# Patient Record
Sex: Male | Born: 1960 | Race: Black or African American | Hispanic: No | Marital: Single | Smoking: Current every day smoker
Health system: Southern US, Community
[De-identification: ages and names within clinical notes are randomized; demographics above are authoritative.]

## PROBLEM LIST (undated history)

## (undated) DIAGNOSIS — I10 Essential (primary) hypertension: Secondary | ICD-10-CM

## (undated) DIAGNOSIS — G40909 Epilepsy, unspecified, not intractable, without status epilepticus: Secondary | ICD-10-CM

## (undated) DIAGNOSIS — F1910 Other psychoactive substance abuse, uncomplicated: Secondary | ICD-10-CM

## (undated) DIAGNOSIS — B20 Human immunodeficiency virus [HIV] disease: Secondary | ICD-10-CM

## (undated) DIAGNOSIS — B1920 Unspecified viral hepatitis C without hepatic coma: Secondary | ICD-10-CM

## (undated) DIAGNOSIS — Q282 Arteriovenous malformation of cerebral vessels: Secondary | ICD-10-CM

## (undated) DIAGNOSIS — R569 Unspecified convulsions: Secondary | ICD-10-CM

## (undated) DIAGNOSIS — S1191XA Laceration without foreign body of unspecified part of neck, initial encounter: Secondary | ICD-10-CM

## (undated) DIAGNOSIS — Z21 Asymptomatic human immunodeficiency virus [HIV] infection status: Secondary | ICD-10-CM

## (undated) DIAGNOSIS — D849 Immunodeficiency, unspecified: Secondary | ICD-10-CM

## (undated) DIAGNOSIS — F1020 Alcohol dependence, uncomplicated: Secondary | ICD-10-CM

## (undated) DIAGNOSIS — F1010 Alcohol abuse, uncomplicated: Secondary | ICD-10-CM

## (undated) DIAGNOSIS — F1920 Other psychoactive substance dependence, uncomplicated: Secondary | ICD-10-CM

## (undated) DIAGNOSIS — F329 Major depressive disorder, single episode, unspecified: Secondary | ICD-10-CM

## (undated) HISTORY — DX: Major depressive disorder, single episode, unspecified: F32.9

## (undated) HISTORY — DX: Other psychoactive substance abuse, uncomplicated: F19.10

## (undated) HISTORY — DX: Laceration without foreign body of unspecified part of neck, initial encounter: S11.91XA

---

## 2003-08-15 ENCOUNTER — Ambulatory Visit (HOSPITAL_COMMUNITY): Admission: RE | Admit: 2003-08-15 | Discharge: 2003-08-15 | Payer: Self-pay | Admitting: Infectious Diseases

## 2003-08-15 ENCOUNTER — Encounter: Payer: Self-pay | Admitting: Infectious Diseases

## 2003-08-15 ENCOUNTER — Encounter: Admission: RE | Admit: 2003-08-15 | Discharge: 2003-08-15 | Payer: Self-pay | Admitting: Infectious Diseases

## 2003-09-24 ENCOUNTER — Emergency Department (HOSPITAL_COMMUNITY): Admission: EM | Admit: 2003-09-24 | Discharge: 2003-09-24 | Payer: Self-pay

## 2003-09-26 ENCOUNTER — Encounter: Admission: RE | Admit: 2003-09-26 | Discharge: 2003-09-26 | Payer: Self-pay | Admitting: Infectious Diseases

## 2003-11-07 ENCOUNTER — Encounter: Admission: RE | Admit: 2003-11-07 | Discharge: 2003-11-07 | Payer: Self-pay | Admitting: Infectious Diseases

## 2003-11-07 ENCOUNTER — Ambulatory Visit (HOSPITAL_COMMUNITY): Admission: RE | Admit: 2003-11-07 | Discharge: 2003-11-07 | Payer: Self-pay | Admitting: Infectious Diseases

## 2003-11-28 ENCOUNTER — Encounter: Payer: Self-pay | Admitting: Infectious Diseases

## 2003-11-28 ENCOUNTER — Encounter: Admission: RE | Admit: 2003-11-28 | Discharge: 2003-11-28 | Payer: Self-pay | Admitting: Infectious Diseases

## 2003-12-25 ENCOUNTER — Encounter: Admission: RE | Admit: 2003-12-25 | Discharge: 2003-12-25 | Payer: Self-pay | Admitting: Infectious Diseases

## 2004-01-30 ENCOUNTER — Ambulatory Visit (HOSPITAL_COMMUNITY): Admission: RE | Admit: 2004-01-30 | Discharge: 2004-01-30 | Payer: Self-pay | Admitting: Infectious Diseases

## 2004-01-30 ENCOUNTER — Encounter: Admission: RE | Admit: 2004-01-30 | Discharge: 2004-01-30 | Payer: Self-pay | Admitting: Infectious Diseases

## 2004-02-28 ENCOUNTER — Encounter: Admission: RE | Admit: 2004-02-28 | Discharge: 2004-02-28 | Payer: Self-pay | Admitting: Infectious Diseases

## 2004-04-03 ENCOUNTER — Emergency Department (HOSPITAL_COMMUNITY): Admission: EM | Admit: 2004-04-03 | Discharge: 2004-04-03 | Payer: Self-pay | Admitting: Emergency Medicine

## 2004-05-01 ENCOUNTER — Ambulatory Visit (HOSPITAL_COMMUNITY): Admission: RE | Admit: 2004-05-01 | Discharge: 2004-05-01 | Payer: Self-pay | Admitting: Infectious Diseases

## 2004-05-01 ENCOUNTER — Encounter: Admission: RE | Admit: 2004-05-01 | Discharge: 2004-05-01 | Payer: Self-pay | Admitting: Infectious Diseases

## 2004-05-15 ENCOUNTER — Ambulatory Visit: Payer: Self-pay | Admitting: Infectious Diseases

## 2004-09-02 ENCOUNTER — Ambulatory Visit: Payer: Self-pay | Admitting: Infectious Diseases

## 2004-09-02 ENCOUNTER — Ambulatory Visit (HOSPITAL_COMMUNITY): Admission: RE | Admit: 2004-09-02 | Discharge: 2004-09-02 | Payer: Self-pay | Admitting: Infectious Diseases

## 2004-09-17 ENCOUNTER — Ambulatory Visit: Payer: Self-pay | Admitting: Infectious Diseases

## 2004-12-10 ENCOUNTER — Ambulatory Visit (HOSPITAL_COMMUNITY): Admission: RE | Admit: 2004-12-10 | Discharge: 2004-12-10 | Payer: Self-pay | Admitting: Infectious Diseases

## 2004-12-10 ENCOUNTER — Ambulatory Visit: Payer: Self-pay | Admitting: Infectious Diseases

## 2004-12-24 ENCOUNTER — Ambulatory Visit: Payer: Self-pay | Admitting: Infectious Diseases

## 2005-03-08 ENCOUNTER — Ambulatory Visit (HOSPITAL_COMMUNITY): Admission: RE | Admit: 2005-03-08 | Discharge: 2005-03-08 | Payer: Self-pay | Admitting: Infectious Diseases

## 2005-03-08 ENCOUNTER — Ambulatory Visit: Payer: Self-pay | Admitting: Infectious Diseases

## 2005-03-10 ENCOUNTER — Emergency Department (HOSPITAL_COMMUNITY): Admission: EM | Admit: 2005-03-10 | Discharge: 2005-03-10 | Payer: Self-pay | Admitting: Family Medicine

## 2005-03-15 ENCOUNTER — Emergency Department (HOSPITAL_COMMUNITY): Admission: EM | Admit: 2005-03-15 | Discharge: 2005-03-15 | Payer: Self-pay | Admitting: Emergency Medicine

## 2005-03-25 ENCOUNTER — Ambulatory Visit: Payer: Self-pay | Admitting: Infectious Diseases

## 2005-04-28 ENCOUNTER — Emergency Department (HOSPITAL_COMMUNITY): Admission: EM | Admit: 2005-04-28 | Discharge: 2005-04-28 | Payer: Self-pay | Admitting: Family Medicine

## 2005-06-10 ENCOUNTER — Ambulatory Visit (HOSPITAL_COMMUNITY): Admission: RE | Admit: 2005-06-10 | Discharge: 2005-06-10 | Payer: Self-pay | Admitting: Infectious Diseases

## 2005-06-10 ENCOUNTER — Ambulatory Visit: Payer: Self-pay | Admitting: Infectious Diseases

## 2005-07-22 ENCOUNTER — Ambulatory Visit: Payer: Self-pay | Admitting: Infectious Diseases

## 2005-10-08 ENCOUNTER — Ambulatory Visit: Payer: Self-pay | Admitting: Infectious Diseases

## 2005-10-21 ENCOUNTER — Ambulatory Visit: Payer: Self-pay | Admitting: Infectious Diseases

## 2005-10-26 ENCOUNTER — Ambulatory Visit: Payer: Self-pay | Admitting: Internal Medicine

## 2005-12-14 ENCOUNTER — Encounter (INDEPENDENT_AMBULATORY_CARE_PROVIDER_SITE_OTHER): Payer: Self-pay | Admitting: *Deleted

## 2005-12-14 ENCOUNTER — Encounter: Admission: RE | Admit: 2005-12-14 | Discharge: 2005-12-14 | Payer: Self-pay | Admitting: Infectious Diseases

## 2005-12-14 ENCOUNTER — Ambulatory Visit: Payer: Self-pay | Admitting: Infectious Diseases

## 2005-12-14 LAB — CONVERTED CEMR LAB: CD4 Count: 350 microliters

## 2006-01-03 ENCOUNTER — Ambulatory Visit: Payer: Self-pay | Admitting: Infectious Diseases

## 2006-03-18 ENCOUNTER — Ambulatory Visit: Payer: Self-pay | Admitting: Family Medicine

## 2006-03-24 ENCOUNTER — Ambulatory Visit: Payer: Self-pay | Admitting: Internal Medicine

## 2006-03-25 ENCOUNTER — Ambulatory Visit: Payer: Self-pay | Admitting: *Deleted

## 2006-04-04 ENCOUNTER — Encounter: Admission: RE | Admit: 2006-04-04 | Discharge: 2006-04-04 | Payer: Self-pay | Admitting: Infectious Diseases

## 2006-04-04 ENCOUNTER — Encounter (INDEPENDENT_AMBULATORY_CARE_PROVIDER_SITE_OTHER): Payer: Self-pay | Admitting: *Deleted

## 2006-04-04 ENCOUNTER — Ambulatory Visit: Payer: Self-pay | Admitting: Infectious Diseases

## 2006-04-21 ENCOUNTER — Ambulatory Visit: Payer: Self-pay | Admitting: Infectious Diseases

## 2006-08-08 ENCOUNTER — Encounter (INDEPENDENT_AMBULATORY_CARE_PROVIDER_SITE_OTHER): Payer: Self-pay | Admitting: *Deleted

## 2006-08-08 ENCOUNTER — Ambulatory Visit: Payer: Self-pay | Admitting: Infectious Diseases

## 2006-08-08 ENCOUNTER — Encounter: Admission: RE | Admit: 2006-08-08 | Discharge: 2006-08-08 | Payer: Self-pay | Admitting: Infectious Diseases

## 2006-08-08 LAB — CONVERTED CEMR LAB
CD4 Count: 340 microliters
HIV 1 RNA Quant: 253 copies/mL

## 2006-08-25 ENCOUNTER — Ambulatory Visit: Payer: Self-pay | Admitting: Infectious Diseases

## 2006-10-31 ENCOUNTER — Encounter (INDEPENDENT_AMBULATORY_CARE_PROVIDER_SITE_OTHER): Payer: Self-pay | Admitting: *Deleted

## 2006-10-31 LAB — CONVERTED CEMR LAB

## 2006-12-02 ENCOUNTER — Emergency Department (HOSPITAL_COMMUNITY): Admission: EM | Admit: 2006-12-02 | Discharge: 2006-12-02 | Payer: Self-pay | Admitting: Emergency Medicine

## 2006-12-19 ENCOUNTER — Telehealth: Payer: Self-pay | Admitting: Infectious Diseases

## 2007-01-13 ENCOUNTER — Telehealth: Payer: Self-pay | Admitting: Infectious Diseases

## 2007-02-14 ENCOUNTER — Telehealth: Payer: Self-pay | Admitting: Infectious Diseases

## 2007-03-03 ENCOUNTER — Encounter: Admission: RE | Admit: 2007-03-03 | Discharge: 2007-03-03 | Payer: Self-pay | Admitting: Infectious Diseases

## 2007-03-03 ENCOUNTER — Ambulatory Visit: Payer: Self-pay | Admitting: Infectious Diseases

## 2007-03-17 ENCOUNTER — Telehealth: Payer: Self-pay | Admitting: Infectious Diseases

## 2007-03-28 ENCOUNTER — Ambulatory Visit: Payer: Self-pay | Admitting: Infectious Diseases

## 2007-03-28 DIAGNOSIS — I10 Essential (primary) hypertension: Secondary | ICD-10-CM | POA: Insufficient documentation

## 2007-03-28 DIAGNOSIS — Z9189 Other specified personal risk factors, not elsewhere classified: Secondary | ICD-10-CM | POA: Insufficient documentation

## 2007-03-28 DIAGNOSIS — B20 Human immunodeficiency virus [HIV] disease: Secondary | ICD-10-CM | POA: Insufficient documentation

## 2007-03-28 DIAGNOSIS — B1920 Unspecified viral hepatitis C without hepatic coma: Secondary | ICD-10-CM | POA: Insufficient documentation

## 2007-03-30 ENCOUNTER — Encounter (INDEPENDENT_AMBULATORY_CARE_PROVIDER_SITE_OTHER): Payer: Self-pay | Admitting: *Deleted

## 2007-04-07 ENCOUNTER — Encounter: Payer: Self-pay | Admitting: Infectious Diseases

## 2007-04-18 ENCOUNTER — Telehealth: Payer: Self-pay | Admitting: Infectious Diseases

## 2007-05-10 ENCOUNTER — Telehealth: Payer: Self-pay | Admitting: Infectious Diseases

## 2007-05-31 ENCOUNTER — Telehealth: Payer: Self-pay | Admitting: Infectious Diseases

## 2007-06-07 ENCOUNTER — Ambulatory Visit: Payer: Self-pay | Admitting: Infectious Diseases

## 2007-06-07 ENCOUNTER — Encounter: Admission: RE | Admit: 2007-06-07 | Discharge: 2007-06-07 | Payer: Self-pay | Admitting: Infectious Diseases

## 2007-06-07 LAB — CONVERTED CEMR LAB
AST: 77 units/L — ABNORMAL HIGH (ref 0–37)
Albumin: 4.2 g/dL (ref 3.5–5.2)
Alkaline Phosphatase: 57 units/L (ref 39–117)
Basophils Relative: 1 % (ref 0–1)
Calcium: 8.9 mg/dL (ref 8.4–10.5)
Chloride: 103 meq/L (ref 96–112)
Eosinophils Absolute: 0.1 10*3/uL (ref 0.0–0.7)
Lymphs Abs: 1.9 10*3/uL (ref 0.7–3.3)
MCHC: 33.6 g/dL (ref 30.0–36.0)
MCV: 87 fL (ref 78.0–100.0)
Monocytes Relative: 12 % — ABNORMAL HIGH (ref 3–11)
Neutro Abs: 2.1 10*3/uL (ref 1.7–7.7)
Neutrophils Relative %: 45 % (ref 43–77)
Platelets: 316 10*3/uL (ref 150–400)
Potassium: 4.2 meq/L (ref 3.5–5.3)
RBC: 4.86 M/uL (ref 4.22–5.81)
Sodium: 137 meq/L (ref 135–145)
Total Protein: 7.9 g/dL (ref 6.0–8.3)
WBC: 4.7 10*3/uL (ref 4.0–10.5)

## 2007-06-14 ENCOUNTER — Telehealth: Payer: Self-pay | Admitting: Infectious Diseases

## 2007-07-07 ENCOUNTER — Ambulatory Visit: Payer: Self-pay | Admitting: Infectious Diseases

## 2007-07-07 LAB — CONVERTED CEMR LAB
LDL Cholesterol: 120 mg/dL — ABNORMAL HIGH (ref 0–99)
Total CHOL/HDL Ratio: 6
VLDL: 39 mg/dL (ref 0–40)

## 2007-07-17 ENCOUNTER — Telehealth: Payer: Self-pay | Admitting: Infectious Diseases

## 2007-08-15 ENCOUNTER — Telehealth: Payer: Self-pay | Admitting: Infectious Diseases

## 2007-09-05 ENCOUNTER — Encounter (INDEPENDENT_AMBULATORY_CARE_PROVIDER_SITE_OTHER): Payer: Self-pay | Admitting: *Deleted

## 2007-09-15 ENCOUNTER — Telehealth: Payer: Self-pay | Admitting: Infectious Diseases

## 2007-09-18 ENCOUNTER — Encounter: Payer: Self-pay | Admitting: Infectious Diseases

## 2007-09-26 ENCOUNTER — Emergency Department (HOSPITAL_COMMUNITY): Admission: EM | Admit: 2007-09-26 | Discharge: 2007-09-26 | Payer: Self-pay | Admitting: Family Medicine

## 2007-10-17 ENCOUNTER — Telehealth: Payer: Self-pay | Admitting: Infectious Diseases

## 2007-11-13 ENCOUNTER — Encounter (INDEPENDENT_AMBULATORY_CARE_PROVIDER_SITE_OTHER): Payer: Self-pay | Admitting: *Deleted

## 2007-11-16 ENCOUNTER — Telehealth: Payer: Self-pay | Admitting: Infectious Diseases

## 2007-12-13 ENCOUNTER — Telehealth (INDEPENDENT_AMBULATORY_CARE_PROVIDER_SITE_OTHER): Payer: Self-pay | Admitting: *Deleted

## 2008-01-12 ENCOUNTER — Telehealth (INDEPENDENT_AMBULATORY_CARE_PROVIDER_SITE_OTHER): Payer: Self-pay | Admitting: *Deleted

## 2008-01-19 ENCOUNTER — Ambulatory Visit: Payer: Self-pay | Admitting: Infectious Diseases

## 2008-01-19 ENCOUNTER — Encounter: Admission: RE | Admit: 2008-01-19 | Discharge: 2008-01-19 | Payer: Self-pay | Admitting: Infectious Diseases

## 2008-01-19 LAB — CONVERTED CEMR LAB
Albumin: 4.1 g/dL (ref 3.5–5.2)
Alkaline Phosphatase: 60 units/L (ref 39–117)
Calcium: 8.8 mg/dL (ref 8.4–10.5)
Chloride: 105 meq/L (ref 96–112)
Eosinophils Absolute: 0.1 10*3/uL (ref 0.0–0.7)
Glucose, Bld: 108 mg/dL — ABNORMAL HIGH (ref 70–99)
HIV-1 RNA Quant, Log: 2.57 — ABNORMAL HIGH (ref <1.70)
LDL Cholesterol: 114 mg/dL — ABNORMAL HIGH (ref 0–99)
Lymphocytes Relative: 40 % (ref 12–46)
Lymphs Abs: 1.8 10*3/uL (ref 0.7–4.0)
MCV: 87.2 fL (ref 78.0–100.0)
Neutro Abs: 2 10*3/uL (ref 1.7–7.7)
Neutrophils Relative %: 46 % (ref 43–77)
Platelets: 284 10*3/uL (ref 150–400)
Potassium: 4.1 meq/L (ref 3.5–5.3)
RBC: 4.93 M/uL (ref 4.22–5.81)
Sodium: 140 meq/L (ref 135–145)
Total Protein: 7.8 g/dL (ref 6.0–8.3)
Triglycerides: 199 mg/dL — ABNORMAL HIGH (ref <150)
WBC: 4.5 10*3/uL (ref 4.0–10.5)

## 2008-02-08 ENCOUNTER — Ambulatory Visit: Payer: Self-pay | Admitting: Infectious Diseases

## 2008-02-09 ENCOUNTER — Telehealth (INDEPENDENT_AMBULATORY_CARE_PROVIDER_SITE_OTHER): Payer: Self-pay | Admitting: *Deleted

## 2008-03-07 ENCOUNTER — Telehealth (INDEPENDENT_AMBULATORY_CARE_PROVIDER_SITE_OTHER): Payer: Self-pay | Admitting: *Deleted

## 2008-04-08 ENCOUNTER — Encounter (INDEPENDENT_AMBULATORY_CARE_PROVIDER_SITE_OTHER): Payer: Self-pay | Admitting: *Deleted

## 2008-04-10 ENCOUNTER — Telehealth (INDEPENDENT_AMBULATORY_CARE_PROVIDER_SITE_OTHER): Payer: Self-pay | Admitting: *Deleted

## 2008-05-07 ENCOUNTER — Telehealth (INDEPENDENT_AMBULATORY_CARE_PROVIDER_SITE_OTHER): Payer: Self-pay | Admitting: *Deleted

## 2008-05-15 ENCOUNTER — Ambulatory Visit: Payer: Self-pay | Admitting: Infectious Diseases

## 2008-05-15 LAB — CONVERTED CEMR LAB
Albumin: 4.3 g/dL (ref 3.5–5.2)
CO2: 24 meq/L (ref 19–32)
Calcium: 9.3 mg/dL (ref 8.4–10.5)
Glucose, Bld: 91 mg/dL (ref 70–99)
HCT: 44.2 % (ref 39.0–52.0)
HIV 1 RNA Quant: 200 copies/mL — ABNORMAL HIGH (ref <50)
HIV-1 RNA Quant, Log: 2.3 — ABNORMAL HIGH (ref <1.70)
Lymphocytes Relative: 41 % (ref 12–46)
Lymphs Abs: 1.9 10*3/uL (ref 0.7–4.0)
Neutrophils Relative %: 44 % (ref 43–77)
Platelets: 274 10*3/uL (ref 150–400)
Potassium: 4.4 meq/L (ref 3.5–5.3)
RBC: 5.1 M/uL (ref 4.22–5.81)
Sodium: 140 meq/L (ref 135–145)
Total Protein: 8.1 g/dL (ref 6.0–8.3)
WBC: 4.7 10*3/uL (ref 4.0–10.5)

## 2008-05-30 ENCOUNTER — Ambulatory Visit: Payer: Self-pay | Admitting: Infectious Diseases

## 2008-06-03 ENCOUNTER — Telehealth: Payer: Self-pay | Admitting: Infectious Diseases

## 2008-06-06 ENCOUNTER — Telehealth (INDEPENDENT_AMBULATORY_CARE_PROVIDER_SITE_OTHER): Payer: Self-pay | Admitting: *Deleted

## 2008-06-24 ENCOUNTER — Telehealth: Payer: Self-pay | Admitting: Infectious Diseases

## 2008-07-04 ENCOUNTER — Telehealth (INDEPENDENT_AMBULATORY_CARE_PROVIDER_SITE_OTHER): Payer: Self-pay | Admitting: *Deleted

## 2008-07-17 ENCOUNTER — Telehealth (INDEPENDENT_AMBULATORY_CARE_PROVIDER_SITE_OTHER): Payer: Self-pay | Admitting: *Deleted

## 2008-07-20 ENCOUNTER — Emergency Department (HOSPITAL_COMMUNITY): Admission: EM | Admit: 2008-07-20 | Discharge: 2008-07-20 | Payer: Self-pay | Admitting: Emergency Medicine

## 2008-08-07 ENCOUNTER — Telehealth (INDEPENDENT_AMBULATORY_CARE_PROVIDER_SITE_OTHER): Payer: Self-pay | Admitting: *Deleted

## 2008-09-03 ENCOUNTER — Telehealth (INDEPENDENT_AMBULATORY_CARE_PROVIDER_SITE_OTHER): Payer: Self-pay | Admitting: *Deleted

## 2008-09-17 ENCOUNTER — Telehealth (INDEPENDENT_AMBULATORY_CARE_PROVIDER_SITE_OTHER): Payer: Self-pay | Admitting: *Deleted

## 2008-10-04 ENCOUNTER — Telehealth (INDEPENDENT_AMBULATORY_CARE_PROVIDER_SITE_OTHER): Payer: Self-pay | Admitting: *Deleted

## 2008-10-07 ENCOUNTER — Emergency Department (HOSPITAL_COMMUNITY): Admission: EM | Admit: 2008-10-07 | Discharge: 2008-10-07 | Payer: Self-pay | Admitting: Emergency Medicine

## 2008-10-09 ENCOUNTER — Emergency Department (HOSPITAL_COMMUNITY): Admission: EM | Admit: 2008-10-09 | Discharge: 2008-10-09 | Payer: Self-pay | Admitting: Emergency Medicine

## 2008-10-09 ENCOUNTER — Encounter (INDEPENDENT_AMBULATORY_CARE_PROVIDER_SITE_OTHER): Payer: Self-pay | Admitting: *Deleted

## 2008-10-22 ENCOUNTER — Encounter (INDEPENDENT_AMBULATORY_CARE_PROVIDER_SITE_OTHER): Payer: Self-pay | Admitting: Licensed Clinical Social Worker

## 2008-10-22 ENCOUNTER — Ambulatory Visit: Payer: Self-pay | Admitting: Infectious Diseases

## 2008-10-22 LAB — CONVERTED CEMR LAB
ALT: 62 units/L — ABNORMAL HIGH (ref 0–53)
Basophils Absolute: 0 10*3/uL (ref 0.0–0.1)
CO2: 25 meq/L (ref 19–32)
Eosinophils Relative: 2 % (ref 0–5)
HIV 1 RNA Quant: 223 copies/mL — ABNORMAL HIGH (ref <48)
Lymphocytes Relative: 39 % (ref 12–46)
Neutro Abs: 2.2 10*3/uL (ref 1.7–7.7)
Platelets: 356 10*3/uL (ref 150–400)
RDW: 12.5 % (ref 11.5–15.5)
Sodium: 138 meq/L (ref 135–145)
Total Bilirubin: 0.4 mg/dL (ref 0.3–1.2)
Total Protein: 8.1 g/dL (ref 6.0–8.3)

## 2008-10-31 ENCOUNTER — Telehealth (INDEPENDENT_AMBULATORY_CARE_PROVIDER_SITE_OTHER): Payer: Self-pay | Admitting: *Deleted

## 2008-11-07 ENCOUNTER — Ambulatory Visit: Payer: Self-pay | Admitting: Infectious Diseases

## 2008-11-27 ENCOUNTER — Telehealth (INDEPENDENT_AMBULATORY_CARE_PROVIDER_SITE_OTHER): Payer: Self-pay | Admitting: *Deleted

## 2008-11-27 ENCOUNTER — Emergency Department (HOSPITAL_COMMUNITY): Admission: EM | Admit: 2008-11-27 | Discharge: 2008-11-27 | Payer: Self-pay | Admitting: Family Medicine

## 2008-12-30 ENCOUNTER — Telehealth (INDEPENDENT_AMBULATORY_CARE_PROVIDER_SITE_OTHER): Payer: Self-pay | Admitting: *Deleted

## 2009-01-03 ENCOUNTER — Telehealth (INDEPENDENT_AMBULATORY_CARE_PROVIDER_SITE_OTHER): Payer: Self-pay | Admitting: *Deleted

## 2009-01-09 ENCOUNTER — Telehealth (INDEPENDENT_AMBULATORY_CARE_PROVIDER_SITE_OTHER): Payer: Self-pay | Admitting: *Deleted

## 2009-01-30 ENCOUNTER — Telehealth (INDEPENDENT_AMBULATORY_CARE_PROVIDER_SITE_OTHER): Payer: Self-pay | Admitting: *Deleted

## 2009-03-04 ENCOUNTER — Telehealth (INDEPENDENT_AMBULATORY_CARE_PROVIDER_SITE_OTHER): Payer: Self-pay | Admitting: *Deleted

## 2009-03-18 ENCOUNTER — Telehealth (INDEPENDENT_AMBULATORY_CARE_PROVIDER_SITE_OTHER): Payer: Self-pay | Admitting: *Deleted

## 2009-03-24 ENCOUNTER — Telehealth (INDEPENDENT_AMBULATORY_CARE_PROVIDER_SITE_OTHER): Payer: Self-pay | Admitting: *Deleted

## 2009-03-25 ENCOUNTER — Telehealth (INDEPENDENT_AMBULATORY_CARE_PROVIDER_SITE_OTHER): Payer: Self-pay | Admitting: *Deleted

## 2009-04-24 ENCOUNTER — Telehealth (INDEPENDENT_AMBULATORY_CARE_PROVIDER_SITE_OTHER): Payer: Self-pay | Admitting: *Deleted

## 2009-04-25 ENCOUNTER — Telehealth: Payer: Self-pay | Admitting: Infectious Diseases

## 2009-04-28 ENCOUNTER — Telehealth (INDEPENDENT_AMBULATORY_CARE_PROVIDER_SITE_OTHER): Payer: Self-pay | Admitting: *Deleted

## 2009-05-14 ENCOUNTER — Encounter (INDEPENDENT_AMBULATORY_CARE_PROVIDER_SITE_OTHER): Payer: Self-pay | Admitting: *Deleted

## 2009-05-23 ENCOUNTER — Telehealth (INDEPENDENT_AMBULATORY_CARE_PROVIDER_SITE_OTHER): Payer: Self-pay | Admitting: *Deleted

## 2009-06-19 ENCOUNTER — Ambulatory Visit: Payer: Self-pay | Admitting: Infectious Diseases

## 2009-06-19 LAB — CONVERTED CEMR LAB
AST: 86 units/L — ABNORMAL HIGH (ref 0–37)
BUN: 16 mg/dL (ref 6–23)
Basophils Absolute: 0 10*3/uL (ref 0.0–0.1)
Basophils Relative: 1 % (ref 0–1)
CO2: 22 meq/L (ref 19–32)
Calcium: 8.7 mg/dL (ref 8.4–10.5)
Chloride: 105 meq/L (ref 96–112)
Creatinine, Ser: 1 mg/dL (ref 0.40–1.50)
Eosinophils Absolute: 0.1 10*3/uL (ref 0.0–0.7)
Glucose, Bld: 74 mg/dL (ref 70–99)
HIV 1 RNA Quant: 48 copies/mL — ABNORMAL HIGH (ref <48)
HIV-1 RNA Quant, Log: 1.68 — ABNORMAL HIGH (ref <1.68)
MCHC: 31.6 g/dL (ref 30.0–36.0)
MCV: 89.3 fL (ref >=78.0)
Neutro Abs: 2.2 10*3/uL (ref 1.7–7.7)
Neutrophils Relative %: 46 % (ref 43–77)
Platelets: 297 10*3/uL (ref 150–400)
RDW: 12.6 % (ref 11.5–15.5)
Total CHOL/HDL Ratio: 4.8

## 2009-06-20 ENCOUNTER — Telehealth (INDEPENDENT_AMBULATORY_CARE_PROVIDER_SITE_OTHER): Payer: Self-pay | Admitting: *Deleted

## 2009-06-25 ENCOUNTER — Telehealth (INDEPENDENT_AMBULATORY_CARE_PROVIDER_SITE_OTHER): Payer: Self-pay | Admitting: *Deleted

## 2009-07-03 ENCOUNTER — Ambulatory Visit: Payer: Self-pay | Admitting: Infectious Diseases

## 2009-07-23 ENCOUNTER — Telehealth (INDEPENDENT_AMBULATORY_CARE_PROVIDER_SITE_OTHER): Payer: Self-pay | Admitting: *Deleted

## 2009-08-22 ENCOUNTER — Telehealth: Payer: Self-pay | Admitting: Infectious Diseases

## 2009-08-22 ENCOUNTER — Telehealth (INDEPENDENT_AMBULATORY_CARE_PROVIDER_SITE_OTHER): Payer: Self-pay | Admitting: *Deleted

## 2009-09-15 ENCOUNTER — Telehealth (INDEPENDENT_AMBULATORY_CARE_PROVIDER_SITE_OTHER): Payer: Self-pay | Admitting: *Deleted

## 2009-09-23 ENCOUNTER — Encounter (INDEPENDENT_AMBULATORY_CARE_PROVIDER_SITE_OTHER): Payer: Self-pay | Admitting: *Deleted

## 2009-10-10 ENCOUNTER — Telehealth (INDEPENDENT_AMBULATORY_CARE_PROVIDER_SITE_OTHER): Payer: Self-pay | Admitting: *Deleted

## 2009-10-14 ENCOUNTER — Emergency Department (HOSPITAL_COMMUNITY): Admission: EM | Admit: 2009-10-14 | Discharge: 2009-10-14 | Payer: Self-pay | Admitting: Family Medicine

## 2009-10-24 ENCOUNTER — Telehealth (INDEPENDENT_AMBULATORY_CARE_PROVIDER_SITE_OTHER): Payer: Self-pay | Admitting: *Deleted

## 2009-11-06 ENCOUNTER — Ambulatory Visit: Payer: Self-pay | Admitting: Infectious Diseases

## 2009-11-06 ENCOUNTER — Telehealth (INDEPENDENT_AMBULATORY_CARE_PROVIDER_SITE_OTHER): Payer: Self-pay | Admitting: *Deleted

## 2009-11-06 LAB — CONVERTED CEMR LAB
ALT: 82 units/L — ABNORMAL HIGH (ref 0–53)
Alkaline Phosphatase: 61 units/L (ref 39–117)
Basophils Relative: 1 % (ref 0–1)
CO2: 25 meq/L (ref 19–32)
Lymphocytes Relative: 41 % (ref 12–46)
MCHC: 31.4 g/dL (ref 30.0–36.0)
Monocytes Relative: 10 % (ref 3–12)
Neutro Abs: 2 10*3/uL (ref 1.7–7.7)
Neutrophils Relative %: 46 % (ref 43–77)
RBC: 4.72 M/uL (ref 4.22–5.81)
Sodium: 137 meq/L (ref 135–145)
Total Bilirubin: 0.4 mg/dL (ref 0.3–1.2)
Total Protein: 7.8 g/dL (ref 6.0–8.3)
WBC: 4.4 10*3/uL (ref 4.0–10.5)

## 2009-11-20 ENCOUNTER — Ambulatory Visit: Payer: Self-pay | Admitting: Infectious Diseases

## 2009-11-25 ENCOUNTER — Emergency Department (HOSPITAL_COMMUNITY): Admission: EM | Admit: 2009-11-25 | Discharge: 2009-11-25 | Payer: Self-pay | Admitting: Family Medicine

## 2009-11-27 ENCOUNTER — Telehealth (INDEPENDENT_AMBULATORY_CARE_PROVIDER_SITE_OTHER): Payer: Self-pay | Admitting: *Deleted

## 2009-12-11 ENCOUNTER — Telehealth (INDEPENDENT_AMBULATORY_CARE_PROVIDER_SITE_OTHER): Payer: Self-pay | Admitting: *Deleted

## 2009-12-15 ENCOUNTER — Telehealth (INDEPENDENT_AMBULATORY_CARE_PROVIDER_SITE_OTHER): Payer: Self-pay | Admitting: *Deleted

## 2009-12-31 ENCOUNTER — Telehealth (INDEPENDENT_AMBULATORY_CARE_PROVIDER_SITE_OTHER): Payer: Self-pay | Admitting: *Deleted

## 2010-01-28 ENCOUNTER — Telehealth (INDEPENDENT_AMBULATORY_CARE_PROVIDER_SITE_OTHER): Payer: Self-pay | Admitting: *Deleted

## 2010-02-10 ENCOUNTER — Telehealth (INDEPENDENT_AMBULATORY_CARE_PROVIDER_SITE_OTHER): Payer: Self-pay | Admitting: *Deleted

## 2010-02-16 ENCOUNTER — Telehealth (INDEPENDENT_AMBULATORY_CARE_PROVIDER_SITE_OTHER): Payer: Self-pay | Admitting: *Deleted

## 2010-02-25 ENCOUNTER — Encounter (INDEPENDENT_AMBULATORY_CARE_PROVIDER_SITE_OTHER): Payer: Self-pay | Admitting: *Deleted

## 2010-02-25 ENCOUNTER — Ambulatory Visit: Payer: Self-pay | Admitting: Infectious Diseases

## 2010-02-25 LAB — CONVERTED CEMR LAB
Albumin: 4.1 g/dL (ref 3.5–5.2)
Alkaline Phosphatase: 53 units/L (ref 39–117)
BUN: 22 mg/dL (ref 6–23)
Basophils Absolute: 0 10*3/uL (ref 0.0–0.1)
Basophils Relative: 1 % (ref 0–1)
Eosinophils Relative: 2 % (ref 0–5)
Glucose, Bld: 86 mg/dL (ref 70–99)
HCT: 39.4 % (ref 39.0–52.0)
HDL: 35 mg/dL — ABNORMAL LOW (ref >39)
Hemoglobin: 13 g/dL (ref 13.0–17.0)
LDL Cholesterol: 117 mg/dL — ABNORMAL HIGH (ref 0–99)
MCHC: 33 g/dL (ref 30.0–36.0)
Monocytes Absolute: 0.5 10*3/uL (ref 0.1–1.0)
RDW: 13 % (ref 11.5–15.5)
Total Bilirubin: 0.5 mg/dL (ref 0.3–1.2)
Triglycerides: 152 mg/dL — ABNORMAL HIGH (ref <150)
VLDL: 30 mg/dL (ref 0–40)

## 2010-03-10 ENCOUNTER — Telehealth (INDEPENDENT_AMBULATORY_CARE_PROVIDER_SITE_OTHER): Payer: Self-pay | Admitting: *Deleted

## 2010-03-12 ENCOUNTER — Ambulatory Visit: Payer: Self-pay | Admitting: Infectious Diseases

## 2010-03-20 LAB — CONVERTED CEMR LAB: Testosterone: 505.37 ng/dL (ref 350–890)

## 2010-04-03 ENCOUNTER — Telehealth (INDEPENDENT_AMBULATORY_CARE_PROVIDER_SITE_OTHER): Payer: Self-pay | Admitting: *Deleted

## 2010-05-05 ENCOUNTER — Telehealth (INDEPENDENT_AMBULATORY_CARE_PROVIDER_SITE_OTHER): Payer: Self-pay | Admitting: *Deleted

## 2010-05-13 ENCOUNTER — Telehealth (INDEPENDENT_AMBULATORY_CARE_PROVIDER_SITE_OTHER): Payer: Self-pay | Admitting: *Deleted

## 2010-06-01 ENCOUNTER — Telehealth (INDEPENDENT_AMBULATORY_CARE_PROVIDER_SITE_OTHER): Payer: Self-pay | Admitting: *Deleted

## 2010-06-08 ENCOUNTER — Encounter: Payer: Self-pay | Admitting: Infectious Diseases

## 2010-06-08 ENCOUNTER — Encounter (INDEPENDENT_AMBULATORY_CARE_PROVIDER_SITE_OTHER): Payer: Self-pay | Admitting: *Deleted

## 2010-06-15 ENCOUNTER — Ambulatory Visit: Payer: Self-pay | Admitting: Infectious Diseases

## 2010-06-15 LAB — CONVERTED CEMR LAB
BUN: 22 mg/dL (ref 6–23)
Basophils Absolute: 0 10*3/uL (ref 0.0–0.1)
CO2: 27 meq/L (ref 19–32)
Creatinine, Ser: 1 mg/dL (ref 0.40–1.50)
Eosinophils Relative: 3 % (ref 0–5)
Glucose, Bld: 80 mg/dL (ref 70–99)
HCT: 39.6 % (ref 39.0–52.0)
HIV 1 RNA Quant: <20 copies/mL (ref <20)
HIV-1 RNA Quant, Log: <1.3 (ref <1.30)
Hemoglobin: 13 g/dL (ref 13.0–17.0)
Lymphocytes Relative: 54 % — ABNORMAL HIGH (ref 12–46)
Monocytes Absolute: 0.5 10*3/uL (ref 0.1–1.0)
Monocytes Relative: 11 % (ref 3–12)
RDW: 12.2 % (ref 11.5–15.5)
Total Bilirubin: 0.5 mg/dL (ref 0.3–1.2)

## 2010-06-23 ENCOUNTER — Encounter: Payer: Self-pay | Admitting: Infectious Diseases

## 2010-06-29 ENCOUNTER — Telehealth (INDEPENDENT_AMBULATORY_CARE_PROVIDER_SITE_OTHER): Payer: Self-pay | Admitting: *Deleted

## 2010-07-02 ENCOUNTER — Ambulatory Visit: Payer: Self-pay | Admitting: Infectious Diseases

## 2010-07-17 ENCOUNTER — Telehealth (INDEPENDENT_AMBULATORY_CARE_PROVIDER_SITE_OTHER): Payer: Self-pay | Admitting: *Deleted

## 2010-07-17 ENCOUNTER — Ambulatory Visit: Payer: Self-pay | Admitting: Infectious Diseases

## 2010-07-27 ENCOUNTER — Encounter (INDEPENDENT_AMBULATORY_CARE_PROVIDER_SITE_OTHER): Payer: Self-pay | Admitting: *Deleted

## 2010-08-24 ENCOUNTER — Telehealth: Payer: Self-pay | Admitting: *Deleted

## 2010-09-17 ENCOUNTER — Ambulatory Visit: Admit: 2010-09-17 | Payer: Self-pay | Admitting: Infectious Diseases

## 2010-09-28 ENCOUNTER — Encounter (INDEPENDENT_AMBULATORY_CARE_PROVIDER_SITE_OTHER): Payer: Self-pay | Admitting: *Deleted

## 2010-10-04 ENCOUNTER — Emergency Department (HOSPITAL_COMMUNITY)
Admission: EM | Admit: 2010-10-04 | Discharge: 2010-10-04 | Payer: Federal, State, Local not specified - Other | Source: Home / Self Care | Admitting: Emergency Medicine

## 2010-10-04 LAB — CONVERTED CEMR LAB
ALT: 153 units/L — ABNORMAL HIGH (ref 0–53)
ALT: 200 units/L — ABNORMAL HIGH (ref 0–53)
BUN: 15 mg/dL (ref 6–23)
Basophils Absolute: 0 10*3/uL (ref 0.0–0.1)
Basophils Relative: 1 % (ref 0–1)
CO2: 24 meq/L (ref 19–32)
CO2: 25 meq/L (ref 19–32)
Calcium: 9.2 mg/dL (ref 8.4–10.5)
Chloride: 105 meq/L (ref 96–112)
Creatinine, Ser: 1.07 mg/dL (ref 0.40–1.50)
Creatinine, Ser: 1.08 mg/dL (ref 0.40–1.50)
Eosinophils Relative: 2 % (ref 0–5)
Glucose, Bld: 106 mg/dL — ABNORMAL HIGH (ref 70–99)
HCT: 45.8 % (ref 39.0–52.0)
HIV 1 RNA Quant: 253 copies/mL — ABNORMAL HIGH (ref <50)
HIV 1 RNA Quant: <50 copies/mL (ref <50)
HIV-1 RNA Quant, Log: <1.7 (ref <1.70)
Hemoglobin: 14.5 g/dL (ref 13.9–16.8)
Hemoglobin: 15 g/dL (ref 13.0–17.0)
Lymphocytes Relative: 42 % (ref 12–46)
Lymphs Abs: 1.9 10*3/uL (ref 0.7–3.3)
Lymphs Abs: 2 10*3/uL (ref 0.8–3.1)
MCHC: 32.7 g/dL — ABNORMAL LOW (ref 33.1–35.4)
MCV: 87.9 fL (ref 78.8–100.0)
Monocytes Absolute: 0.4 10*3/uL (ref 0.2–0.7)
Monocytes Absolute: 0.4 10*3/uL (ref 0.2–0.7)
Monocytes Relative: 8 % (ref 3–11)
Monocytes Relative: 9 % (ref 3–11)
Neutro Abs: 1.6 10*3/uL — ABNORMAL LOW (ref 1.8–6.8)
Neutro Abs: 2.2 10*3/uL (ref 1.7–7.7)
RBC: 5.05 M/uL (ref 4.20–5.50)
RBC: 5.3 M/uL (ref 4.22–5.81)
Total Bilirubin: 0.5 mg/dL (ref 0.3–1.2)
Total Bilirubin: 0.5 mg/dL (ref 0.3–1.2)
WBC: 4.6 10*3/uL (ref 4.0–10.5)

## 2010-10-06 NOTE — Progress Notes (Signed)
Summary: Cough w/ phlegm, RN advised OTC Mucinex or Robitussin  Phone Note Call from Patient Call back at [facility] Phone [phone]   Caller: Patient Reason for Call: Acute Illness Summary of Call: Message left by pt.  Having "phlegm" following respiratory illness.  Wondering whether he should be taking septra or bactrim.  RN returned call.  Message left for pt. to obtain OTC Mucinex or Robitussin and take per the package instructions.  RN advised also advised increasing the amount of fluids he is drinking to keep secretions thin and easy to expectorate. Jennet Maduro RN  October 24, 2009 4:33 PM

## 2010-10-06 NOTE — Miscellaneous (Signed)
Summary: clinical update/ryan white [place]ADAP approved til 12/05/10  Clinical Lists Changes  Observations: Added new observation of AIDSDAP: Yes 2011 (09/23/2009 11:46)

## 2010-10-06 NOTE — Progress Notes (Signed)
Summary: [place]ADAP/pt assist med arrived for Jul  Phone Note Refill Request      Prescriptions: ATRIPLA 600-200-300 MG TABS (EFAVIRENZ-EMTRICITAB-TENOFOVIR) Take 1 tablet by mouth at bedtime  #30 x 0   Entered by:   Paulo Fruit  BS,CPht II,MPH   Authorized by:   Lina Sayre MD   Signed by:   Paulo Fruit  BS,CPht II,MPH on 03/10/2010   Method used:   Samples Given   RxID:   [id number]  Patient Assist Medication Verification: Medication name: Atripla RX # [id number] Tech approval:MLD Tried to contact patient.  Was unable to get through. Could not leave a message because no answering machine came on. Paulo Fruit  BS,CPht II,MPH  March 10, 2010 2:16 PM

## 2010-10-06 NOTE — Miscellaneous (Signed)
Summary: viagra thru [employer]  Clinical Lists Changes found app dated 08/28/09 for [employer] to get free Viagra. presumably he will have to apply again before 12/11 to continue getting the meds.Golden Circle RN  June 23, 2010 7:51 AM

## 2010-10-06 NOTE — Letter (Signed)
Summary: Juanell Fairly Application  Ryan White Application   Imported By: Florinda Marker 06/12/2010 16:18:57  _____________________________________________________________________  External Attachment:    Type:   Image     Comment:   External Document

## 2010-10-06 NOTE — Progress Notes (Signed)
Summary: NCADAP/pt assist med arrived for Apr/May  Phone Note Refill Request      Prescriptions: ATRIPLA 600-200-300 MG TABS (EFAVIRENZ-EMTRICITAB-TENOFOVIR) Take 1 tablet by mouth at bedtime  #30 x 0   Entered by:   Paulo Fruit  BS,CPht II,MPH   Authorized by:   Lina Sayre MD   Signed by:   Paulo Fruit  BS,CPht II,MPH on 12/31/2009   Method used:   Samples Given   RxID:   [id number]   Patient Assist Medication Verification: Medication: Atripla Lot# [id number] Exp Date:08 2013 Tech approval:MLD tried to contact patient at number on file. Unable to leave a message. No answering machine available at the time of the call. Paulo Fruit  BS,CPht II,MPH  December 31, 2009 3:13 PM

## 2010-10-06 NOTE — Progress Notes (Signed)
Summary: Patient assistance med arrived via PAP 3 months  Phone Note Refill Request      Prescriptions: VIAGRA 100 MG TABS (SILDENAFIL CITRATE) Take as directed  #30 x 0   Entered by:   Paulo Fruit  BS,CPht II,MPH   Authorized by:   Lina Sayre MD   Signed by:   Paulo Fruit  BS,CPht II,MPH on 09/15/2009   Method used:   Samples Given   RxID:   [id number]   Patient Assist Medication Verification: Medication: Viagra 100mg  FAO#[id number] Exp Date:01 Aug 15 Tech approval:MLD **Patient called right when I was about to call him**  He stated he will be here today to pickup at 3pm Paulo Fruit  BS,CPht II,MPH  September 15, 2009 2:15 PM

## 2010-10-06 NOTE — Miscellaneous (Signed)
Summary: refill - B/P rx  Clinical Lists Changes  Medications: Removed medication of BENAZEPRIL-HYDROCHLOROTHIAZIDE 10-12.5 MG TABS (BENAZEPRIL-HYDROCHLOROTHIAZIDE) Take 1 tablet by mouth once a day Added new medication of BENAZEPRIL HCL 10 MG TABS (BENAZEPRIL HCL) Take 1 tablet by mouth once a day - Signed Added new medication of HYDROCHLOROTHIAZIDE 12.5 MG CAPS (HYDROCHLOROTHIAZIDE) Take 1 capsule by mouth once a day - Signed Rx of HYDROCHLOROTHIAZIDE 12.5 MG CAPS (HYDROCHLOROTHIAZIDE) Take 1 capsule by mouth once a day;  #30 x 11;  Signed;  Entered by: Jennet Maduro RN;  Authorized by: Lina Sayre MD;  Method used: Telephoned to [facility], [address], [place], [place]  [address], Ph: [phone], Fax: [phone] Rx of BENAZEPRIL HCL 10 MG TABS (BENAZEPRIL HCL) Take 1 tablet by mouth once a day;  #30 x 11;  Signed;  Entered by: Jennet Maduro RN;  Authorized by: Lina Sayre MD;  Method used: Telephoned to [facility], [address], [place], [place]  [address], Ph: [phone], Fax: [phone]    Prescriptions: BENAZEPRIL HCL 10 MG TABS (BENAZEPRIL HCL) Take 1 tablet by mouth once a day  #30 x 11   Entered by:   Jennet Maduro RN   Authorized by:   Lina Sayre MD   Signed by:   Jennet Maduro RN on 02/25/2010   Method used:   Telephoned to ...       [place] [facility] (retail)       [address]       [place], [place]  [address]       Ph: [phone]       Fax: [phone]   RxID:   [id number] HYDROCHLOROTHIAZIDE 12.5 MG CAPS (HYDROCHLOROTHIAZIDE) Take 1 capsule by mouth once a day  #30 x 11   Entered by:   Jennet Maduro RN   Authorized by:   Lina Sayre MD   Signed by:   Jennet Maduro RN on 02/25/2010   Method used:   Telephoned to ...       [facility] [facility] (retail)       [address]       [place], [place]   [address]       Ph: [phone]       Fax: [phone]   RxID:   [id number]

## 2010-10-06 NOTE — Progress Notes (Signed)
Summary: Pt. notified ADAP med. arrived  Phone Note Outgoing Call   Call placed by: Annice Pih Summary of Call: Pt. notified that ADAP med. arrived for October, Atripla Initial call taken by: Wendall Mola CMA Duncan Dull),  June 29, 2010 11:26 AM     Appended Document: Pt. notified ADAP med. arrived pt. picked up ADAP meds

## 2010-10-06 NOTE — Progress Notes (Signed)
Summary: NCADAP/pt assist med arrived for May  Phone Note Refill Request      Prescriptions: ATRIPLA 600-200-300 MG TABS (EFAVIRENZ-EMTRICITAB-TENOFOVIR) Take 1 tablet by mouth at bedtime  #30 x 0   Entered by:   Paulo Fruit  BS,CPht II,MPH   Authorized by:   Lina Sayre MD   Signed by:   Paulo Fruit  BS,CPht II,MPH on 01/28/2010   Method used:   Samples Given   RxID:   [id number]   Patient Assist Medication Verification: Medication: Atripla Lot# [id number] Exp Date:10 2013 Tech approval:MLD Call placed to patient with message that assistance medications are ready for pick-up. Paulo Fruit  BS,CPht II,MPH  Jan 28, 2010 11:37 AM

## 2010-10-06 NOTE — Assessment & Plan Note (Signed)
Summary: F/U/VS   CC:  follow-up visit, right arm shoulder pain, wrist pain, shooting electrical pains, and difficulty holding  cups in his right hand at times.  History of Present Illness: Isaac [name] is doing well overall and adherent to his ARVs. He has been off his BPmeds for one week buthis BP is 140/90 today and will restart in few days. HIV 155 and CD4 400. Although not suppressed he is stable over time and possibly best that can be done. His LFTs are just slightly elevated and will await newer HCV drugs that are under study. Isaac [name] has had tingling in his forearms bilaterally over the past month with some discomfort but onlly has occurred few times. I amnot sure if this is neuropathy or not or muscular cramps. Will follow this complaint at next visit.   Preventive Screening-Counseling & Management  Alcohol-Tobacco     Alcohol drinks/day: 0     Smoking Status: quit     Year Quit: 2007     Pack years: 1 ppd     Passive Smoke Exposure: no  Caffeine-Diet-Exercise     Caffeine use/day: 4     Does Patient Exercise: no  Hep-HIV-STD-Contraception     HIV Risk: no     HIV Risk Counseling: not indicated-no HIV risk noted  Safety-Violence-Falls     Seat Belt Use: 90  Comments: condoms declined      Sexual History:  currently monogamous.        Drug Use:  former.     Current Allergies (reviewed today): No known allergies  Social History: Sexual History:  currently monogamous Drug Use:  former  Vital Signs:  Patient profile:   50 year old male Height:      69 inches (175.26 cm) Weight:      188.1 pounds (85.50 kg) BMI:     27.88 Temp:     97.5 degrees F (36.39 degrees C) oral Pulse rate:   81 / minute BP sitting:   146 / 90  (left arm) Cuff size:   regular  Vitals Entered By: Jennet Maduro RN (November 20, 2009 10:52 AM) CC: follow-up visit, right arm shoulder pain, wrist pain, shooting electrical pains, difficulty holding  cups in his right hand at times Is Patient  Diabetic? No Pain Assessment Patient in pain? yes     Location: right shoulder and wrist Intensity: 3 Type: shooting, electrical pain Onset of pain  Intermittent Nutritional Status BMI of 25 - 29 = overweight Nutritional Status Detail appetite "normal"  Have you ever been in a relationship where you felt threatened, hurt or afraid?No   Does patient need assistance? Functional Status Self care Ambulation Normal Comments rarely miss a dose of Atripla   Physical Exam  General:  alert, well-developed, and well-nourished.   Eyes:  No corneal or conjunctival inflammation noted. EOMI. Perrla. Funduscopic exam benign, without hemorrhages, exudates or papilledema. Vision grossly normal. Mouth:  Oral mucosa and oropharynx without lesions or exudates.  Teeth in good repair. Neck:  No deformities, masses, or tenderness noted. Lungs:  Normal respiratory effort, chest expands symmetrically. Lungs are clear to auscultation, no crackles or wheezes. Heart:  Normal rate and regular rhythm. S1 and S2 normal without gallop, murmur, click, rub or other extra sounds. Abdomen:  Bowel sounds positive,abdomen soft and non-tender without masses, organomegaly or hernias noted.   Impression & Recommendations:  Problem # 1:  HIV DISEASE (ICD-042)  Orders: Est. Patient Level IV ([address]) Est. Patient Level IV ([address])Future Orders:  T-CBC w/Diff [phone]) ... 03/23/2010 T-CD4SP ([facility]) (CD4SP) ... 03/23/2010 T-Comprehensive Metabolic Panel [id number]) ... 03/23/2010 T-HIV Viral Load [phone]) ... 03/23/2010 T-RPR (Syphilis) [phone]) ... 03/23/2010  Other Orders: Future Orders: T-Lipid Profile ([id number]) ... 03/23/2010  Patient Instructions: 1)  Please schedule a follow-up appointment in 4-5 months. 2)  Be sure to return for lab work one (2) week before your next appointment as scheduled. 3)  Dr. Lina Sayre Prescriptions: VIAGRA 100 MG TABS (SILDENAFIL CITRATE) Take as directed   #30 x 0   Entered and Authorized by:   Lina Sayre MD   Signed by:   Lina Sayre MD on 11/20/2009   Method used:   Print then Give to Patient   RxID:   [id number]  Process Orders Check Orders Results:     [facility]: ABN not required for this insurance Tests Sent for requisitioning (November 20, 2009 11:53 AM):     03/23/2010: [facility] -- T-CBC w/Diff [[id number]] (signed)     03/23/2010: [facility] -- T-Comprehensive Metabolic Panel [[id number]] (signed)     03/23/2010: [facility] -- T-HIV Viral Load [phone] (signed)     03/23/2010: [facility] -- T-RPR (Syphilis) [phone] (signed)     03/23/2010: [facility] -- T-Lipid Profile [id number] (signed)      Influenza Immunization History:    Influenza # 1:  Historical (07/03/2009)

## 2010-10-06 NOTE — Progress Notes (Signed)
Summary: NCADAP/pt assist med arrived for Apr  Phone Note Refill Request      Prescriptions: ATRIPLA 600-200-300 MG TABS (EFAVIRENZ-EMTRICITAB-TENOFOVIR) Take 1 tablet by mouth at bedtime  #30 x 0   Entered by:   Paulo Fruit  BS,CPht II,MPH   Authorized by:   Lina Sayre MD   Signed by:   Paulo Fruit  BS,CPht II,MPH on 12/11/2009   Method used:   Samples Given   RxID:   [id number]   Patient Assist Medication Verification: Medication: Atripla Lot# [id number] Exp Date:07 2013 Tech approval:MLD Prescription/Samples picked up by: patient Paulo Fruit  BS,CPht II,MPH  December 11, 2009 9:24 AM

## 2010-10-06 NOTE — Progress Notes (Signed)
Summary: NCADAP/pt assist med arrived for June via [facility] ADAP  Phone Note Refill Request      Prescriptions: ATRIPLA 600-200-300 MG TABS (EFAVIRENZ-EMTRICITAB-TENOFOVIR) Take 1 tablet by mouth at bedtime  #30 x 0   Entered by:   Paulo Fruit  BS,CPht II,MPH   Authorized by:   Lina Sayre MD   Signed by:   Paulo Fruit  BS,CPht II,MPH on 02/10/2010   Method used:   Samples Given   RxID:   [id number]  Patient Assist Medication Verification: Medication name: Atripla RX #  [id number] Tech approval:MLD Call placed to patient with message that assistance medications are ready for pick-up. Also spoke to patient , he wanted a refill on his Viagra via patient assistance program.  It was ordered and will arrive within 7-10 days from [employer] PAP. The confirmation number is [id number]. His ID number for future refills is [id number]. Paulo Fruit  BS,CPht II,MPH  February 10, 2010 3:35 PM

## 2010-10-06 NOTE — Progress Notes (Signed)
Summary: NCADAP/pt assist med arrived for Feb  Phone Note Refill Request      Prescriptions: ATRIPLA 600-200-300 MG TABS (EFAVIRENZ-EMTRICITAB-TENOFOVIR) Take 1 tablet by mouth at bedtime  #30 x 0   Entered by:   Paulo Fruit  BS,CPht II,MPH   Authorized by:   Lina Sayre MD   Signed by:   Paulo Fruit  BS,CPht II,MPH on 10/10/2009   Method used:   Samples Given   RxID:   [id number]   Patient Assist Medication Verification: Medication: Atripla JYN#[id number] Exp Date:07 2013 Tech approval:MLD Call placed to patient with message that assistance medications are ready for pick-up. Paulo Fruit  BS,CPht II,MPH  October 10, 2009 2:22 PM

## 2010-10-06 NOTE — Progress Notes (Signed)
Summary: pt. assistance for Viagra  Phone Note Call from Patient   Caller: Patient Summary of Call: Pt. picked up pt. assistance application for Viagra, will return it for Dr. Maurice March to sign and submit with RX Initial call taken by: Wendall Mola CMA Duncan Dull),  July 17, 2010 2:52 PM

## 2010-10-06 NOTE — Miscellaneous (Signed)
Summary: Financial Assessment  Clinical Lists Changes  Observations: Added new observation of PCTFPL: 93.48  (06/08/2010 8:51) Added new observation of HOUSEINCOME: 66440  (06/08/2010 8:51) Added new observation of HOUSING: Stable/permanent  (06/08/2010 8:51) Added new observation of FINASSESSDT: 06/08/2010  (06/08/2010 8:51) Added new observation of LATINO/HISP: No  (06/08/2010 8:51)

## 2010-10-06 NOTE — Progress Notes (Signed)
Summary: ncadap med arrived for Aug  Phone Note Refill Request      Prescriptions: ATRIPLA 600-200-300 MG TABS (EFAVIRENZ-EMTRICITAB-TENOFOVIR) Take 1 tablet by mouth at bedtime  #30 x 0   Entered by:   Paulo Fruit  BS,CPht II,MPH   Authorized by:   Lina Sayre MD   Signed by:   Paulo Fruit  BS,CPht II,MPH on 05/05/2010   Method used:   Samples Given   RxID:   [id number]  **Also the viagra was reorder today and will arrive to the office within 7-10 business day.  The confirmation number is [id number] for this order.**  Patient Assist Medication Verification: Medication name: Atripla RX # [id number] Tech approval:MLD Call placed to patient with message that assistance medications are ready for pick-up. Paulo Fruit  BS,CPht II,MPH  May 05, 2010 12:14 PM

## 2010-10-06 NOTE — Progress Notes (Signed)
Summary: ncadap med arrived for sept-Unable to reach pt-need correct #  Phone Note Refill Request      Prescriptions: ATRIPLA 600-200-300 MG TABS (EFAVIRENZ-EMTRICITAB-TENOFOVIR) Take 1 tablet by mouth at bedtime  #30 x 0   Entered by:   Paulo Fruit  BS,CPht II,MPH   Authorized by:   Lina Sayre MD   Signed by:   Paulo Fruit  BS,CPht II,MPH on 06/01/2010   Method used:   Samples Given   RxID:   [id number]  Patient Assist Medication Verification: Medication name: Neva Seat # [id number] Tech approval:mld tried to contact patient.  Unable to reach.  Please verify correct phone number for  future calls. at time call placed, msg stated incorrect phone number. Paulo Fruit  BS,CPht II,MPH  June 01, 2010 11:21 AM

## 2010-10-06 NOTE — Progress Notes (Signed)
Summary: NCADASP/pt assist med arrived for Jul  Phone Note Refill Request      Prescriptions: ATRIPLA 600-200-300 MG TABS (EFAVIRENZ-EMTRICITAB-TENOFOVIR) Take 1 tablet by mouth at bedtime  #30 x 0   Entered by:   Paulo Fruit  BS,CPht II,MPH   Authorized by:   Lina Sayre MD   Signed by:   Paulo Fruit  BS,CPht II,MPH on 04/03/2010   Method used:   Samples Given   RxID:   [id number]  Patient Assist Medication Verification: Medication name:Atripla RX #  [id number] Tech approval:MLD Call placed to patient with message that assistance medications are ready for pick-up. Paulo Fruit  BS,CPht II,MPH  April 03, 2010 3:00 PM

## 2010-10-06 NOTE — Progress Notes (Signed)
Summary: seen @ [facility] for swollen knee, tapped by MD, labs pending  Phone Note Call from Patient Call back at Home Phone [phone]   Caller: Patient Call For: Lina Sayre MD Reason for Call: Acute Illness Summary of Call: Message left by pt.  Seen @ [facility] for knee swelling.  UC MD drew off fluid from knee and sent for analysis.  Pt. wanting this office to call him re:  lab results.  RN called pt. back.  Message left for pt. to call the UC back to obtain information re: lab results.  Jennet Maduro RN  November 27, 2009 10:21 AM      Appended Document: seen @ [facility] for swollen knee, tapped by MD, labs pending Pt. called back wanting synovial fluid results.  RN called pt. back.  Fluid was negative for any growth of bacteria.  Pt. verbalized understanding.  He is feeling better.  Taking ibuprofen 800 mg for the discomfort.

## 2010-10-06 NOTE — Progress Notes (Signed)
Summary: Pt assist med arrived a 3 month supply  Phone Note Refill Request      Prescriptions: VIAGRA 100 MG TABS (SILDENAFIL CITRATE) Take as directed  #30 x 0   Entered by:   Paulo Fruit  BS,CPht II,MPH   Authorized by:   Lina Sayre MD   Signed by:   Paulo Fruit  BS,CPht II,MPH on 12/15/2009   Method used:   Samples Given   RxID:   [id number]   Patient Assist Medication Verification: Medication: Viagra 100mg  Lot# [id number] Exp Date:01 Jan 16 Tech approval:MLD Call placed to patient with message that assistance medications are ready for pick-up. Paulo Fruit  BS,CPht II,MPH  December 15, 2009 2:43 PM

## 2010-10-06 NOTE — Assessment & Plan Note (Signed)
Summary: f/u ov   Visit Type:  Follow-up  CC:  follow-up visit.  Preventive Screening-Counseling & Management  Alcohol-Tobacco     Alcohol drinks/day: 0     Smoking Status: quit     Year Quit: 2007     Pack years: 1 ppd     Passive Smoke Exposure: no  Caffeine-Diet-Exercise     Caffeine use/day: coffee,sodas     Does Patient Exercise: no  Safety-Violence-Falls     Seat Belt Use: yes   Current Allergies (reviewed today): No known allergies  Vital Signs:  Patient profile:   50 year old male Height:      69 inches (175.26 cm) Weight:      187.12 pounds (85.05 kg) BMI:     27.73 Temp:     98.3 degrees F (36.83 degrees C) oral Pulse rate:   75 / minute BP sitting:   134 / 78  (left arm)  Vitals Entered By: Baxter Hire) (March 12, 2010 10:22 AM) CC: follow-up visit Pain Assessment Patient in pain? no      Nutritional Status BMI of 25 - 29 = overweight Nutritional Status Detail appetite is good per patient  Have you ever been in a relationship where you felt threatened, hurt or afraid?No   Does patient need assistance? Functional Status Self care Ambulation Normal    Other Orders: T-Testosterone; Total [phone]) Est. Patient Level IV (46962) Future Orders: T-CBC w/Diff ([id number]) ... 07/13/2010 T-CD4SP ([facility]) (CD4SP) ... 07/13/2010 T-Comprehensive Metabolic Panel [id number]) ... 07/13/2010 T-HIV Viral Load [phone]) ... 07/13/2010  Process Orders Check Orders Results:     [facility]: ABN not required for this insurance Tests Sent for requisitioning (August 10, 2010 11:14 AM):     03/12/2010: [facility] -- T-Testosterone; Total [phone] (signed)     07/13/2010: [facility] -- T-CBC w/Diff [[id number]] (signed)     07/13/2010: [facility] -- T-Comprehensive Metabolic Panel [[id number]] (signed)     07/13/2010: [facility] -- T-HIV Viral Load  [phone] (signed)    Process Orders Check Orders Results:     [facility]: ABN not required for this insurance Tests Sent for requisitioning (August 10, 2010 11:14 AM):     03/12/2010: [facility] -- T-Testosterone; Total [phone] (signed)     07/13/2010: [facility] -- T-CBC w/Diff [[id number]] (signed)     07/13/2010: [facility] -- T-Comprehensive Metabolic Panel [[id number]] (signed)     07/13/2010: [facility] -- T-HIV Viral Load [phone] (signed)

## 2010-10-06 NOTE — Progress Notes (Signed)
Summary: PAP medication arrived 3 months supply nxt reoder 07/05/10  Phone Note Refill Request      Prescriptions: VIAGRA 100 MG TABS (SILDENAFIL CITRATE) Take as directed  #30 x 0   Entered by:   Paulo Fruit  BS,CPht II,MPH   Authorized by:   Lina Sayre MD   Signed by:   Paulo Fruit  BS,CPht II,MPH on 05/13/2010   Method used:   Samples Given   RxID:   [id number]   Patient Assist Medication Verification: Medication: viagra 100mg  Lot# [id number] Exp Date:29 Feb 16 Tech approval:MLD patient called to see if it was in.  He will be by to pick up today. Paulo Fruit  BS,CPht II,MPH  May 13, 2010 2:42 PM

## 2010-10-06 NOTE — Miscellaneous (Signed)
  Clinical Lists Changes  Observations: Added new observation of YEARAIDSPOS: 1989  (07/27/2010 11:34) Added new observation of HIV STATUS: CDC-defined AIDS  (07/27/2010 11:34)

## 2010-10-06 NOTE — Progress Notes (Signed)
Summary: NCADAP/pt assist med arrived for Mar  Phone Note Refill Request      Prescriptions: ATRIPLA 600-200-300 MG TABS (EFAVIRENZ-EMTRICITAB-TENOFOVIR) Take 1 tablet by mouth at bedtime  #30 x 0   Entered by:   Paulo Fruit  BS,CPht II,MPH   Authorized by:   Lina Sayre MD   Signed by:   Paulo Fruit  BS,CPht II,MPH on 11/06/2009   Method used:   Samples Given   RxID:   [id number]   Patient Assist Medication Verification: Medication: Atripla Lot# [id number] Exp Date:07 2013 Tech approval:MLD **patient came to pick up while at lab appt. Paulo Fruit  BS,CPht II,MPH  November 06, 2009 11:25 AM

## 2010-10-06 NOTE — Assessment & Plan Note (Signed)
Summary: flu shot[mkj]  Prior Medications: ATRIPLA 600-200-300 MG TABS (EFAVIRENZ-EMTRICITAB-TENOFOVIR) Take 1 tablet by mouth at bedtime VIAGRA 100 MG TABS (SILDENAFIL CITRATE) Take as directed BENAZEPRIL HCL 10 MG TABS (BENAZEPRIL HCL) Take 1 tablet by mouth once a day HYDROCHLOROTHIAZIDE 12.5 MG CAPS (HYDROCHLOROTHIAZIDE) Take 1 capsule by mouth once a day Current Allergies: No known allergies  Immunizations Administered:  Influenza Vaccine # 1:    Vaccine Type: Fluvax Non-MCR    Site: right deltoid    Mfr: Novartis    Dose: 0.5 ml    Route: IM    Given by: Wendall Mola CMA ( AAMA)    Exp. Date: 12/06/2010    Lot #: [id number]    VIS given: 03/31/10 version given July 17, 2010.  Flu Vaccine Consent Questions:    Do you have a history of severe allergic reactions to this vaccine? no    Any prior history of allergic reactions to egg and/or gelatin? no    Do you have a sensitivity to the preservative Thimersol? no    Do you have a past history of Guillan-Barre Syndrome? no    Do you currently have an acute febrile illness? no    Have you ever had a severe reaction to latex? no    Vaccine information given and explained to patient? yes  Orders Added: 1)  Influenza Vaccine NON MCR [00028]

## 2010-10-06 NOTE — Progress Notes (Signed)
Summary: Pt assist med arrived via [employer] [facility] 3 months supply  Phone Note Refill Request      Prescriptions: VIAGRA 100 MG TABS (SILDENAFIL CITRATE) Take as directed  #30 x 0   Entered by:   Paulo Fruit  BS,CPht II,MPH   Authorized by:   Lina Sayre MD   Signed by:   Paulo Fruit  BS,CPht II,MPH on 02/16/2010   Method used:   Samples Given   RxID:   [id number]   Patient Assist Medication Verification: Medication: Viagra 100mg  Lot# [id number] Exp Date:31 Jan 16 Tech approval:MLD Call placed to patient with message that assistance medications are ready for pick-up. Paulo Fruit  BS,CPht II,MPH  February 16, 2010 3:36 PM

## 2010-10-06 NOTE — Assessment & Plan Note (Signed)
Summary: F/U/OV/VS   CC:  follow-up visit and Back Pain.  History of Present Illness: Overall Isaac [name] is doing quite well with CD4 550 and HIV<20 copies/ml. His BP is in better range with 134/86 x2. and LFTs are just slightly elevated in 70"s with normal other LFTs. He has had about a 10 lb unintentional weight loss over the past year and he and his friends have nted loss of facial fullness and increasing muscle definiton. He has not noted any other body changes such as thinning ofhis extremities. Otherwise he has no complaints.  Preventive Screening-Counseling & Management  Alcohol-Tobacco     Alcohol drinks/day: 0     Smoking Status: quit     Year Quit: 2007     Pack years: 1 ppd     Passive Smoke Exposure: no  Caffeine-Diet-Exercise     Caffeine use/day: coffee and sodas     Does Patient Exercise: no  Safety-Violence-Falls     Seat Belt Use: yes   Current Allergies (reviewed today): No known allergies  Vital Signs:  Patient profile:   50 year old male Height:      69 inches (175.26 cm) Weight:      185.8 pounds (84.45 kg) BMI:     27.54 Temp:     98.0 degrees F (36.67 degrees C) oral Pulse rate:   85 / minute BP sitting:   134 / 86  (left arm)  Vitals Entered By: Baxter Hire) (July 02, 2010 9:52 AM) CC: follow-up visit, Back Pain Pain Assessment Patient in pain? no      Nutritional Status BMI of 25 - 29 = overweight Nutritional Status Detail appetite is good per patient  Have you ever been in a relationship where you felt threatened, hurt or afraid?No   Does patient need assistance? Functional Status Self care Ambulation Normal   Physical Exam  General:  alert and well-hydrated.   Eyes:  vision grossly intact.   Mouth:  good dentition and no gingival abnormalities.   Neck:  supple and full ROM. He has increasedd definition of his facial muscles bilaterally and more evident than I had noted before. Lungs:  normal respiratory effort and no  intercostal retractions.   Heart:  normal rate, regular rhythm, and no murmur.   Skin:  turgor normal and color normal.   Cervical Nodes:  No lymphadenopathy noted Axillary Nodes:  No palpable lymphadenopathy   Impression & Recommendations:  Problem # 1:  HIV DISEASE (ICD-042)  Orders: Est. Patient Level IV (99214)Future Orders: T-CD4SP ([facility]) (CD4SP) ... 10/01/2010 T-CBC w/Diff ([id number]) ... 10/01/2010 T-HIV Viral Load [id number]) ... 10/01/2010 T-Comprehensive Metabolic Panel [phone]) ... 10/01/2010 Hiv controlled wtih excellent immunereconstitution. 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 We will explore his elegibility with [employer]  manufacturer and seen if we can treat wtih HGH.  Problem # 2:  HYPERTENSION (ICD-401.9)  His updated medication list for this problem includes:    Benazepril Hcl 10 Mg Tabs (Benazepril hcl) .Marland Kitchen... Take 1 tablet by mouth once a day    Hydrochlorothiazide 12.5 Mg Caps (Hydrochlorothiazide) .Marland Kitchen... Take 1 capsule by mouth once a day BP better today with 134/86 and will continue towatchand if not better at goal of 120/80 will increase to benazapril 20mg    Patient Instructions: 1)  Please schedule a follow-up appointment in 3 months. 2)  Be sure to return for lab work one (1) week  before your next appointment as scheduled.

## 2010-10-08 NOTE — Progress Notes (Signed)
Summary: ADAP meds arrived, pt notified  Prescriptions: ATRIPLA 600-200-300 MG TABS (EFAVIRENZ-EMTRICITAB-TENOFOVIR) Take 1 tablet by mouth at bedtime  #30 x 0   Entered by:   Jimmy Footman, CMA   Authorized by:   Lina Sayre MD   Signed by:   Jimmy Footman, CMA on 08/24/2010   Method used:   Samples Given   RxID:   [id number]   Appended Document: ADAP meds arrived, pt notified pt. picked up ADAP meds

## 2010-10-08 NOTE — Letter (Signed)
Summary: [facility]  [address]   [place], [place] [address]   Phone: [phone]  Fax: [phone]        September 28, 2010  [facility] [address] [place], [place]  [address]  Dear Mr. Severino,  The Center has tried to contact you several times by phone regarding your medications that are ready to pick up.  The telephone number that we have in our system has a disconnected message.  Please call the Center to provide a current telephone number.  Also, please come to the Center to pick up your medication.  [facility] [employer] will be your place to order your refills and pick up your medication in the future.  Please call [phone] and follow the prompts to transfer your prescription.  The Center will no longer be accepting refills of ADAP medications.  Thank you for your follow-up of these requests.   Sincerely,    Jennet Maduro RN

## 2010-10-19 ENCOUNTER — Telehealth (INDEPENDENT_AMBULATORY_CARE_PROVIDER_SITE_OTHER): Payer: Self-pay | Admitting: Licensed Clinical Social Worker

## 2010-10-22 ENCOUNTER — Other Ambulatory Visit: Payer: Self-pay | Admitting: Infectious Diseases

## 2010-10-22 ENCOUNTER — Other Ambulatory Visit (INDEPENDENT_AMBULATORY_CARE_PROVIDER_SITE_OTHER): Payer: Self-pay

## 2010-10-22 ENCOUNTER — Encounter: Payer: Self-pay | Admitting: Infectious Diseases

## 2010-10-22 DIAGNOSIS — B20 Human immunodeficiency virus [HIV] disease: Secondary | ICD-10-CM

## 2010-10-22 LAB — CONVERTED CEMR LAB
ALT: 75 units/L — ABNORMAL HIGH (ref 0–53)
Alkaline Phosphatase: 55 units/L (ref 39–117)
Basophils Relative: 1 % (ref 0–1)
Creatinine, Ser: 1.06 mg/dL (ref 0.40–1.50)
Eosinophils Absolute: 0.1 10*3/uL (ref 0.0–0.7)
Eosinophils Relative: 2 % (ref 0–5)
HCT: 40.5 % (ref 39.0–52.0)
HIV 1 RNA Quant: <20 copies/mL (ref <20)
HIV-1 RNA Quant, Log: <1.3 (ref <1.30)
MCHC: 33.3 g/dL (ref 30.0–36.0)
MCV: 84.4 fL (ref 78.0–100.0)
Monocytes Relative: 8 % (ref 3–12)
Neutrophils Relative %: 41 % — ABNORMAL LOW (ref 43–77)
Platelets: 296 10*3/uL (ref 150–400)
Sodium: 139 meq/L (ref 135–145)
Total Bilirubin: 0.3 mg/dL (ref 0.3–1.2)
Total Protein: 7.5 g/dL (ref 6.0–8.3)

## 2010-10-23 LAB — T-HELPER CELL (CD4) - (RCID CLINIC ONLY)
CD4 % Helper T Cell: 24 % — ABNORMAL LOW (ref 33–55)
CD4 T Cell Abs: 550 uL (ref 400–2700)

## 2010-10-28 NOTE — Progress Notes (Signed)
  Phone Note Outgoing Call   Call placed by: Starleen Arms CMA,  October 19, 2010 11:19 AM Call placed to: Patient Summary of Call: Patient assistance medication arrived today, patient is aware. Viagra 100mg  Initial call taken by: Starleen Arms CMA,  October 19, 2010 11:20 AM

## 2010-11-05 ENCOUNTER — Ambulatory Visit (INDEPENDENT_AMBULATORY_CARE_PROVIDER_SITE_OTHER): Payer: Self-pay | Admitting: Infectious Diseases

## 2010-11-05 ENCOUNTER — Encounter: Payer: Self-pay | Admitting: Infectious Diseases

## 2010-11-05 DIAGNOSIS — B1710 Acute hepatitis C without hepatic coma: Secondary | ICD-10-CM

## 2010-11-05 DIAGNOSIS — I10 Essential (primary) hypertension: Secondary | ICD-10-CM

## 2010-11-05 DIAGNOSIS — B20 Human immunodeficiency virus [HIV] disease: Secondary | ICD-10-CM

## 2010-11-12 NOTE — Assessment & Plan Note (Signed)
Summary: F/U   Vital Signs:  Patient profile:   50 year old male Height:      69 inches (175.26 cm) Weight:      185.75 pounds (84.43 kg) BMI:     27.53 Temp:     98.4 degrees F (36.89 degrees C) oral Pulse rate:   99 / minute BP sitting:   125 / 80  (left arm)  Vitals Entered By: Starleen Arms CMA (November 05, 2010 10:16 AM) CC: f/u Is Patient Diabetic? No Pain Assessment Patient in pain? no      Nutritional Status BMI of 25 - 29 = overweight  Does patient need assistance? Functional Status Self care Ambulation Normal   Primary Provider:  Lina Sayre MD  CC:  f/u.  History of Present Illness: Isaac [name] is here for routine f/u of his HIV, HCV and hypertension. He is adherent to all his meds and it shows with HIV<20 copies/ml and CD4 550. BP  is under good control. Discussed eventual treatment of his HCV that will require trial validation of the new oral antiHCV agents and some form of national health ininsurance. Isaac [name] is knowledgeable about these coming and necessary changes. He regularly uses condoms.      He is in school and studying.  Allergies (verified): No Known Drug Allergies  Physical Exam  General:  Well-developed,well-nourished,in no acute distress; alert,appropriate and cooperative throughout examination Mouth:  Oral mucosa and oropharynx without lesions or exudates.  Teeth in good repair. Neck:  supple, full ROM, and no masses.   Lungs:  normal respiratory effort.   Heart:  normal rate, no murmur, and no gallop.     Impression & Recommendations:  Problem # 1:  HIV DISEASE (ICD-042)  Orders: Est. Patient Level IV (99214)Future Orders: T-CBC w/Diff ([id number]) ... 04/07/2011 T-CD4SP ([facility]) (CD4SP) ... 04/07/2011 T-Comprehensive Metabolic Panel [id number]) ... 04/07/2011 T-HIV Viral Load [id number]) ... 04/07/2011 Continue HIV and other regimens.  Patient Instructions: 1)  Please schedule a follow-up appointment in 5 months. 2)  Be sure  to return for lab work one (1) week before your next appointment as scheduled.   Orders Added: 1)  T-CBC w/Diff [[id number]] 2)  T-CD4SP [facility]) [CD4SP] 3)  T-Comprehensive Metabolic Panel [[id number]] 4)  T-HIV Viral Load [phone] 5)  Est. Patient Level IV [62694]

## 2010-11-22 LAB — T-HELPER CELL (CD4) - (RCID CLINIC ONLY)
CD4 % Helper T Cell: 23 % — ABNORMAL LOW (ref 33–55)
CD4 T Cell Abs: 390 uL — ABNORMAL LOW (ref 400–2700)

## 2010-11-25 LAB — CBC
Hemoglobin: 14.4 g/dL (ref 13.0–17.0)
MCHC: 33.8 g/dL (ref 30.0–36.0)
MCV: 86.7 fL (ref 78.0–100.0)
RBC: 4.91 MIL/uL (ref 4.22–5.81)

## 2010-11-25 LAB — POCT I-STAT, CHEM 8
Calcium, Ion: 1.07 mmol/L — ABNORMAL LOW (ref 1.12–1.32)
Chloride: 101 mEq/L (ref 96–112)
Creatinine, Ser: 1.2 mg/dL (ref 0.4–1.5)
Glucose, Bld: 106 mg/dL — ABNORMAL HIGH (ref 70–99)
HCT: 47 % (ref 39.0–52.0)

## 2010-11-25 LAB — DIFFERENTIAL
Basophils Relative: 0 % (ref 0–1)
Eosinophils Absolute: 0 10*3/uL (ref 0.0–0.7)
Monocytes Absolute: 0.7 10*3/uL (ref 0.1–1.0)
Monocytes Relative: 13 % — ABNORMAL HIGH (ref 3–12)
Neutro Abs: 3.5 10*3/uL (ref 1.7–7.7)

## 2010-11-29 LAB — SYNOVIAL CELL COUNT + DIFF, W/ CRYSTALS
Lymphocytes-Synovial Fld: 2 % (ref 0–20)
Neutrophil, Synovial: 73 % — ABNORMAL HIGH (ref 0–25)
WBC, Synovial: UNDETERMINED /mm3 (ref 0–200)

## 2010-11-29 LAB — BODY FLUID CULTURE

## 2010-12-10 LAB — T-HELPER CELL (CD4) - (RCID CLINIC ONLY)
CD4 % Helper T Cell: 23 % — ABNORMAL LOW (ref 33–55)
CD4 T Cell Abs: 440 uL (ref 400–2700)

## 2010-12-17 LAB — POCT RAPID STREP A (OFFICE): Streptococcus, Group A Screen (Direct): POSITIVE — AB

## 2010-12-22 LAB — T-HELPER CELL (CD4) - (RCID CLINIC ONLY)
CD4 % Helper T Cell: 22 % — ABNORMAL LOW (ref 33–55)
CD4 T Cell Abs: 360 uL — ABNORMAL LOW (ref 400–2700)

## 2011-01-05 ENCOUNTER — Other Ambulatory Visit: Payer: Self-pay | Admitting: *Deleted

## 2011-01-05 ENCOUNTER — Telehealth: Payer: Self-pay | Admitting: *Deleted

## 2011-01-05 DIAGNOSIS — N529 Male erectile dysfunction, unspecified: Secondary | ICD-10-CM

## 2011-01-05 MED ORDER — SILDENAFIL CITRATE 100 MG PO TABS: 100.0000 mg | ORAL_TABLET | Freq: Every day | ORAL | Status: DC | PRN

## 2011-01-05 NOTE — Telephone Encounter (Signed)
Patient notified that Pt. Assistance medication Viagra has arrived and is ready for pick up. Isaac [name] CMA

## 2011-01-07 ENCOUNTER — Ambulatory Visit: Payer: Self-pay

## 2011-03-15 ENCOUNTER — Telehealth: Payer: Self-pay | Admitting: Infectious Diseases

## 2011-03-15 NOTE — Telephone Encounter (Signed)
Ordered Viagra from [employer] [facility].  Order # is [id number] and should arrive within 7 - 10 days...ke

## 2011-03-23 ENCOUNTER — Other Ambulatory Visit: Payer: Self-pay | Admitting: *Deleted

## 2011-03-23 DIAGNOSIS — N529 Male erectile dysfunction, unspecified: Secondary | ICD-10-CM

## 2011-03-23 MED ORDER — SILDENAFIL CITRATE 100 MG PO TABS: 100.0000 mg | ORAL_TABLET | Freq: Every day | ORAL | Status: DC | PRN

## 2011-03-25 ENCOUNTER — Other Ambulatory Visit (INDEPENDENT_AMBULATORY_CARE_PROVIDER_SITE_OTHER): Payer: Self-pay

## 2011-03-25 DIAGNOSIS — B20 Human immunodeficiency virus [HIV] disease: Secondary | ICD-10-CM

## 2011-03-25 LAB — CBC WITH DIFFERENTIAL/PLATELET
Basophils Absolute: 0 10*3/uL (ref 0.0–0.1)
Basophils Relative: 0 % (ref 0–1)
Eosinophils Absolute: 0.1 10*3/uL (ref 0.0–0.7)
MCHC: 33.1 g/dL (ref 30.0–36.0)
Neutro Abs: 2.6 10*3/uL (ref 1.7–7.7)
Neutrophils Relative %: 42 % — ABNORMAL LOW (ref 43–77)
Platelets: 322 10*3/uL (ref 150–400)
RDW: 13 % (ref 11.5–15.5)

## 2011-03-26 LAB — T-HELPER CELL (CD4) - (RCID CLINIC ONLY)
CD4 % Helper T Cell: 25 % — ABNORMAL LOW (ref 33–55)
CD4 T Cell Abs: 720 uL (ref 400–2700)

## 2011-03-26 LAB — COMPLETE METABOLIC PANEL WITH GFR
AST: 80 U/L — ABNORMAL HIGH (ref 0–37)
Albumin: 3.9 g/dL (ref 3.5–5.2)
Alkaline Phosphatase: 66 U/L (ref 39–117)
Potassium: 4 mEq/L (ref 3.5–5.3)
Sodium: 141 mEq/L (ref 135–145)
Total Bilirubin: 0.3 mg/dL (ref 0.3–1.2)
Total Protein: 7.2 g/dL (ref 6.0–8.3)

## 2011-03-26 LAB — HIV-1 RNA QUANT-NO REFLEX-BLD: HIV 1 RNA Quant: <20 copies/mL (ref <20)

## 2011-04-08 ENCOUNTER — Encounter: Payer: Self-pay | Admitting: Infectious Diseases

## 2011-04-08 ENCOUNTER — Ambulatory Visit (INDEPENDENT_AMBULATORY_CARE_PROVIDER_SITE_OTHER): Payer: Self-pay | Admitting: Infectious Diseases

## 2011-04-08 DIAGNOSIS — Z113 Encounter for screening for infections with a predominantly sexual mode of transmission: Secondary | ICD-10-CM

## 2011-04-08 DIAGNOSIS — I10 Essential (primary) hypertension: Secondary | ICD-10-CM

## 2011-04-08 DIAGNOSIS — Z79899 Other long term (current) drug therapy: Secondary | ICD-10-CM

## 2011-04-08 DIAGNOSIS — B20 Human immunodeficiency virus [HIV] disease: Secondary | ICD-10-CM

## 2011-04-08 MED ORDER — BENAZEPRIL HCL 10 MG PO TABS: 10.0000 mg | ORAL_TABLET | Freq: Every day | ORAL | Status: DC

## 2011-04-08 MED ORDER — HYDROCHLOROTHIAZIDE 12.5 MG PO CAPS: 12.5000 mg | ORAL_CAPSULE | Freq: Every day | ORAL | Status: DC

## 2011-04-08 NOTE — Progress Notes (Signed)
Isaac [name] broke up with his girlfriend of several years and has been drinking daily and depressed. He wants referral for supportive therapy and referred [facility] with Isaac [name]. [name] has been adherent enough to his ARVs to be Hiv suppressed with CD4 count of 700. He and I aregrateful for that. He has had no fevers or GI" symptoms. He believes that this is cause. Exam shows not adenopathy and abd is nontenter. Will see in 4 months

## 2011-04-12 ENCOUNTER — Other Ambulatory Visit: Payer: Self-pay | Admitting: *Deleted

## 2011-04-12 DIAGNOSIS — B20 Human immunodeficiency virus [HIV] disease: Secondary | ICD-10-CM

## 2011-04-12 MED ORDER — EFAVIRENZ-EMTRICITAB-TENOFOVIR 600-200-300 MG PO TABS: 1.0000 | ORAL_TABLET | Freq: Every day | ORAL | Status: DC

## 2011-04-30 ENCOUNTER — Encounter: Payer: Self-pay | Admitting: Adult Health

## 2011-04-30 ENCOUNTER — Ambulatory Visit (INDEPENDENT_AMBULATORY_CARE_PROVIDER_SITE_OTHER): Payer: Self-pay | Admitting: Adult Health

## 2011-04-30 ENCOUNTER — Ambulatory Visit: Payer: Self-pay | Admitting: Adult Health

## 2011-04-30 ENCOUNTER — Telehealth: Payer: Self-pay | Admitting: *Deleted

## 2011-04-30 DIAGNOSIS — R300 Dysuria: Secondary | ICD-10-CM

## 2011-04-30 DIAGNOSIS — J329 Chronic sinusitis, unspecified: Secondary | ICD-10-CM

## 2011-04-30 DIAGNOSIS — Z113 Encounter for screening for infections with a predominantly sexual mode of transmission: Secondary | ICD-10-CM

## 2011-04-30 DIAGNOSIS — R634 Abnormal weight loss: Secondary | ICD-10-CM

## 2011-04-30 DIAGNOSIS — B20 Human immunodeficiency virus [HIV] disease: Secondary | ICD-10-CM

## 2011-04-30 LAB — POCT URINALYSIS DIPSTICK
Bilirubin, UA: NEGATIVE
Blood, UA: NEGATIVE
Glucose, UA: NEGATIVE
Ketones, UA: NEGATIVE
Nitrite, UA: NEGATIVE
pH, UA: 6

## 2011-04-30 LAB — URINALYSIS, ROUTINE W REFLEX MICROSCOPIC
Bilirubin Urine: NEGATIVE
Glucose, UA: NEGATIVE mg/dL
Hgb urine dipstick: NEGATIVE
Ketones, ur: NEGATIVE mg/dL
Leukocytes, UA: NEGATIVE
Protein, ur: NEGATIVE mg/dL
pH: 6 (ref 5.0–8.0)

## 2011-04-30 MED ORDER — CIPROFLOXACIN HCL 500 MG PO TABS: 500.0000 mg | ORAL_TABLET | Freq: Two times a day (BID) | ORAL | Status: AC

## 2011-04-30 MED ORDER — CETIRIZINE HCL 10 MG PO TABS: 10.0000 mg | ORAL_TABLET | Freq: Every day | ORAL | Status: DC

## 2011-04-30 MED ORDER — DIPHENHYDRAMINE HCL 25 MG PO TABS: ORAL_TABLET | ORAL | Status: DC

## 2011-04-30 NOTE — Telephone Encounter (Signed)
Persistent headaches for 2 weeks.  Night sweats.  Requesting appt.  Jennet Maduro, RN

## 2011-04-30 NOTE — Progress Notes (Signed)
Subjective:    Patient ID: Isaac [name], male    DOB: 24-Oct-1960, 50 y.o.   MRN: [id number]  HPI Presents to clinic with the following complaints: Frontal sinus headache for the past 2 weeks with intermittent night sweats as well. Weight loss of > 20 pounds for the past 60 days. States his normal weight is 185 pounds and he currently weighs 160. His weight loss as per report is unintentional and has had no change in diet or appetite. He is also complaining of dysuria, the past 2 weeks without, penile discharge. Denies any change in urine character, color, or odor. Denies increased urinary frequency, decreased urinary output .or Flank pain. Denies any known fevers. States he's had episodes where he has been feeling very hot at night time and has had a fan blowing him as well. Denies tinnitus vestibular problems, weakness, neck stiffness, or visual disturbances.   Review of Systems  Constitutional: Positive for diaphoresis, fatigue and unexpected weight change. Negative for fever, chills, activity change and appetite change.  HENT: Positive for congestion and sinus pressure. Negative for hearing loss, ear pain, nosebleeds, sore throat, facial swelling, rhinorrhea, sneezing, drooling, mouth sores, trouble swallowing, neck pain, neck stiffness, dental problem, voice change, postnasal drip, tinnitus and ear discharge.   Eyes: Negative for photophobia, pain, discharge, redness, itching and visual disturbance.  Respiratory: Negative for cough and shortness of breath.   Cardiovascular: Negative.   Gastrointestinal: Negative.   Genitourinary: Positive for dysuria. Negative for urgency, frequency, hematuria, flank pain, decreased urine volume, discharge, penile swelling, scrotal swelling, enuresis, difficulty urinating, genital sores, penile pain and testicular pain.  Musculoskeletal: Negative for myalgias, back pain, joint swelling, arthralgias and gait problem.  Skin: Negative for color change, pallor,  rash and wound.  Neurological: Negative for dizziness, tremors, seizures, syncope, facial asymmetry, speech difficulty, weakness, light-headedness, numbness and headaches.  Hematological: Negative for adenopathy. Does not bruise/bleed easily.  Psychiatric/Behavioral: Negative for suicidal ideas, hallucinations, behavioral problems, confusion, sleep disturbance, self-injury, dysphoric mood, decreased concentration and agitation. The patient is not nervous/anxious and is not hyperactive.        Objective:   Physical Exam  Constitutional: He is oriented to person, place, and time. He appears well-developed and well-nourished. No distress.  HENT:  Head: Normocephalic and atraumatic.  Nose: Nose normal.  Mouth/Throat: Oropharynx is clear and moist. No oropharyngeal exudate.       TM structures are obscured bilaterally with bulging noted in both TMs. No exudates observed, but mobility is limited. Dullness was percussed over frontal sinus area and there were was noted significant tenderness upon pressure to the maxillary sinus region bilaterally. No ophthalmoplegia noted.  Eyes: Conjunctivae and EOM are normal. Pupils are equal, round, and reactive to light. Right eye exhibits no discharge. Left eye exhibits no discharge.  Neck: Normal range of motion. Neck supple.  Cardiovascular: Normal rate and regular rhythm.   Pulmonary/Chest: Effort normal and breath sounds normal.  Abdominal: Soft. Bowel sounds are normal.  Musculoskeletal: Normal range of motion.  Lymphadenopathy:    He has no cervical adenopathy.  Neurological: He is alert and oriented to person, place, and time. No cranial nerve deficit. He exhibits normal muscle tone. Coordination normal.  Skin: Skin is warm and dry. No rash noted. No erythema. No pallor.  Psychiatric: His behavior is normal. Judgment and thought content normal.       Affect is blunted. Mood is appropriate          Assessment & Plan:  1. Sinusitis.  Symptomatically and objectively, findings are consistent with maxillary and frontal sinusitis. Given his subjective history of night sweats,, we will treat with Cipro 500 mg by mouth twice a day x10 days. Recommend cetirizine 10 mg by mouth daily, and diphenhydramine 25-50 mg by mouth each bedtime. Symptoms do not improve within the next 7-10 days. He should contact the clinic for reevaluation.  2. Dysuria. Urine dipstick was completely normal, but a normal urinalysis was obtained, as well as a urine for GC and Chlamydia, and we also obtain an RPR. Those results will be pending. At present, we recommend increasing his fluid intake, drinking cranberry juice, and awaiting results of the remaining labs.  3. Weight Loss. The review of his records shows weight loss, actually has been over a longer period of time than 60 days, and according to notes, he has a reported history of depression, as well as heavy alcohol use in the past. Although he admits to no alcohol use presently, depression, still may be a contributing factor in his weight loss. Would recommend at this time that we try and Ensure one can 3 times a day between meals and see if this helps. Nevertheless, we will also obtain a CBC, and a CMP to review for any abnormalities.  Would recommend followup as originally planned with Dr. Maurice March and/or when necessary regarding any of the above complaints. He verbally acknowledged all information was provided to him and agreed with plan of care.  **Patient states he will call back on Monday to review the remainder of his tests obtained today. He may review his results with the nurse with whom he is speaking when he calls**

## 2011-04-30 NOTE — Patient Instructions (Signed)
Sinusitis Sinuses are air pockets within the bones of your face. The growth of bacteria within a sinus leads to infection. Infection keeps the sinuses from draining. This infection is called sinusitis. SYMPTOMS There will be different areas of pain depending on which sinuses have become infected.  The maxillary sinuses often produce pain beneath the eyes.   Frontal sinusitis may cause pain in the middle of the forehead and above the eyes.  Other problems (symptoms) include:  Toothaches.   Colored, pus-like (purulent) drainage from the nose.   Any swelling, warmth, or tenderness over the sinus areas may be signs of infection.  TREATMENT Sinusitis is most often determined by an exam and you may have x-rays taken. If x-rays have been taken, make sure you obtain your results. Or find out how you are to obtain them. Your caregiver may give you medications (antibiotics). These are medications that will help kill the infection. You may also be given a medication (decongestant) that helps to reduce sinus swelling.   HOME CARE INSTRUCTIONS  Only take over-the-counter or prescription medicines for pain, discomfort, or fever as directed by your caregiver.   Drink extra fluids. Fluids help thin the mucus so your sinuses can drain more easily.   Applying either moist heat or ice packs to the sinus areas may help relieve discomfort.   Use saline nasal sprays to help moisten your sinuses. The sprays can be found at your local drugstore.  SEEK IMMEDIATE MEDICAL CARE IF YOU DEVELOP:  High fever that is still present after two days of antibiotic treatment.   Increasing pain, severe headaches, or toothache.   Nausea, vomiting, or drowsiness.   Unusual swelling around the face or trouble seeing.  MAKE SURE YOU:    Understand these instructions.   Will watch your condition.   Will get help right away if you are not doing well or get worse.  Document Released: 08/23/2005 Document Re-Released:  08/05/2008 [facility] Patient Information 2011 [place], [place]. Dysuria (Pain with Urination) Dysuria is the medical term for pain with urination. There are many causes for dysuria, but urinary tract infection is the most common. If a urinalysis was performed it can show that there is a urinary tract infection. A urine culture confirms that you or your child are sick. You will need to follow-up with a healthcare provider because:  If a urine culture was done you will need to know the culture results and treatment recommendations.   If the urine culture was positive, you or your child will need to be put on antibiotics or know if the antibiotics prescribed are the right antibiotics for your urinary tract infection.   If the urine culture is negative (no urinary tract infection), then other causes may need to be explored or antibiotics need to be stopped.   Today laboratory work may have been done and there does not seem to be a an infection. If cultures were done they will take at least 24 to 48 hours to be completed.   Today x-rays may have been taken and they read as normal. No cause can be found for the problems. The x-rays may be re-read by a radiologist and you will be contacted if additional findings are made.   You or your child may have been put on medications to help with this problem until you can see your primary caregiver. If the problems get better, see your primary caregiver if the problems return. If you were given antibiotics (medications which kill germs),  take all of the mediations as directed for the full course of treatment.  If laboratory work was done, you need to find the results. Leave a telephone number where you can be reached. If this is not possible, make sure you find out how you are to get test results. HOME CARE INSTRUCTIONS  Drink lots of fluids. For adults, drink eight, 8 ounce glasses of clear juice or water a day. For children, replace fluids as suggested by your  caregiver.   Empty the bladder often. Avoid holding urine for long periods of time.   After a bowel movement, women should cleanse front to back, using each tissue only once.   Empty your bladder before and after sexual intercourse.   Take all the medicine given to you until it is gone. You may feel better in a few days, but TAKE ALL MEDICINE.   Avoid caffeine, tea, alcohol and carbonated beverages, because they tend to irritate the bladder.   In men, alcohol may irritate the prostate.   Only take over-the-counter or prescription medicines for pain, discomfort, or fever as directed by your caregiver.   If your caregiver has given you a follow-up appointment, it is very important to keep that appointment. Not keeping the appointment could result in a chronic or permanent injury, pain, and disability. If there is any problem keeping the appointment, you must call back to this facility for assistance.  SEEK IMMEDIATE MEDICAL CARE IF:  Back pain develops.   A fever develops.   There is nausea (feeling sick to your stomach) or vomiting (throwing up).   Problems are no better with medications or are getting worse.  MAKE SURE YOU:    Understand these instructions.   Will watch your condition.   Will get help right away if you are not doing well or get worse.  Document Released: 05/21/2004 Document Re-Released: 02/10/2010 [facility] Patient Information 2011 [place], [place].

## 2011-05-01 LAB — COMPLETE METABOLIC PANEL WITH GFR
ALT: 75 U/L — ABNORMAL HIGH (ref 0–53)
AST: 82 U/L — ABNORMAL HIGH (ref 0–37)
Albumin: 4.1 g/dL (ref 3.5–5.2)
Alkaline Phosphatase: 71 U/L (ref 39–117)
BUN: 12 mg/dL (ref 6–23)
Calcium: 9.3 mg/dL (ref 8.4–10.5)
Chloride: 101 mEq/L (ref 96–112)
Potassium: 4.3 mEq/L (ref 3.5–5.3)
Sodium: 136 mEq/L (ref 135–145)
Total Protein: 7.7 g/dL (ref 6.0–8.3)

## 2011-05-01 LAB — CBC WITH DIFFERENTIAL/PLATELET
Basophils Absolute: 0.2 10*3/uL — ABNORMAL HIGH (ref 0.0–0.1)
Basophils Relative: 3 % — ABNORMAL HIGH (ref 0–1)
HCT: 42.1 % (ref 39.0–52.0)
Hemoglobin: 13.4 g/dL (ref 13.0–17.0)
Lymphocytes Relative: 66 % — ABNORMAL HIGH (ref 12–46)
MCHC: 31.8 g/dL (ref 30.0–36.0)
Monocytes Absolute: 0.9 10*3/uL (ref 0.1–1.0)
Neutro Abs: 1.2 10*3/uL — ABNORMAL LOW (ref 1.7–7.7)
Neutrophils Relative %: 17 % — ABNORMAL LOW (ref 43–77)
RDW: 12.6 % (ref 11.5–15.5)
WBC: 7 10*3/uL (ref 4.0–10.5)

## 2011-05-01 LAB — GC/CHLAMYDIA PROBE AMP, URINE
Chlamydia, Swab/Urine, PCR: NEGATIVE
GC Probe Amp, Urine: NEGATIVE

## 2011-05-03 ENCOUNTER — Telehealth: Payer: Self-pay

## 2011-05-03 NOTE — Telephone Encounter (Signed)
Calling for lab results.   STD Screening results given.  Please advise regarding blood test.  Tomasita Morrow

## 2011-05-27 LAB — POCT RAPID STREP A: Streptococcus, Group A Screen (Direct): NEGATIVE

## 2011-06-02 LAB — T-HELPER CELL (CD4) - (RCID CLINIC ONLY): CD4 % Helper T Cell: 18 — ABNORMAL LOW

## 2011-06-10 ENCOUNTER — Other Ambulatory Visit: Payer: Self-pay | Admitting: *Deleted

## 2011-06-10 NOTE — Telephone Encounter (Signed)
P[t walked in asking for a bottle of viagra. I called [facility] & they will ship it today . his  Application expires 08/11/11. His ID is [id number].  He will call us in 7-10 days to see if it has arrived. He does not have a phone at this time. He refused a flu shot today citing a head cold

## 2011-06-15 ENCOUNTER — Telehealth: Payer: Self-pay | Admitting: *Deleted

## 2011-06-15 NOTE — Telephone Encounter (Signed)
Checked on viagra refill.  It is in process

## 2011-06-17 LAB — T-HELPER CELL (CD4) - (RCID CLINIC ONLY)
CD4 % Helper T Cell: 17 — ABNORMAL LOW
CD4 T Cell Abs: 320 — ABNORMAL LOW

## 2011-06-18 ENCOUNTER — Telehealth: Payer: Self-pay | Admitting: *Deleted

## 2011-06-18 NOTE — Telephone Encounter (Signed)
The order for viagra from [employer] is still "in process"  When it comes in, ask pt to come in 3rd week of November to re-apply for next year. It expires 08/11/11

## 2011-06-22 ENCOUNTER — Other Ambulatory Visit: Payer: Self-pay | Admitting: *Deleted

## 2011-06-22 NOTE — Telephone Encounter (Signed)
States he got a letter in the mail today stating he has been approved for his viagra. It has not arrived here yet. Told him I will call him when it does

## 2011-06-23 ENCOUNTER — Other Ambulatory Visit: Payer: Self-pay | Admitting: *Deleted

## 2011-06-23 DIAGNOSIS — N529 Male erectile dysfunction, unspecified: Secondary | ICD-10-CM

## 2011-06-23 LAB — T-HELPER CELL (CD4) - (RCID CLINIC ONLY)
CD4 % Helper T Cell: 16 — ABNORMAL LOW
CD4 T Cell Abs: 320 — ABNORMAL LOW

## 2011-06-23 MED ORDER — SILDENAFIL CITRATE 100 MG PO TABS: 100.0000 mg | ORAL_TABLET | Freq: Every day | ORAL | Status: DC | PRN

## 2011-06-23 NOTE — Telephone Encounter (Signed)
Unable to reach pt by phone. Number is incorrect. Will give this refill to him when he calls or comes in. Will need to make an appt to do the yearly app for this med in mid to late november

## 2011-07-07 IMAGING — CR DG CHEST 2V
2 series · 2 of 2 positions shown · non-contrast
Comparison: 10/09/2008

CLINICAL DATA: Fever.

CHEST - 2 VIEW

[view not recorded (1 of 2)]
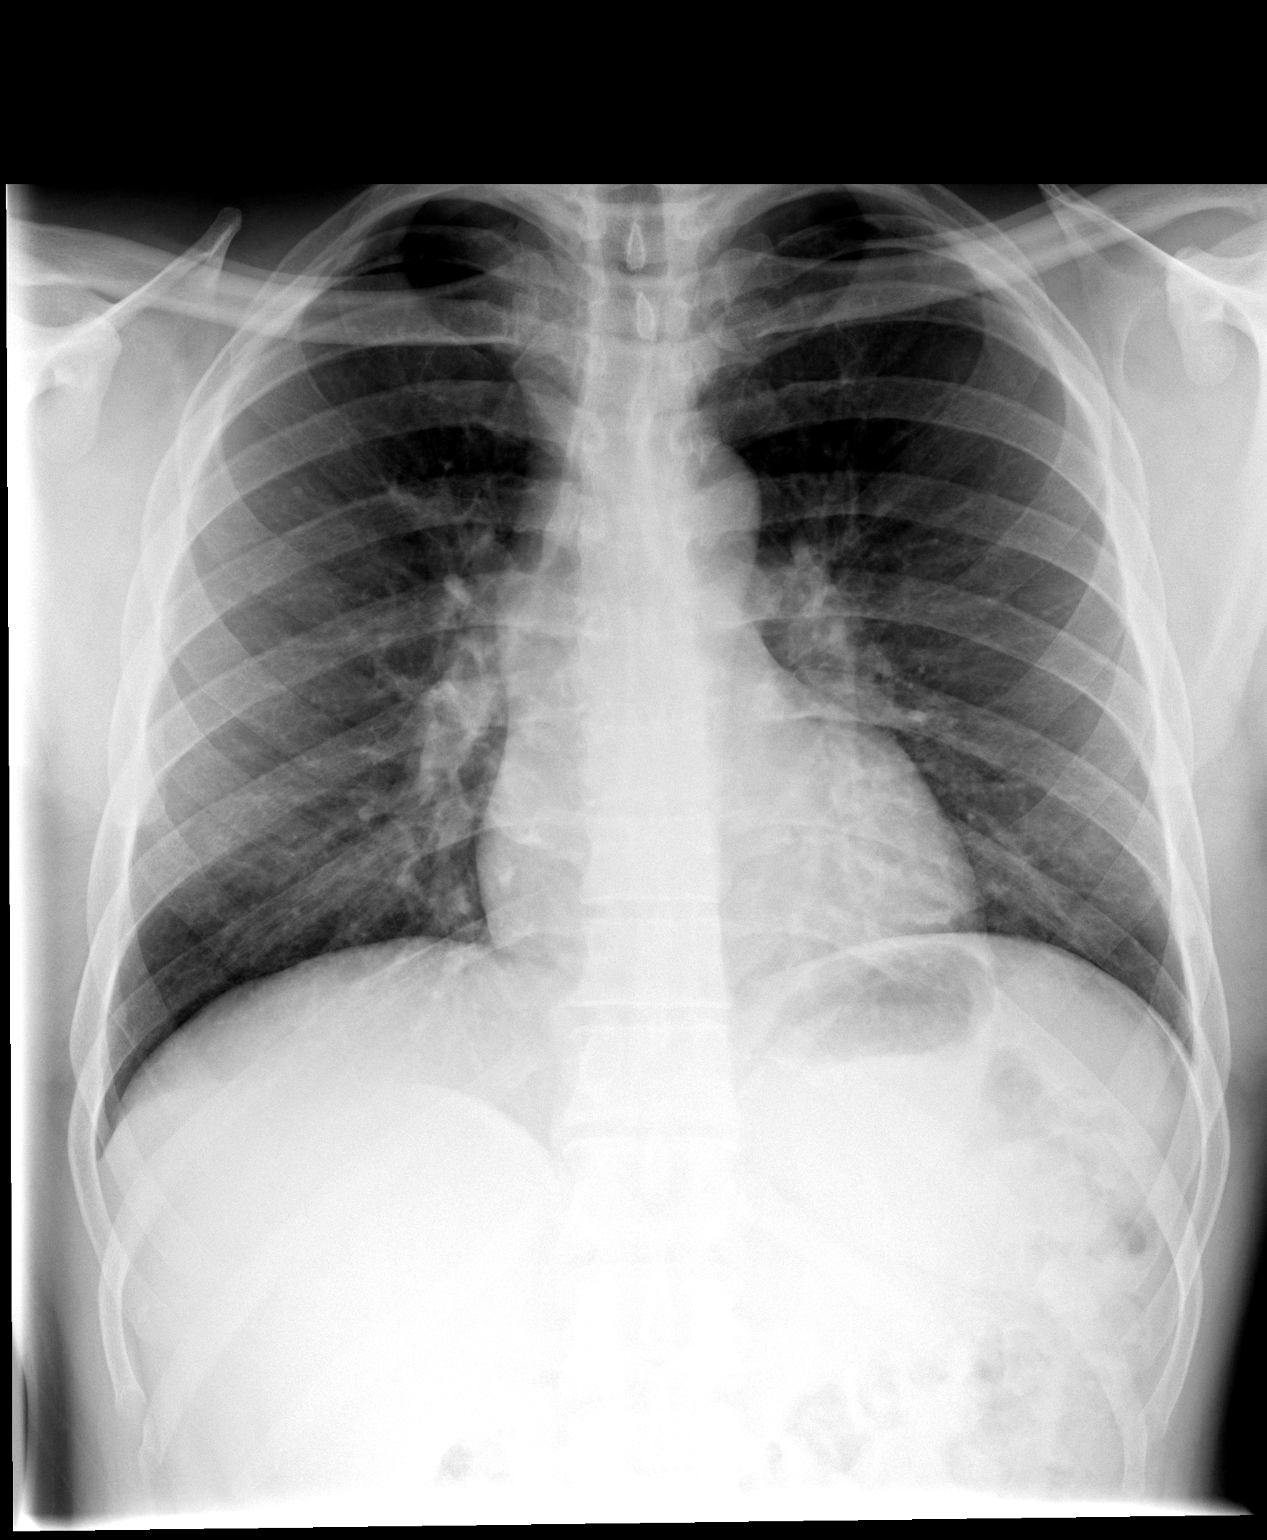

[view not recorded (2 of 2)]
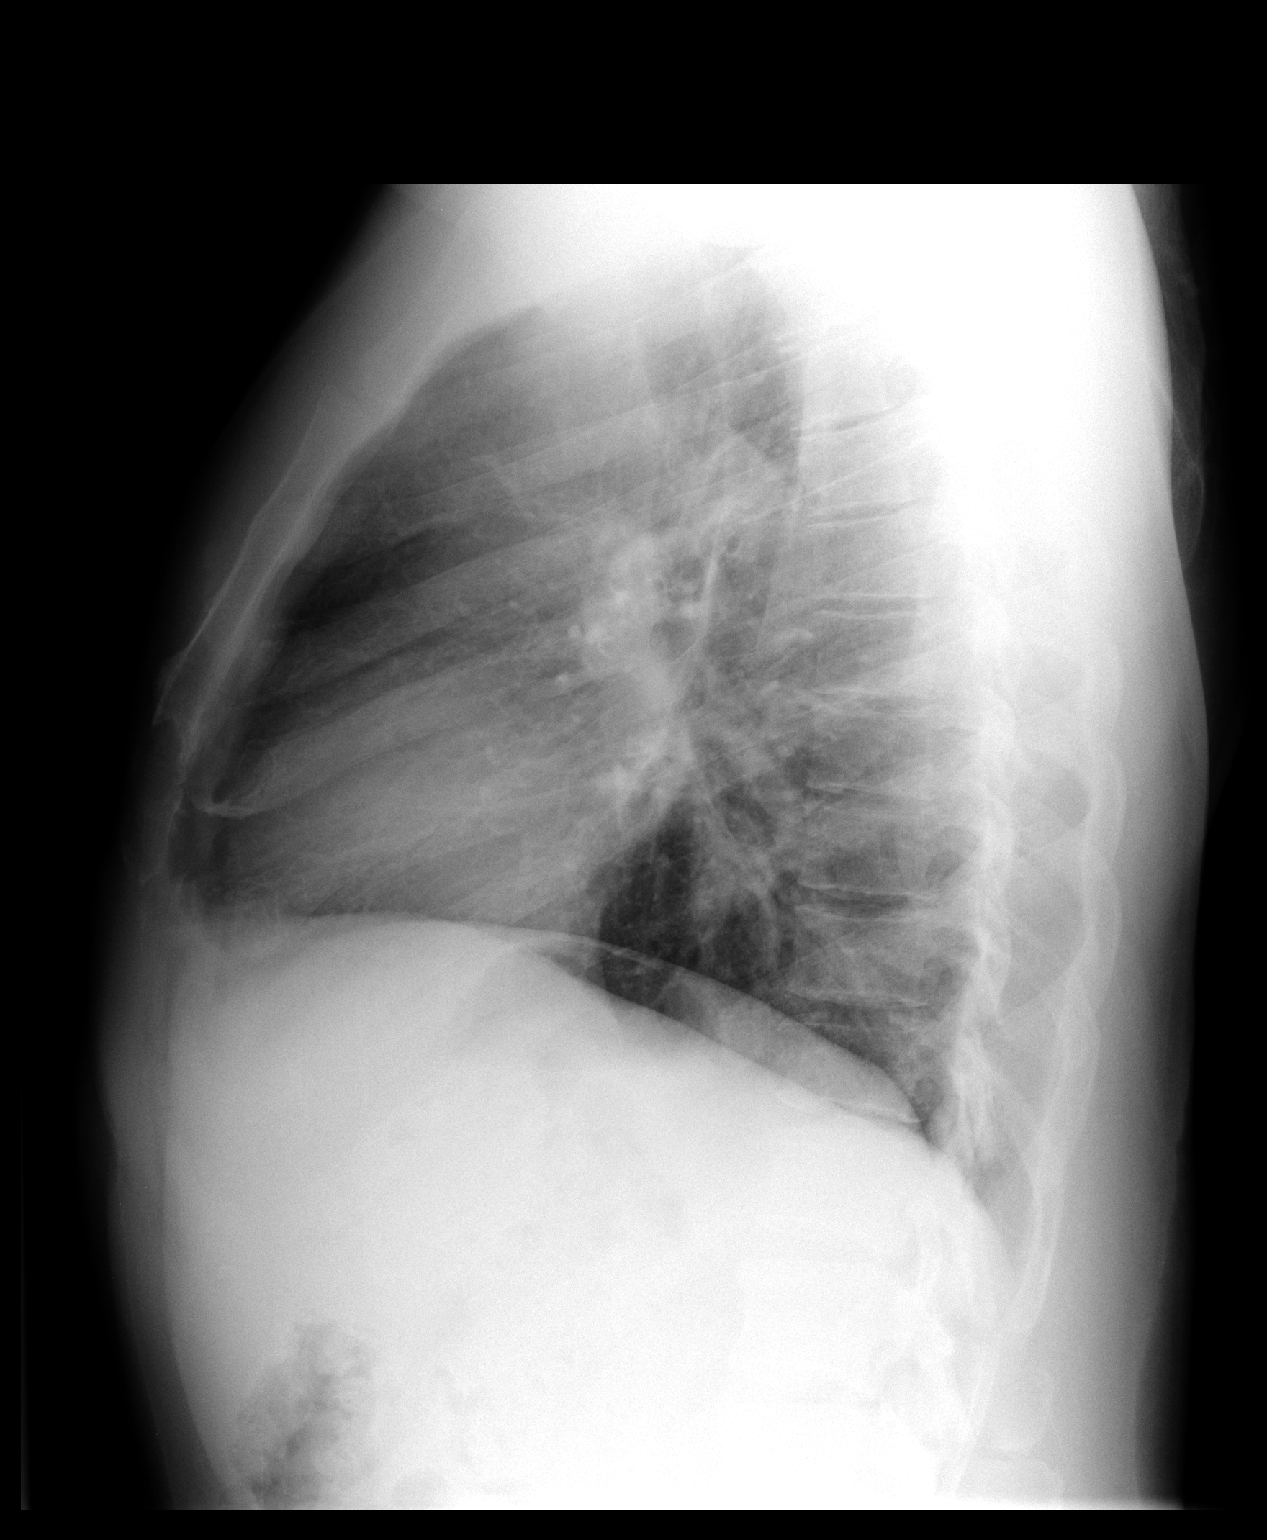

[2 of 2 positions shown; findings below may reference images not displayed]

FINDINGS: Mild peribronchial thickening. Heart and mediastinal
contours are within normal limits.  No focal opacities or
effusions.  No acute bony abnormality.
IMPRESSION: Mild bronchitic changes.  No focal opacities.

## 2011-07-26 ENCOUNTER — Telehealth: Payer: Self-pay | Admitting: Infectious Diseases

## 2011-07-26 NOTE — Telephone Encounter (Signed)
Called [facility] to check status on Viagra.  It has been mailed.  They are sending him an application to re-enroll.

## 2011-08-02 ENCOUNTER — Other Ambulatory Visit: Payer: Self-pay | Admitting: *Deleted

## 2011-08-02 ENCOUNTER — Telehealth: Payer: Self-pay | Admitting: Infectious Diseases

## 2011-08-02 DIAGNOSIS — N529 Male erectile dysfunction, unspecified: Secondary | ICD-10-CM

## 2011-08-02 MED ORDER — SILDENAFIL CITRATE 100 MG PO TABS: 100.0000 mg | ORAL_TABLET | Freq: Every day | ORAL | Status: DC | PRN

## 2011-08-02 NOTE — Telephone Encounter (Signed)
Called patient. Told him he needs to come in and complete a new application for [employer] [facility].  He said he is working a swing shift and will come in as soon as he can.

## 2011-08-03 ENCOUNTER — Telehealth: Payer: Self-pay | Admitting: Infectious Diseases

## 2011-08-03 NOTE — Telephone Encounter (Signed)
Mailed application to [employer] [facility] today

## 2011-08-10 ENCOUNTER — Telehealth: Payer: Self-pay | Admitting: Internal Medicine

## 2011-08-10 NOTE — Telephone Encounter (Signed)
Called [facility].  The application has been approved until 08-08-12.  Medication should ship out 08-20-11.  It will take 7 to 10 days to receive.  ID number is [id number]

## 2011-08-11 ENCOUNTER — Ambulatory Visit (INDEPENDENT_AMBULATORY_CARE_PROVIDER_SITE_OTHER): Payer: Self-pay | Admitting: *Deleted

## 2011-08-11 DIAGNOSIS — Z23 Encounter for immunization: Secondary | ICD-10-CM

## 2011-08-26 ENCOUNTER — Other Ambulatory Visit (INDEPENDENT_AMBULATORY_CARE_PROVIDER_SITE_OTHER): Payer: Self-pay

## 2011-08-26 ENCOUNTER — Other Ambulatory Visit: Payer: Self-pay | Admitting: Internal Medicine

## 2011-08-26 DIAGNOSIS — Z79899 Other long term (current) drug therapy: Secondary | ICD-10-CM

## 2011-08-26 DIAGNOSIS — Z113 Encounter for screening for infections with a predominantly sexual mode of transmission: Secondary | ICD-10-CM

## 2011-08-26 DIAGNOSIS — Z21 Asymptomatic human immunodeficiency virus [HIV] infection status: Secondary | ICD-10-CM

## 2011-08-26 DIAGNOSIS — B20 Human immunodeficiency virus [HIV] disease: Secondary | ICD-10-CM

## 2011-08-26 LAB — COMPREHENSIVE METABOLIC PANEL
ALT: 48 U/L (ref 0–53)
AST: 46 U/L — ABNORMAL HIGH (ref 0–37)
CO2: 28 mEq/L (ref 19–32)
Chloride: 104 mEq/L (ref 96–112)
Sodium: 141 mEq/L (ref 135–145)
Total Bilirubin: 0.4 mg/dL (ref 0.3–1.2)
Total Protein: 7.6 g/dL (ref 6.0–8.3)

## 2011-08-26 LAB — CBC WITH DIFFERENTIAL/PLATELET
Eosinophils Absolute: 0.1 10*3/uL (ref 0.0–0.7)
Lymphocytes Relative: 49 % — ABNORMAL HIGH (ref 12–46)
Lymphs Abs: 2.1 10*3/uL (ref 0.7–4.0)
Neutrophils Relative %: 39 % — ABNORMAL LOW (ref 43–77)
Platelets: 278 10*3/uL (ref 150–400)
RBC: 4.73 MIL/uL (ref 4.22–5.81)
WBC: 4.2 10*3/uL (ref 4.0–10.5)

## 2011-08-26 LAB — LIPID PANEL
LDL Cholesterol: 117 mg/dL — ABNORMAL HIGH (ref 0–99)
VLDL: 36 mg/dL (ref 0–40)

## 2011-08-30 LAB — HIV-1 RNA QUANT-NO REFLEX-BLD: HIV 1 RNA Quant: <20 copies/mL (ref <20)

## 2011-09-02 ENCOUNTER — Other Ambulatory Visit: Payer: Self-pay

## 2011-09-16 ENCOUNTER — Ambulatory Visit (INDEPENDENT_AMBULATORY_CARE_PROVIDER_SITE_OTHER): Payer: Self-pay | Admitting: Infectious Diseases

## 2011-09-16 ENCOUNTER — Ambulatory Visit: Payer: Self-pay

## 2011-09-16 ENCOUNTER — Encounter: Payer: Self-pay | Admitting: Infectious Diseases

## 2011-09-16 VITALS — BP 162/90 | HR 87 | Temp 98.3°F | Ht 69.0 in | Wt 165.0 lb

## 2011-09-16 DIAGNOSIS — I10 Essential (primary) hypertension: Secondary | ICD-10-CM

## 2011-09-16 DIAGNOSIS — B20 Human immunodeficiency virus [HIV] disease: Secondary | ICD-10-CM

## 2011-09-16 DIAGNOSIS — Z21 Asymptomatic human immunodeficiency virus [HIV] infection status: Secondary | ICD-10-CM

## 2011-09-16 MED ORDER — BENAZEPRIL HCL 10 MG PO TABS: 10.0000 mg | ORAL_TABLET | Freq: Every day | ORAL | Status: DC

## 2011-09-16 MED ORDER — HYDROCHLOROTHIAZIDE 12.5 MG PO CAPS: 12.5000 mg | ORAL_CAPSULE | Freq: Every day | ORAL | Status: DC

## 2011-09-16 NOTE — Progress Notes (Signed)
Patient ID: Isaac [name], male   DOB: 1960/12/03, 51 y.o.   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 He is HIV suppressed but CD4 is 380/28% and will repeat in three months. He has returned to rehab counseling and is at [employer] studying counseling. I hope that he can overcome this and told him that I would offer same through the [facility].   BP 162/90  Pulse 87  Temp(Src) 98.3 F (36.8 C) (Oral)  Ht 5\' 9"  (1.753 m)  Wt 165 lb (74.844 kg)  BMI 24.37 kg/m2 Some facial lipodystrophy since wt loss.BP repeated and same. No oral mucosal lesions and no adenopathy. Chest clear No murmurs or rubs  Assessment HIV suppressed but worry about cocaine use and intermittent ARV use HTN moderate elevation and adherence main issue.  Plan Encourage reconnecting with his rehab and adherence to medicatiions.          F/u 3 months Lina Sayre

## 2011-09-29 ENCOUNTER — Telehealth: Payer: Self-pay | Admitting: *Deleted

## 2011-09-29 NOTE — Telephone Encounter (Signed)
Pt is going to call [facility] back to see if they got the Tier 2 medications straigthened out.  His B/P is covered under Tier 2.

## 2011-10-01 ENCOUNTER — Ambulatory Visit: Payer: Self-pay

## 2011-11-08 ENCOUNTER — Telehealth: Payer: Self-pay | Admitting: Internal Medicine

## 2011-11-08 NOTE — Telephone Encounter (Signed)
Called [facility] for refill of Viagra 100 mg.  Will ship within the next 7 business days.

## 2011-11-18 ENCOUNTER — Telehealth: Payer: Self-pay | Admitting: Licensed Clinical Social Worker

## 2011-11-18 ENCOUNTER — Other Ambulatory Visit: Payer: Self-pay | Admitting: Licensed Clinical Social Worker

## 2011-11-18 NOTE — Telephone Encounter (Signed)
Patient's medication arrived from [facility], I called and left a message.

## 2011-11-26 ENCOUNTER — Other Ambulatory Visit: Payer: Self-pay | Admitting: *Deleted

## 2011-11-26 DIAGNOSIS — B20 Human immunodeficiency virus [HIV] disease: Secondary | ICD-10-CM

## 2011-11-26 MED ORDER — EFAVIRENZ-EMTRICITAB-TENOFOVIR 600-200-300 MG PO TABS: 1.0000 | ORAL_TABLET | Freq: Every day | ORAL | Status: DC

## 2012-01-06 ENCOUNTER — Other Ambulatory Visit: Payer: Self-pay

## 2012-01-13 ENCOUNTER — Other Ambulatory Visit: Payer: Self-pay

## 2012-01-14 ENCOUNTER — Emergency Department (HOSPITAL_COMMUNITY)
Admission: EM | Admit: 2012-01-14 | Discharge: 2012-01-14 | Disposition: A | Discharge status: Home / Self Care | Payer: Self-pay | Attending: Emergency Medicine | Admitting: Emergency Medicine

## 2012-01-14 ENCOUNTER — Encounter (HOSPITAL_COMMUNITY): Payer: Self-pay | Admitting: Surgery

## 2012-01-14 ENCOUNTER — Emergency Department (HOSPITAL_COMMUNITY): Admission: EM | Admit: 2012-01-14 | Discharge: 2012-01-14 | Discharge status: Against Medical Advice | Payer: Self-pay

## 2012-01-14 ENCOUNTER — Emergency Department (HOSPITAL_COMMUNITY): Payer: Self-pay

## 2012-01-14 DIAGNOSIS — I10 Essential (primary) hypertension: Secondary | ICD-10-CM | POA: Insufficient documentation

## 2012-01-14 DIAGNOSIS — S1190XA Unspecified open wound of unspecified part of neck, initial encounter: Secondary | ICD-10-CM | POA: Insufficient documentation

## 2012-01-14 DIAGNOSIS — S1191XA Laceration without foreign body of unspecified part of neck, initial encounter: Secondary | ICD-10-CM

## 2012-01-14 HISTORY — DX: Essential (primary) hypertension: I10

## 2012-01-14 MED ORDER — TETANUS-DIPHTH-ACELL PERTUSSIS 5-2.5-18.5 LF-MCG/0.5 IM SUSP
0.5000 mL | Freq: Once | INTRAMUSCULAR | Status: AC
  Administered 2012-01-14: 0.5 mL via INTRAMUSCULAR
  Filled 2012-01-14: qty 0.5

## 2012-01-14 MED ORDER — CEPHALEXIN 500 MG PO CAPS: 500.0000 mg | ORAL_CAPSULE | Freq: Four times a day (QID) | ORAL | Status: AC

## 2012-01-14 MED ORDER — IOHEXOL 350 MG/ML SOLN
80.0000 mL | Freq: Once | INTRAVENOUS | Status: AC | PRN
  Administered 2012-01-14: 80 mL via INTRAVENOUS

## 2012-01-14 NOTE — ED Notes (Signed)
Patient transported to CT 
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
## 2012-01-14 NOTE — ED Notes (Signed)
PT. SLEEPING WITH NO PAIN OR DISCOMFORT , RESPIRATIONS UNLABORED , IV SITE UNREMARKABLE.
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
## 2012-01-14 NOTE — Discharge Instructions (Signed)
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
## 2012-01-14 NOTE — ED Notes (Signed)
[employer] at bedside 
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
## 2012-01-16 NOTE — ED Provider Notes (Signed)
I saw and evaluated the patient, reviewed the resident's note and I agree with the findings and plan. CRITICAL CARE Performed by: Dione Booze   Total critical care time: 40 minutes  Critical care time was exclusive of separately billable procedures and treating other patients.  Critical care was necessary to treat or prevent imminent or life-threatening deterioration.  Critical care was time spent personally by me on the following activities: development of treatment plan with patient and/or surrogate as well as nursing, discussions with consultants, evaluation of patient's response to treatment, examination of patient, obtaining history from patient or surrogate, ordering and performing treatments and interventions, ordering and review of laboratory studies, ordering and review of radiographic studies, pulse oximetry and re-evaluation of patient's condition.   Dione Booze, MD 01/16/12 [phone]

## 2012-01-17 ENCOUNTER — Encounter: Payer: Self-pay | Admitting: Infectious Diseases

## 2012-01-17 ENCOUNTER — Other Ambulatory Visit (INDEPENDENT_AMBULATORY_CARE_PROVIDER_SITE_OTHER): Payer: Self-pay

## 2012-01-17 DIAGNOSIS — I10 Essential (primary) hypertension: Secondary | ICD-10-CM

## 2012-01-17 DIAGNOSIS — B20 Human immunodeficiency virus [HIV] disease: Secondary | ICD-10-CM

## 2012-01-17 LAB — CBC WITH DIFFERENTIAL/PLATELET
Basophils Absolute: 0 10*3/uL (ref 0.0–0.1)
Eosinophils Relative: 1 % (ref 0–5)
HCT: 40.7 % (ref 39.0–52.0)
Hemoglobin: 13.2 g/dL (ref 13.0–17.0)
Lymphocytes Relative: 47 % — ABNORMAL HIGH (ref 12–46)
Lymphs Abs: 2 10*3/uL (ref 0.7–4.0)
MCV: 86.6 fL (ref 78.0–100.0)
Monocytes Absolute: 0.4 10*3/uL (ref 0.1–1.0)
Monocytes Relative: 8 % (ref 3–12)
RDW: 12 % (ref 11.5–15.5)
WBC: 4.2 10*3/uL (ref 4.0–10.5)

## 2012-01-17 LAB — COMPREHENSIVE METABOLIC PANEL
Albumin: 4.1 g/dL (ref 3.5–5.2)
BUN: 20 mg/dL (ref 6–23)
CO2: 25 mEq/L (ref 19–32)
Calcium: 9 mg/dL (ref 8.4–10.5)
Chloride: 104 mEq/L (ref 96–112)
Creat: 1.08 mg/dL (ref 0.50–1.35)
Glucose, Bld: 117 mg/dL — ABNORMAL HIGH (ref 70–99)
Potassium: 4.9 mEq/L (ref 3.5–5.3)

## 2012-01-18 LAB — T-HELPER CELL (CD4) - (RCID CLINIC ONLY)
CD4 % Helper T Cell: 18 % — ABNORMAL LOW (ref 33–55)
CD4 T Cell Abs: 320 uL — ABNORMAL LOW (ref 400–2700)

## 2012-01-18 LAB — HIV-1 RNA QUANT-NO REFLEX-BLD: HIV 1 RNA Quant: <20 copies/mL (ref <20)

## 2012-01-20 ENCOUNTER — Ambulatory Visit: Payer: Self-pay | Admitting: Infectious Diseases

## 2012-01-21 ENCOUNTER — Encounter: Payer: Self-pay | Admitting: Infectious Diseases

## 2012-01-21 ENCOUNTER — Ambulatory Visit (INDEPENDENT_AMBULATORY_CARE_PROVIDER_SITE_OTHER): Payer: Self-pay | Admitting: Infectious Diseases

## 2012-01-21 VITALS — BP 132/87 | HR 80 | Temp 98.0°F | Wt 159.0 lb

## 2012-01-21 DIAGNOSIS — B20 Human immunodeficiency virus [HIV] disease: Secondary | ICD-10-CM

## 2012-01-21 DIAGNOSIS — S1191XA Laceration without foreign body of unspecified part of neck, initial encounter: Secondary | ICD-10-CM

## 2012-01-21 DIAGNOSIS — S1190XA Unspecified open wound of unspecified part of neck, initial encounter: Secondary | ICD-10-CM

## 2012-01-21 DIAGNOSIS — B1710 Acute hepatitis C without hepatic coma: Secondary | ICD-10-CM

## 2012-01-21 DIAGNOSIS — I10 Essential (primary) hypertension: Secondary | ICD-10-CM
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
## 2012-02-02 ENCOUNTER — Telehealth: Payer: Self-pay | Admitting: Internal Medicine

## 2012-02-02 ENCOUNTER — Telehealth: Payer: Self-pay | Admitting: *Deleted

## 2012-02-02 NOTE — Telephone Encounter (Signed)
Called [facility] and re-ordered Mr. Cardell Peach Viagra.  Should be here within the next 7 to 10 business days.

## 2012-02-02 NOTE — Telephone Encounter (Signed)
Patient came in to see if his PAP medication has come in advised him it has not. He then advised he needs to let Pam know so she can reorder it from patient assistance.

## 2012-02-09 ENCOUNTER — Other Ambulatory Visit: Payer: Self-pay | Admitting: *Deleted

## 2012-02-09 DIAGNOSIS — N529 Male erectile dysfunction, unspecified: Secondary | ICD-10-CM

## 2012-02-09 MED ORDER — SILDENAFIL CITRATE 100 MG PO TABS: 100.0000 mg | ORAL_TABLET | Freq: Every day | ORAL | Status: DC | PRN

## 2012-02-09 NOTE — Telephone Encounter (Signed)
I tried to reach him at home #. It is not working. I called the work # & got [employer]. He no longer resides there. Will wait for him to come in or call us.

## 2012-03-08 ENCOUNTER — Emergency Department (HOSPITAL_COMMUNITY): Payer: Self-pay

## 2012-03-08 ENCOUNTER — Encounter (HOSPITAL_COMMUNITY): Payer: Self-pay | Admitting: *Deleted

## 2012-03-08 ENCOUNTER — Other Ambulatory Visit: Payer: Self-pay

## 2012-03-08 ENCOUNTER — Inpatient Hospital Stay (HOSPITAL_COMMUNITY): Payer: Self-pay

## 2012-03-08 ENCOUNTER — Inpatient Hospital Stay (HOSPITAL_COMMUNITY)
Admission: EM | Admit: 2012-03-08 | Discharge: 2012-03-10 | DRG: 100 | Disposition: A | Discharge status: Home / Self Care | Payer: Self-pay | Attending: Internal Medicine | Admitting: Internal Medicine

## 2012-03-08 ENCOUNTER — Inpatient Hospital Stay (HOSPITAL_COMMUNITY)
Admit: 2012-03-08 | Discharge: 2012-03-08 | Disposition: A | Discharge status: Home / Self Care | Payer: Self-pay | Attending: Internal Medicine | Admitting: Internal Medicine

## 2012-03-08 DIAGNOSIS — F17200 Nicotine dependence, unspecified, uncomplicated: Secondary | ICD-10-CM | POA: Diagnosis present

## 2012-03-08 DIAGNOSIS — Z21 Asymptomatic human immunodeficiency virus [HIV] infection status: Secondary | ICD-10-CM | POA: Diagnosis present

## 2012-03-08 DIAGNOSIS — Q283 Other malformations of cerebral vessels: Secondary | ICD-10-CM

## 2012-03-08 DIAGNOSIS — R569 Unspecified convulsions: Principal | ICD-10-CM | POA: Diagnosis present

## 2012-03-08 DIAGNOSIS — R5081 Fever presenting with conditions classified elsewhere: Secondary | ICD-10-CM | POA: Diagnosis present

## 2012-03-08 DIAGNOSIS — F1010 Alcohol abuse, uncomplicated: Secondary | ICD-10-CM

## 2012-03-08 DIAGNOSIS — Q282 Arteriovenous malformation of cerebral vessels: Secondary | ICD-10-CM

## 2012-03-08 DIAGNOSIS — B20 Human immunodeficiency virus [HIV] disease: Secondary | ICD-10-CM

## 2012-03-08 DIAGNOSIS — Z79899 Other long term (current) drug therapy: Secondary | ICD-10-CM

## 2012-03-08 DIAGNOSIS — F1410 Cocaine abuse, uncomplicated: Secondary | ICD-10-CM | POA: Diagnosis present

## 2012-03-08 DIAGNOSIS — I10 Essential (primary) hypertension: Secondary | ICD-10-CM | POA: Diagnosis present

## 2012-03-08 DIAGNOSIS — B1920 Unspecified viral hepatitis C without hepatic coma: Secondary | ICD-10-CM | POA: Diagnosis present

## 2012-03-08 HISTORY — DX: Human immunodeficiency virus (HIV) disease: B20

## 2012-03-08 HISTORY — DX: Asymptomatic human immunodeficiency virus (hiv) infection status: Z21

## 2012-03-08 LAB — MAGNESIUM: Magnesium: 2 mg/dL (ref 1.5–2.5)

## 2012-03-08 LAB — COMPREHENSIVE METABOLIC PANEL
AST: 132 U/L — ABNORMAL HIGH (ref 0–37)
Albumin: 3.4 g/dL — ABNORMAL LOW (ref 3.5–5.2)
Alkaline Phosphatase: 75 U/L (ref 39–117)
CO2: 26 mEq/L (ref 19–32)
Chloride: 100 mEq/L (ref 96–112)
Creatinine, Ser: 0.84 mg/dL (ref 0.50–1.35)
GFR calc non Af Amer: >90 mL/min (ref >90)
Potassium: 3.8 mEq/L (ref 3.5–5.1)
Total Bilirubin: 0.3 mg/dL (ref 0.3–1.2)

## 2012-03-08 LAB — RAPID URINE DRUG SCREEN, HOSP PERFORMED
Barbiturates: NOT DETECTED
Cocaine: NOT DETECTED
Opiates: NOT DETECTED
Tetrahydrocannabinol: POSITIVE — AB

## 2012-03-08 LAB — LIPID PANEL
HDL: 47 mg/dL (ref >39)
Total CHOL/HDL Ratio: 3.1 RATIO
Triglycerides: 208 mg/dL — ABNORMAL HIGH (ref <150)

## 2012-03-08 LAB — CBC WITH DIFFERENTIAL/PLATELET
Basophils Absolute: 0 10*3/uL (ref 0.0–0.1)
Basophils Relative: 0 % (ref 0–1)
Eosinophils Absolute: 0 10*3/uL (ref 0.0–0.7)
MCH: 28.7 pg (ref 26.0–34.0)
MCHC: 33.2 g/dL (ref 30.0–36.0)
Monocytes Absolute: 0.3 10*3/uL (ref 0.1–1.0)
Monocytes Relative: 9 % (ref 3–12)
Neutro Abs: 1.8 10*3/uL (ref 1.7–7.7)
Neutrophils Relative %: 52 % (ref 43–77)
RDW: 12.4 % (ref 11.5–15.5)

## 2012-03-08 LAB — URINALYSIS, ROUTINE W REFLEX MICROSCOPIC
Glucose, UA: NEGATIVE mg/dL
Ketones, ur: NEGATIVE mg/dL
Leukocytes, UA: NEGATIVE
pH: 6 (ref 5.0–8.0)

## 2012-03-08 LAB — CK: Total CK: 633 U/L — ABNORMAL HIGH (ref 7–232)

## 2012-03-08 MED ORDER — SODIUM CHLORIDE 0.9 % IV SOLN
INTRAVENOUS | Status: AC
  Administered 2012-03-08: 09:00:00 via INTRAVENOUS

## 2012-03-08 MED ORDER — ADULT MULTIVITAMIN W/MINERALS CH
1.0000 | ORAL_TABLET | Freq: Every day | ORAL | Status: DC
  Administered 2012-03-08 – 2012-03-10 (×3): 1 via ORAL
  Filled 2012-03-08 (×3): qty 1

## 2012-03-08 MED ORDER — THIAMINE HCL 100 MG/ML IJ SOLN
Freq: Once | INTRAVENOUS | Status: AC
  Administered 2012-03-08: 13:00:00 via INTRAVENOUS
  Filled 2012-03-08: qty 1000

## 2012-03-08 MED ORDER — HYDROCHLOROTHIAZIDE 12.5 MG PO CAPS
12.5000 mg | ORAL_CAPSULE | Freq: Every day | ORAL | Status: DC
  Administered 2012-03-08 – 2012-03-10 (×3): 12.5 mg via ORAL
  Filled 2012-03-08 (×3): qty 1

## 2012-03-08 MED ORDER — SODIUM CHLORIDE 0.9 % IV SOLN
Freq: Once | INTRAVENOUS | Status: AC
  Administered 2012-03-08: 1000 mL via INTRAVENOUS

## 2012-03-08 MED ORDER — LORAZEPAM 2 MG/ML IJ SOLN
0.0000 mg | Freq: Two times a day (BID) | INTRAMUSCULAR | Status: DC
  Administered 2012-03-10: 2 mg via INTRAVENOUS
  Filled 2012-03-08: qty 1

## 2012-03-08 MED ORDER — ONDANSETRON HCL 4 MG/2ML IJ SOLN
4.0000 mg | Freq: Once | INTRAMUSCULAR | Status: AC
  Administered 2012-03-08: 4 mg via INTRAVENOUS
  Filled 2012-03-08: qty 2

## 2012-03-08 MED ORDER — LORAZEPAM 2 MG/ML IJ SOLN: 1.0000 mg | Freq: Four times a day (QID) | INTRAMUSCULAR | Status: DC | PRN

## 2012-03-08 MED ORDER — LORAZEPAM 2 MG/ML IJ SOLN: 0.0000 mg | Freq: Four times a day (QID) | INTRAMUSCULAR | Status: AC

## 2012-03-08 MED ORDER — ACETAMINOPHEN 650 MG RE SUPP: 650.0000 mg | Freq: Four times a day (QID) | RECTAL | Status: DC | PRN

## 2012-03-08 MED ORDER — ACETAMINOPHEN 325 MG PO TABS
650.0000 mg | ORAL_TABLET | Freq: Four times a day (QID) | ORAL | Status: DC | PRN
  Administered 2012-03-08 – 2012-03-09 (×2): 650 mg via ORAL
  Filled 2012-03-08 (×2): qty 2

## 2012-03-08 MED ORDER — LORAZEPAM 1 MG PO TABS: 1.0000 mg | ORAL_TABLET | Freq: Four times a day (QID) | ORAL | Status: DC | PRN

## 2012-03-08 MED ORDER — EFAVIRENZ-EMTRICITAB-TENOFOVIR 600-200-300 MG PO TABS
1.0000 | ORAL_TABLET | Freq: Every day | ORAL | Status: DC
  Administered 2012-03-08 – 2012-03-09 (×2): 1 via ORAL
  Filled 2012-03-08 (×3): qty 1

## 2012-03-08 MED ORDER — GADOBENATE DIMEGLUMINE 529 MG/ML IV SOLN
14.0000 mL | Freq: Once | INTRAVENOUS | Status: AC | PRN
  Administered 2012-03-08: 14 mL via INTRAVENOUS

## 2012-03-08 MED ORDER — NICOTINE 21 MG/24HR TD PT24
21.0000 mg | MEDICATED_PATCH | Freq: Every day | TRANSDERMAL | Status: DC
  Administered 2012-03-08 – 2012-03-10 (×3): 21 mg via TRANSDERMAL
  Filled 2012-03-08 (×3): qty 1

## 2012-03-08 MED ORDER — BENAZEPRIL HCL 10 MG PO TABS
10.0000 mg | ORAL_TABLET | Freq: Every day | ORAL | Status: DC
  Administered 2012-03-08 – 2012-03-10 (×3): 10 mg via ORAL
  Filled 2012-03-08 (×3): qty 1

## 2012-03-08 MED ORDER — THIAMINE HCL 100 MG/ML IJ SOLN
100.0000 mg | Freq: Every day | INTRAMUSCULAR | Status: DC
  Filled 2012-03-08 (×3): qty 1

## 2012-03-08 MED ORDER — VITAMIN B-1 100 MG PO TABS
100.0000 mg | ORAL_TABLET | Freq: Every day | ORAL | Status: DC
  Administered 2012-03-08 – 2012-03-10 (×3): 100 mg via ORAL
  Filled 2012-03-08 (×3): qty 1

## 2012-03-08 MED ORDER — FOLIC ACID 1 MG PO TABS
1.0000 mg | ORAL_TABLET | Freq: Every day | ORAL | Status: DC
  Administered 2012-03-08 – 2012-03-10 (×3): 1 mg via ORAL
  Filled 2012-03-08 (×3): qty 1

## 2012-03-08 MED ORDER — ACETAMINOPHEN 325 MG PO TABS
650.0000 mg | ORAL_TABLET | Freq: Once | ORAL | Status: AC
  Administered 2012-03-08: 650 mg via ORAL
  Filled 2012-03-08: qty 2

## 2012-03-08 MED ORDER — ENOXAPARIN SODIUM 40 MG/0.4ML ~~LOC~~ SOLN
40.0000 mg | SUBCUTANEOUS | Status: DC
  Administered 2012-03-08 – 2012-03-10 (×3): 40 mg via SUBCUTANEOUS
  Filled 2012-03-08 (×3): qty 0.4

## 2012-03-08 MED ORDER — LORAZEPAM 2 MG/ML IJ SOLN: 2.0000 mg | INTRAMUSCULAR | Status: DC | PRN

## 2012-03-08 MED ORDER — LORAZEPAM 2 MG/ML IJ SOLN
2.0000 mg | Freq: Once | INTRAMUSCULAR | Status: AC
  Administered 2012-03-08: 2 mg via INTRAVENOUS
  Filled 2012-03-08: qty 1

## 2012-03-08 MED ORDER — SODIUM CHLORIDE 0.9 % IJ SOLN
3.0000 mL | Freq: Two times a day (BID) | INTRAMUSCULAR | Status: DC
  Administered 2012-03-08 – 2012-03-10 (×3): 3 mL via INTRAVENOUS

## 2012-03-08 MED ORDER — LEVETIRACETAM 500 MG PO TABS
500.0000 mg | ORAL_TABLET | Freq: Two times a day (BID) | ORAL | Status: DC
  Administered 2012-03-08 – 2012-03-10 (×5): 500 mg via ORAL
  Filled 2012-03-08 (×6): qty 1

## 2012-03-08 NOTE — ED Notes (Signed)
ZOX:[id number]<AV> Expected date:03/08/12<BR> Expected time: 3:14 AM<BR> Means of arrival:Ambulance<BR> Comments:<BR> Seizure

## 2012-03-08 NOTE — Progress Notes (Signed)
Portable EEG completed at [facility]. 

## 2012-03-08 NOTE — Consult Note (Signed)
Date of Admission:  03/08/2012  Date of Consult:  03/08/2012  Reason for Consult: HIV+, Seizure Referring Physician: Dhungel  Impression/Recommendation AVM HIV+ Seizure Drug abuse ETOH abuse Temp 100.5  Would- recheck CD4 and VL Check BCx Neurosurgical eval (vs IR eval?) No change in ART for now. Hold anbx for now Comment- suspect his low grade temp is due to his seizure which are in turn due to his AVM. His seizure also could be due to drug/etoh withdrawal.  He also has elevated CPK (probably also due to seizure and muscle injury). Would watch off anbx. His previous CD4 argues against a OI. The treatment of his AVm is not clear to me- would be helpful to get neurosurgical input (as to whether this needs resection, coil or watchful waiting).  Dr Orvan Falconer is available if questions, Thank you so much for this interesting consult,   Isaac [name] [phone]  Isaac [name] is an 51 y.o. male.  HPI: 51 yo M with hx of HIV + dx 1989, last CD4 320 and VL <20, maintained on atripla for last 3 years. Was brought to ED today after having 2 wtinesed seizure episodes at home. He states he has had headaches, fever and chills for the last 3-4 months. No photophobia, no neck stiffness. Does complain of should pain. CT head showed: Serpentine vessels in communication with a 2.9 cm hyperdense left frontal lobe mass, favored to represent an arteriovenous malformation. This was confirmed on MRI.  UDS showed THC but otherwise (-).     HIV 1 RNA Quant (copies/mL)  Date Value  01/17/2012 <20   08/26/2011 <20   03/25/2011 <20      CD4 T Cell Abs (cmm)  Date Value  01/17/2012 320*  08/26/2011 380*  03/25/2011 720       Past Medical History  Diagnosis Date  . Hypertension   . HIV (human immunodeficiency virus infection)     History reviewed. No pertinent past surgical history. Allergies:  No Known Allergies  Medications:  Scheduled:   . sodium chloride   Intravenous Once  . sodium  chloride   Intravenous STAT  . acetaminophen  650 mg Oral Once  . benazepril  10 mg Oral Daily  . efavirenz-emtricitabine-tenofovir  1 tablet Oral QHS  . enoxaparin  40 mg Subcutaneous Q24H  . folic acid  1 mg Oral Daily  . hydrochlorothiazide  12.5 mg Oral Daily  . levETIRAcetam  500 mg Oral BID  . LORazepam  0-4 mg Intravenous Q6H   Followed by  . LORazepam  0-4 mg Intravenous Q12H  . LORazepam  2 mg Intravenous Once  . multivitamin with minerals  1 tablet Oral Daily  . nicotine  21 mg Transdermal Daily  . ondansetron  4 mg Intravenous Once  . general admission iv infusion   Intravenous Once  . sodium chloride  3 mL Intravenous Q12H  . thiamine  100 mg Oral Daily   Or  . thiamine  100 mg Intravenous Daily    Social History:  reports that he has been smoking Cigarettes.  He has a 35 pack-year smoking history. He has never used smokeless tobacco. He reports that he drinks about 5 ounces of alcohol per week. He reports that he uses illicit drugs (Marijuana) about once per week.  History reviewed. No pertinent family history.  General ROS: no photophobia, no neck stiffness, no cough, normal BM, normal urination, no rashes, no paresthesias, no weakness.   Blood pressure 151/88, pulse 74, temperature  98.1 F (36.7 C), temperature source Oral, resp. rate 20, height 5\' 9"  (1.753 m), weight 69.083 kg (152 lb 4.8 oz), SpO2 98.00%. General appearance: alert, cooperative and no distress Eyes: negative findings: pupils equal, round, reactive to light and accomodation and no photophobia Throat: normal findings: soft palate, uvula, and tonsils normal and oropharynx pink & moist without lesions or evidence of thrush Neck: no adenopathy and non-tender, FROM Lungs: clear to auscultation bilaterally Heart: regular rate and rhythm Abdomen: normal findings: bowel sounds normal and soft, non-tender Extremities: edema none Neurologic: Mental status: Alert, oriented, thought content  appropriate Cranial nerves: normal Sensory: normal light touch LE Motor: normal plantar flexion, dorsi-flexion  Coordination: grossly normal   Results for orders placed during the hospital encounter of 03/08/12 (from the past 48 hour(s))  CBC WITH DIFFERENTIAL     Status: Abnormal   Collection Time   03/08/12  4:30 AM      Component Value Range Comment   WBC 3.6 (*) 4.0 - 10.5 K/uL    RBC 4.57  4.22 - 5.81 MIL/uL    Hemoglobin 13.1  13.0 - 17.0 g/dL    HCT 16.1  09.6 - 04.5 %    MCV 86.4  78.0 - 100.0 fL    MCH 28.7  26.0 - 34.0 pg    MCHC 33.2  30.0 - 36.0 g/dL    RDW 40.9  81.1 - 91.4 %    Platelets 289  150 - 400 K/uL    Neutrophils Relative 52  43 - 77 %    Neutro Abs 1.8  1.7 - 7.7 K/uL    Lymphocytes Relative 38  12 - 46 %    Lymphs Abs 1.4  0.7 - 4.0 K/uL    Monocytes Relative 9  3 - 12 %    Monocytes Absolute 0.3  0.1 - 1.0 K/uL    Eosinophils Relative 1  0 - 5 %    Eosinophils Absolute 0.0  0.0 - 0.7 K/uL    Basophils Relative 0  0 - 1 %    Basophils Absolute 0.0  0.0 - 0.1 K/uL   ETHANOL     Status: Normal   Collection Time   03/08/12  4:30 AM      Component Value Range Comment   Alcohol, Ethyl (B) <11  0 - 11 mg/dL   COMPREHENSIVE METABOLIC PANEL     Status: Abnormal   Collection Time   03/08/12  4:30 AM      Component Value Range Comment   Sodium 135  135 - 145 mEq/L    Potassium 3.8  3.5 - 5.1 mEq/L    Chloride 100  96 - 112 mEq/L    CO2 26  19 - 32 mEq/L    Glucose, Bld 97  70 - 99 mg/dL    BUN 18  6 - 23 mg/dL    Creatinine, Ser 7.82  0.50 - 1.35 mg/dL    Calcium 8.9  8.4 - 95.6 mg/dL    Total Protein 7.7  6.0 - 8.3 g/dL    Albumin 3.4 (*) 3.5 - 5.2 g/dL    AST 213 (*) 0 - 37 U/L    ALT 117 (*) 0 - 53 U/L    Alkaline Phosphatase 75  39 - 117 U/L    Total Bilirubin 0.3  0.3 - 1.2 mg/dL    GFR calc non Af Amer >90  >90 mL/min    GFR calc Af Amer >90  >90 mL/min  URINE RAPID DRUG SCREEN (HOSP PERFORMED)     Status: Abnormal   Collection Time   03/08/12   6:32 AM      Component Value Range Comment   Opiates NONE DETECTED  NONE DETECTED    Cocaine NONE DETECTED  NONE DETECTED    Benzodiazepines NONE DETECTED  NONE DETECTED    Amphetamines NONE DETECTED  NONE DETECTED    Tetrahydrocannabinol POSITIVE (*) NONE DETECTED    Barbiturates NONE DETECTED  NONE DETECTED   MAGNESIUM     Status: Normal   Collection Time   03/08/12  9:15 AM      Component Value Range Comment   Magnesium 2.0  1.5 - 2.5 mg/dL   CK     Status: Abnormal   Collection Time   03/08/12  9:15 AM      Component Value Range Comment   Total CK 633 (*) 7 - 232 U/L   URINALYSIS, ROUTINE W REFLEX MICROSCOPIC     Status: Abnormal   Collection Time   03/08/12  2:50 PM      Component Value Range Comment   Color, Urine YELLOW  YELLOW    APPearance CLOUDY (*) CLEAR    Specific Gravity, Urine 1.019  1.005 - 1.030    pH 6.0  5.0 - 8.0    Glucose, UA NEGATIVE  NEGATIVE mg/dL    Hgb urine dipstick NEGATIVE  NEGATIVE    Bilirubin Urine NEGATIVE  NEGATIVE    Ketones, ur NEGATIVE  NEGATIVE mg/dL    Protein, ur NEGATIVE  NEGATIVE mg/dL    Urobilinogen, UA 0.2  0.0 - 1.0 mg/dL    Nitrite NEGATIVE  NEGATIVE    Leukocytes, UA NEGATIVE  NEGATIVE MICROSCOPIC NOT DONE ON URINES WITH NEGATIVE PROTEIN, BLOOD, LEUKOCYTES, NITRITE, OR GLUCOSE <1000 mg/dL.      Component Value Date/Time   SDES FLUID SYNOVIAL KNEE RIGHT 11/25/2009 1905   SPECREQUEST FLUID CAME IN SYR @ [facility] 11/25/2009 1905   CULT NO GROWTH 3 DAYS 11/25/2009 1905   REPTSTATUS 11/29/2009 FINAL 11/25/2009 1905   Dg Chest 2 View  03/08/2012  *RADIOLOGY REPORT*  Clinical Data: Fever.  Rule out pneumonia.  Aspiration.  CHEST - 2 VIEW  Comparison: 10/14/2009  Findings: The heart size and mediastinal contours are within normal limits.  Both lungs are clear.  The visualized skeletal structures are unremarkable.  IMPRESSION: Negative exam.  Original Report Authenticated By: Rosealee Albee, M.D.   Ct Head Wo Contrast  03/08/2012  *RADIOLOGY  REPORT*  Clinical Data: Hypertension, seizure.  CT HEAD WITHOUT CONTRAST  Technique:  Contiguous axial images were obtained from the base of the skull through the vertex without contrast.  Comparison: None.  Findings: Serpentine vessels in communication with a 2.9 cm hyperdense mass centered in the inferior anterior left frontal lobe.  The attenuation of the mass is isointense to the feeding/draining vessels. No internal calcifications. There may be minimal surrounding hypoattenuation however no mass effect, midline shift, or definite hemorrhage.  No hydrocephalous or intraventricular hemorrhage.  No abnormal extra-axial fluid collection. The visualized paranasal sinuses and mastoid air cells are predominately clear.  IMPRESSION: Serpentine vessels in communication with a 2.9 cm hyperdense left frontal lobe mass, favored to represent an arteriovenous malformation. Recommend contrast enhanced MRI.  Discussed via telephone with Dr. Freida Busman at 04:58 on 03/08/2012.  Original Report Authenticated By: Waneta Martins, M.D.   [name] ZO Contrast  03/08/2012  *RADIOLOGY REPORT*  Clinical Data: Witnessed seizures.  Possible avium over the left lobe.  Abnormal CT scan.  MRI HEAD WITHOUT AND WITH CONTRAST  Technique:  Multiplanar, multiecho pulse sequences of the brain and surrounding structures were obtained according to standard protocol without and with intravenous contrast  Contrast: 14mL MULTIHANCE GADOBENATE DIMEGLUMINE 529 MG/ML IV SOLN  Comparison: CT head without contrast 03/08/01/2013.  Findings: An arterial venous malformation is centered in the medial anterior left frontal lobe.  There are some vessels which cross the falx.  The lesion is presumedly fed from the anterior cerebral artery on the left.  The dominant draining vein is a large vein which extends posteriorly within the subarachnoid space and drains to the vein of Galen.  The draining vein sits adjacent to the hippocampus.  There is no definite  abnormal signal within the left hippocampus or medial temporal lobe.  There is an additional draining vein anteriorly which extends to the anterior aspect of the superior sagittal sinus.  A prominent central nidus is evident at the origin of the large posterior draining vein.  The primary component of the AVM measures 3.7 x 2.6 x 3.1 cm.  There is minimal FLAIR hyperintensity surrounding the AVM.  No acute infarct is present.  There is no evidence for hemorrhage.  The ventricles are of normal size.  No significant extra-axial fluid collection is present.  There is no other significant white matter disease for age.  Flow is present in the major intracranial arteries.  The globes orbits are intact.  Mild mucosal thickening is present in the left maxillary sinus.  The mastoid air cells are clear bilaterally.  IMPRESSION:  1.  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  This is a Spetler-Martin grade 3 AVM, receiving 2 points for size and 1 point for deep drainage. 2.  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 3.  The dominant posterior draining vein courses just above the left hippocampus.  Although there is no abnormal signal in the left hippocampus, this could be related to the patient's seizures.  These results were called by telephone on 03/08/2012  at  12:20 p.m. to  Dr. Gonzella Lex, who verbally acknowledged these results.  Original Report Authenticated By: Jamesetta Orleans. MATTERN, M.D.   No results found for this or any previous visit (from the past 240 hour(s)).    03/08/2012, 3:59 PM     LOS: 0 days

## 2012-03-08 NOTE — ED Notes (Signed)
Patient transported to CT 
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
## 2012-03-08 NOTE — Progress Notes (Signed)
   CARE MANAGEMENT NOTE 03/08/2012  Patient:  Isaac [name], Isaac [name]   Account Number:  [id number]  Date Initiated:  03/08/2012  Documentation initiated by:  Jiles Crocker  Subjective/Objective Assessment:   ADMITTED WITH SEIZURES     Action/Plan:   PATIENT RECEIVES REHAB AT [facility]; SOCIAL WORKER REFERRAL PLACED   Anticipated DC Date:  03/11/2012   Anticipated DC Plan:  IP REHAB FACILITY  In-house referral  Clinical Social Worker      DC Associate Professor  CM consult            Status of service:  In process, will continue to follow Medicare Important Message given?  NA - LOS <3 / Initial given by admissions (If response is "NO", the following Medicare IM given date fields will be blank)  Per UR Regulation:  Reviewed for med. necessity/level of care/duration of stay  Comments:  03/08/2012- B Sagar Tengan RN, BSN, MHA

## 2012-03-08 NOTE — ED Provider Notes (Signed)
History     CSN: [id number]  Arrival date & time 03/08/12  4098   First MD Initiated Contact with Patient 03/08/12 0406      Chief Complaint  Patient presents with  . Seizures    (Consider location/radiation/quality/duration/timing/severity/associated sxs/prior treatment) Patient is a 51 y.o. male presenting with seizures. The history is provided by the patient.  Seizures    patient here after having witnessed seizures x2. Patient is currently in alcohol and cocaine rehabilitation. Last drink of alcohol was 2 days ago. He is not receiving any detox prophylaxis. Denies any fever or neck pain. He did like the right side of his tongue, no urinary incontinence noted. No prior history of seizure disorder. Denies any concurrent alcohol or drug use.  Past Medical History  Diagnosis Date  . Hypertension   . HIV (human immunodeficiency virus infection)     History reviewed. No pertinent past surgical history.  History reviewed. No pertinent family history.  History  Substance Use Topics  . Smoking status: Current Everyday Smoker -- 1.0 packs/day for 35 years    Types: Cigarettes  . Smokeless tobacco: Not on file  . Alcohol Use: 5.0 oz/week    40 drink(s) per week      Review of Systems  Neurological: Positive for seizures.  All other systems reviewed and are negative.    Allergies  Review of patient's allergies indicates no known allergies.  Home Medications   Current Outpatient Rx  Name Route Sig Dispense Refill  . BENAZEPRIL HCL 10 MG PO TABS Oral Take 1 tablet (10 mg total) by mouth daily. 30 tablet 11  . EFAVIRENZ-EMTRICITAB-TENOFOVIR 600-200-300 MG PO TABS Oral Take 1 tablet by mouth at bedtime. 30 tablet 6  . HYDROCHLOROTHIAZIDE 12.5 MG PO CAPS Oral Take 1 capsule (12.5 mg total) by mouth daily. 30 capsule 11  . ADULT MULTIVITAMIN W/MINERALS CH Oral Take 1 tablet by mouth daily.    Marland Kitchen SILDENAFIL CITRATE 100 MG PO TABS Oral Take 1 tablet (100 mg total) by mouth  daily as needed. 30 tablet 3    Expiration date 10/06/16  Lot # [id number]    BP 147/99  Temp 99.2 F (37.3 C) (Oral)  Resp 26  SpO2 99%  Physical Exam  Nursing note and vitals reviewed. Constitutional: He is oriented to person, place, and time. He appears well-developed and well-nourished.  Non-toxic appearance. No distress.  HENT:  Head: Normocephalic and atraumatic.  Eyes: Conjunctivae, EOM and lids are normal. Pupils are equal, round, and reactive to light.  Neck: Normal range of motion. Neck supple. No tracheal deviation present. No mass present.  Cardiovascular: Normal rate, regular rhythm and normal heart sounds.  Exam reveals no gallop.   No murmur heard. Pulmonary/Chest: Effort normal and breath sounds normal. No stridor. No respiratory distress. He has no decreased breath sounds. He has no wheezes. He has no rhonchi. He has no rales.  Abdominal: Soft. Normal appearance and bowel sounds are normal. He exhibits no distension. There is no tenderness. There is no rebound and no CVA tenderness.  Musculoskeletal: Normal range of motion. He exhibits no edema and no tenderness.  Neurological: He is alert and oriented to person, place, and time. He has normal strength. No cranial nerve deficit or sensory deficit. GCS eye subscore is 4. GCS verbal subscore is 5. GCS motor subscore is 6.  Skin: Skin is warm and dry. No abrasion and no rash noted.  Psychiatric: He has a normal mood and affect. His  speech is normal and behavior is normal.    ED Course  Procedures (including critical care time)   Labs Reviewed  CBC WITH DIFFERENTIAL  ETHANOL  URINE RAPID DRUG SCREEN (HOSP PERFORMED)  COMPREHENSIVE METABOLIC PANEL   No results found.   No diagnosis found.    MDM  Patient given Ativan 2 mg IV push ear. CT results noted. Will give patient IV dose of Keppra. Spoke with triad hospitalist, Dr. Lovell Sheehan, patient to be admitted        Toy Baker, MD 03/08/12 [id number]

## 2012-03-08 NOTE — ED Notes (Signed)
Per EMS report, pt from rehab center [facility]): Roommates were woken up shaking in bed. Witness 2 seizures with a brief break in between.  Found post-ictal, only responding to his name.  Denies hx of seizure. Roommates claims pt is detoxing after week long crack binge but pt denies claiming ETOH instead.  Blood noted around mouth. CBG 138, 18g L AC, HR 80. Now a/o x 4.

## 2012-03-08 NOTE — Progress Notes (Signed)
  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  Discussed findings  With neurology ( Dr Roseanne Reno) who recommends this is likely congenital and since patient has not had any seizure activity all his life this is more of an incidental findings and recommend to continue on keppra and follow EEG. . If symptoms of seizures continue to be an issues , then discussion with neurosurgery or IR may be an option in future.

## 2012-03-09 DIAGNOSIS — B20 Human immunodeficiency virus [HIV] disease: Secondary | ICD-10-CM

## 2012-03-09 DIAGNOSIS — R5081 Fever presenting with conditions classified elsewhere: Secondary | ICD-10-CM

## 2012-03-09 LAB — RPR: RPR Ser Ql: NONREACTIVE

## 2012-03-09 MED ORDER — SODIUM CHLORIDE 0.9 % IV SOLN
INTRAVENOUS | Status: DC
  Administered 2012-03-09 – 2012-03-10 (×3): via INTRAVENOUS

## 2012-03-09 NOTE — Progress Notes (Addendum)
Subjective: Patient seen and examine this morning. complains of some frontal  headache  Objective:  Vital signs in last 24 hours:  Filed Vitals:   03/08/12 1354 03/09/12 0001 03/09/12 0300 03/09/12 0700  BP: 151/88 141/84 138/89 130/85  Pulse: 74 69 71 70  Temp: 98.1 F (36.7 C) 98.2 F (36.8 C)  98.5 F (36.9 C)  TempSrc: Oral Oral  Oral  Resp: 20 20  16   Height:      Weight:      SpO2: 98% 98%  99%    Intake/Output from previous day:   Intake/Output Summary (Last 24 hours) at 03/09/12 0900 Last data filed at 03/09/12 0238  Gross per 24 hour  Intake    480 ml  Output   2175 ml  Net  -1695 ml    Physical Exam: General : middle aged male in NAD Head: Normocephalic and atraumatic. Temporal wasting.  Ear: TM normal bilaterally  Mouth: no erythema or exudates, MMM, laceration over right side of tongue.  Eyes: PERRL, EOMI, conjunctivae normal, No scleral icterus.  Neck: Supple, Trachea midline normal ROM, No JVD, mass, thyromegaly, or carotid bruit present. scar over left neck laceration.  Cardiovascular: RRR, S1 normal, S2 normal, no MRG, pulses symmetric and intact bilaterally  Pulmonary/Chest: CTAB, no wheezes, rales, or rhonchi  Abdominal: Soft. Non-tender, non-distended, bowel sounds are normal, no masses, organomegaly, or guarding present.  GU: no CVA tenderness Musculoskeletal: No joint deformities, erythema, or stiffness, ROM full and no nontender Ext: no edema and no cyanosis, pulses palpable bilaterally (DP and PT)  Hematology: no cervical, inginal, or axillary adenopathy.  Neurological: A&O x3, Strenght is normal and symmetric bilaterally, cranial nerve II-XII are grossly intact, no focal motor deficit, sensory intact to light touch bilaterally. no neck rigidity.  Skin: Warm, dry and intact. No rash, cyanosis, or clubbing.    Lab Results:  Basic Metabolic Panel:    Component Value Date/Time   NA 135 03/08/2012 0430   K 3.8 03/08/2012 0430   CL 100 03/08/2012  0430   CO2 26 03/08/2012 0430   BUN 18 03/08/2012 0430   CREATININE 0.84 03/08/2012 0430   CREATININE 1.08 01/17/2012 1147   GLUCOSE 97 03/08/2012 0430   CALCIUM 8.9 03/08/2012 0430   CBC:    Component Value Date/Time   WBC 3.6* 03/08/2012 0430   HGB 13.1 03/08/2012 0430   HCT 39.5 03/08/2012 0430   PLT 289 03/08/2012 0430   MCV 86.4 03/08/2012 0430   NEUTROABS 1.8 03/08/2012 0430   LYMPHSABS 1.4 03/08/2012 0430   MONOABS 0.3 03/08/2012 0430   EOSABS 0.0 03/08/2012 0430   BASOSABS 0.0 03/08/2012 0430    No results found for this or any previous visit (from the past 240 hour(s)).  Studies/Results: Dg Chest 2 View  03/08/2012  *RADIOLOGY REPORT*  Clinical Data: Fever.  Rule out pneumonia.  Aspiration.  CHEST - 2 VIEW  Comparison: 10/14/2009  Findings: The heart size and mediastinal contours are within normal limits.  Both lungs are clear.  The visualized skeletal structures are unremarkable.  IMPRESSION: Negative exam.  Original Report Authenticated By: Rosealee Albee, M.D.   Ct Head Wo Contrast  03/08/2012  *RADIOLOGY REPORT*  Clinical Data: Hypertension, seizure.  CT HEAD WITHOUT CONTRAST  Technique:  Contiguous axial images were obtained from the base of the skull through the vertex without contrast.  Comparison: None.  Findings: Serpentine vessels in communication with a 2.9 cm hyperdense mass centered in the inferior anterior left  frontal lobe.  The attenuation of the mass is isointense to the feeding/draining vessels. No internal calcifications. There may be minimal surrounding hypoattenuation however no mass effect, midline shift, or definite hemorrhage.  No hydrocephalous or intraventricular hemorrhage.  No abnormal extra-axial fluid collection. The visualized paranasal sinuses and mastoid air cells are predominately clear.  IMPRESSION: Serpentine vessels in communication with a 2.9 cm hyperdense left frontal lobe mass, favored to represent an arteriovenous malformation. Recommend contrast enhanced MRI.   Discussed via telephone with Dr. Freida Busman at 04:58 on 03/08/2012.  Original Report Authenticated By: Waneta Martins, M.D.   [name] JW Contrast  03/08/2012  *RADIOLOGY REPORT*  Clinical Data: Witnessed seizures.  Possible avium over the left lobe.  Abnormal CT scan.  MRI HEAD WITHOUT AND WITH CONTRAST  Technique:  Multiplanar, multiecho pulse sequences of the brain and surrounding structures were obtained according to standard protocol without and with intravenous contrast  Contrast: 14mL MULTIHANCE GADOBENATE DIMEGLUMINE 529 MG/ML IV SOLN  Comparison: CT head without contrast 03/08/01/2013.  Findings: An arterial venous malformation is centered in the medial anterior left frontal lobe.  There are some vessels which cross the falx.  The lesion is presumedly fed from the anterior cerebral artery on the left.  The dominant draining vein is a large vein which extends posteriorly within the subarachnoid space and drains to the vein of Galen.  The draining vein sits adjacent to the hippocampus.  There is no definite abnormal signal within the left hippocampus or medial temporal lobe.  There is an additional draining vein anteriorly which extends to the anterior aspect of the superior sagittal sinus.  A prominent central nidus is evident at the origin of the large posterior draining vein.  The primary component of the AVM measures 3.7 x 2.6 x 3.1 cm.  There is minimal FLAIR hyperintensity surrounding the AVM.  No acute infarct is present.  There is no evidence for hemorrhage.  The ventricles are of normal size.  No significant extra-axial fluid collection is present.  There is no other significant white matter disease for age.  Flow is present in the major intracranial arteries.  The globes orbits are intact.  Mild mucosal thickening is present in the left maxillary sinus.  The mastoid air cells are clear bilaterally.  IMPRESSION:  1.  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  This is a Spetler-Martin grade 3 AVM, receiving 2 points for size and 1 point for deep drainage. 2.  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 3.  The dominant posterior draining vein courses just above the left hippocampus.  Although there is no abnormal signal in the left hippocampus, this could be related to the patient's seizures.  These results were called by telephone on 03/08/2012  at  12:20 p.m. to  Dr. Gonzella Lex, who verbally acknowledged these results.  Original Report Authenticated By: Jamesetta Orleans. MATTERN, M.D.    Medications: Scheduled Meds:   . sodium chloride   Intravenous STAT  . benazepril  10 mg Oral Daily  . efavirenz-emtricitabine-tenofovir  1 tablet Oral QHS  . enoxaparin  40 mg Subcutaneous Q24H  . folic acid  1 mg Oral Daily  . hydrochlorothiazide  12.5 mg Oral Daily  . levETIRAcetam  500 mg Oral BID  . LORazepam  0-4 mg Intravenous Q6H   Followed by  . LORazepam  0-4 mg Intravenous Q12H  . multivitamin with minerals  1 tablet Oral Daily  . nicotine  21 mg Transdermal Daily  . general admission iv infusion   Intravenous Once  . sodium chloride  3 mL Intravenous Q12H  . thiamine  100 mg Oral Daily   Or  . thiamine  100 mg Intravenous Daily   Continuous Infusions:  PRN Meds:.acetaminophen, acetaminophen, gadobenate dimeglumine, LORazepam, LORazepam, LORazepam  Assessment/Plan:  Acute Seizures  Possibly related to etoh withdrawal  -stable on telemetry  - neurochecks  -seizure precautions  -IV ativan q4h prn for seizure activity  -CIWA protocol  -elevated cpk ( 633). Mg and k wnl -discussed with neurology over the phone and recommended starting keppra 500 mg bid and agree with getting MRI brain given possible AVM over left side of brain on head CT.  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 . If symptoms of seizures continue to be an issues , then discussion with neurosurgery or IR may be an option in future.  -EEG preliminary report is abnormal to suggest seizure activity. Will follow final report -patient advised not to  drive for at least 6 months and needs to follow up with neurology as outpt on discharge.  Fever   likely secondary to seizures  UA,  blood cx and CXR negative . No clincial signs of meningitis  Continue ART.  Seen by Dr Ninetta Lights and agrees fever is likely related to seizures. No further fever since admission.   HIV  Follows with Dr Maurice March,. Last CD4 of 320. On ART which will be continued.  Seen by Dr Ninetta Lights this admission. Repeat CD4 and viral load sent  Hepatitis C  Not treated due to active substance abuse   Hypertension  Continue home meds   Alcohol abuse   No signs of intoxication or withdrawal currently  monitor for any withdrawal or DTs  Continue CIWA protocol  Counseled on etoh cessation.   Cocaine abuse  Counseled on stopping   Tobacco use disorder  Order nicotine patch  counseled on quitting smoking   DVT prophylaxis  sq lovenox  transfer out of tele. If stabel overnight can be discharged home tomorrow with outpt follow up    LOS: 1 day   Isaac [name] 03/09/2012, 9:00 AM

## 2012-03-09 NOTE — Procedures (Signed)
EEG NUMBER:  [id number].  This routine EEG was requested in this 51 year old who was admitted with seizures, history of HIV, hypertension, and substance abuse.  Medications include Ativan.  The EEG was done with the patient awake, drowsy and asleep.  During periods of maximal wakefulness, he has a 9 second posterior dominant rhythm that attenuated with eye opening and was symmetric.  Background activities are composed of low amplitude beta activities that were symmetric as well as frontally dominant.  Photic stimulation did not produce a driving response.  The patient was not hyperventilated.  The patient become sleepy as characterized by an attenuation of the alpha rhythm and muscle activities.  There is an onset of symmetric slower activities.  Symmetric vertex sharp waves were noted.  CLINICAL INTERPRETATION:  This routine EEG done with the patient awake and drowsy is normal.          ______________________________ Denton Meek, MD    [id number] D:  03/09/2012 07:05:08  T:  03/09/2012 07:20:44  Job #:  [id number]

## 2012-03-10 DIAGNOSIS — Q282 Arteriovenous malformation of cerebral vessels: Secondary | ICD-10-CM

## 2012-03-10 DIAGNOSIS — Q283 Other malformations of cerebral vessels: Secondary | ICD-10-CM

## 2012-03-10 LAB — T-HELPER CELLS (CD4) COUNT (NOT AT ARMC): CD4 T Cell Abs: 570 uL (ref 400–2700)

## 2012-03-10 MED ORDER — THIAMINE HCL 100 MG PO TABS: 100.0000 mg | ORAL_TABLET | Freq: Every day | ORAL | Status: DC

## 2012-03-10 MED ORDER — LEVETIRACETAM 500 MG PO TABS: 500.0000 mg | ORAL_TABLET | Freq: Two times a day (BID) | ORAL | Status: DC

## 2012-03-10 MED ORDER — FOLIC ACID 1 MG PO TABS: 1.0000 mg | ORAL_TABLET | Freq: Every day | ORAL | Status: DC

## 2012-03-10 NOTE — Progress Notes (Signed)
Patient ID: Isaac [name], male   DOB: [birth year], 51 y.o.   MRN: [id number]    [facility]    Date of Admission:  03/08/2012     Principal Problem:  *Seizure Active Problems:  HIV DISEASE  HEPATITIS C  HYPERTENSION  Alcohol abuse  Cocaine abuse  Tobacco use disorder      . benazepril  10 mg Oral Daily  . efavirenz-emtricitabine-tenofovir  1 tablet Oral QHS  . enoxaparin  40 mg Subcutaneous Q24H  . folic acid  1 mg Oral Daily  . hydrochlorothiazide  12.5 mg Oral Daily  . levETIRAcetam  500 mg Oral BID  . LORazepam  0-4 mg Intravenous Q6H   Followed by  . LORazepam  0-4 mg Intravenous Q12H  . multivitamin with minerals  1 tablet Oral Daily  . nicotine  21 mg Transdermal Daily  . sodium chloride  3 mL Intravenous Q12H  . thiamine  100 mg Oral Daily   Or  . thiamine  100 mg Intravenous Daily    Subjective: He is feeling better. He has not had any further seizures. He denies missing any doses of his Atripla recently. He has been homeless recently and drinking alcohol more heavily. He believes that's why he has lost some weight. He is currently at [facility] house and helping to get accepted at the [facility].  Objective: Temp:  [98.1 F (36.7 C)-98.7 F (37.1 C)] 98.1 F (36.7 C) (07/05 0559) Pulse Rate:  [58-82] 58  (07/05 0559) Resp:  [16-18] 16  (07/05 0559) BP: (117-133)/(75-88) 133/83 mmHg (07/05 0559) SpO2:  [98 %-100 %] 99 % (07/05 0559)  General:  alert and comfortable Skin:  no rash Lungs:  clear Cor: Regular S1 and S2 no murmurs Abdomen:  nontender  Lab Results HIV 1 RNA Quant (copies/mL)  Date Value  01/17/2012 <20   08/26/2011 <20   03/25/2011 <20      CD4 T Cell Abs (cmm)  Date Value  01/17/2012 320*  08/26/2011 380*  03/25/2011 720       Microbiology: Recent Results (from the past 240 hour(s))  CULTURE, BLOOD (ROUTINE X 2)     Status: Normal (Preliminary result)   Collection Time   03/08/12  9:15 AM      Component  Value Range Status Comment   Specimen Description BLOOD RIGHT ARM   Final    Special Requests BOTTLES DRAWN AEROBIC AND ANAEROBIC 10CC   Final    Culture  Setup Time 03/08/2012 13:16   Final    Culture     Final    Value:        BLOOD CULTURE RECEIVED NO GROWTH TO DATE CULTURE WILL BE HELD FOR 5 DAYS BEFORE ISSUING A FINAL NEGATIVE REPORT   Report Status PENDING   Incomplete   CULTURE, BLOOD (ROUTINE X 2)     Status: Normal (Preliminary result)   Collection Time   03/08/12  9:20 AM      Component Value Range Status Comment   Specimen Description BLOOD RIGHT HAND   Final    Special Requests BOTTLES DRAWN AEROBIC AND ANAEROBIC 10CC   Final    Culture  Setup Time 03/08/2012 13:16   Final    Culture     Final    Value:        BLOOD CULTURE RECEIVED NO GROWTH TO DATE CULTURE WILL BE HELD FOR 5 DAYS BEFORE ISSUING A FINAL NEGATIVE REPORT   Report Status PENDING  Incomplete     Studies/Results: Dg Chest 2 View  03/08/2012  *RADIOLOGY REPORT*  Clinical Data: Fever.  Rule out pneumonia.  Aspiration.  CHEST - 2 VIEW  Comparison: 10/14/2009  Findings: The heart size and mediastinal contours are within normal limits.  Both lungs are clear.  The visualized skeletal structures are unremarkable.  IMPRESSION: Negative exam.  Original Report Authenticated By: Rosealee Albee, M.D.   [name] RU Contrast  03/08/2012  *RADIOLOGY REPORT*  Clinical Data: Witnessed seizures.  Possible avium over the left lobe.  Abnormal CT scan.  MRI HEAD WITHOUT AND WITH CONTRAST  Technique:  Multiplanar, multiecho pulse sequences of the brain and surrounding structures were obtained according to standard protocol without and with intravenous contrast  Contrast: 14mL MULTIHANCE GADOBENATE DIMEGLUMINE 529 MG/ML IV SOLN  Comparison: CT head without contrast 03/08/01/2013.  Findings: An arterial venous malformation is centered in the medial anterior left frontal lobe.  There are some vessels which cross the falx.  The lesion is  presumedly fed from the anterior cerebral artery on the left.  The dominant draining vein is a large vein which extends posteriorly within the subarachnoid space and drains to the vein of Galen.  The draining vein sits adjacent to the hippocampus.  There is no definite abnormal signal within the left hippocampus or medial temporal lobe.  There is an additional draining vein anteriorly which extends to the anterior aspect of the superior sagittal sinus.  A prominent central nidus is evident at the origin of the large posterior draining vein.  The primary component of the AVM measures 3.7 x 2.6 x 3.1 cm.  There is minimal FLAIR hyperintensity surrounding the AVM.  No acute infarct is present.  There is no evidence for hemorrhage.  The ventricles are of normal size.  No significant extra-axial fluid collection is present.  There is no other significant white matter disease for age.  Flow is present in the major intracranial arteries.  The globes orbits are intact.  Mild mucosal thickening is present in the left maxillary sinus.  The mastoid air cells are clear bilaterally.  IMPRESSION:  1.  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  This is a Spetler-Martin grade 3 AVM, receiving 2 points for size and 1 point for deep drainage. 2.  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 3.  The dominant posterior draining vein courses just above the left hippocampus.  Although there is no abnormal signal in the left hippocampus, this could be related to the patient's seizures.  These results were called by telephone on 03/08/2012  at  12:20 p.m. to  Dr. Gonzella Lex, who verbally acknowledged these results.  Original Report Authenticated By: Jamesetta Orleans. MATTERN, M.D.    Assessment: He has had no further fever since admission and has no evidence of active infection. His HIV infection remains  under very good control.  Plan: 1.  Continue Atripla 2.  He plans on keeping his followup appointment in our clinic with Dr. Maurice March this coming August 3. Please call if we can be of further assistance while here  Cliffton Asters, MD [facility] [phone] pager   [phone] cell 03/10/2012, 8:55 AM

## 2012-03-10 NOTE — Progress Notes (Signed)
Pt left at 1800 this evening. Pt alert, oriented, and without c/o. No s/s of acute distress noted. Discharge instructions given/explained with pt verbalizing understanding. Prescriptions given for pt to fill. Pt stating that a person from "[facility] was picking him up at front of hospital.
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
## 2012-03-13 LAB — HIV-1 RNA QUANT-NO REFLEX-BLD: HIV-1 RNA Quant, Log: 1.92 {Log} — ABNORMAL HIGH (ref <1.30)

## 2012-03-14 LAB — CULTURE, BLOOD (ROUTINE X 2): Culture: NO GROWTH

## 2012-04-14 ENCOUNTER — Other Ambulatory Visit: Payer: Self-pay

## 2012-04-18 ENCOUNTER — Encounter (HOSPITAL_COMMUNITY): Payer: Self-pay | Admitting: *Deleted

## 2012-04-18 ENCOUNTER — Emergency Department (INDEPENDENT_AMBULATORY_CARE_PROVIDER_SITE_OTHER)
Admission: EM | Admit: 2012-04-18 | Discharge: 2012-04-18 | Disposition: A | Discharge status: Home / Self Care | Payer: Medicaid Other | Source: Home / Self Care | Attending: Emergency Medicine | Admitting: Emergency Medicine

## 2012-04-18 DIAGNOSIS — H1130 Conjunctival hemorrhage, unspecified eye: Secondary | ICD-10-CM

## 2012-04-18 HISTORY — DX: Unspecified convulsions: R56.9

## 2012-04-18 MED ORDER — TETRACAINE HCL 0.5 % OP SOLN
OPHTHALMIC | Status: AC
  Filled 2012-04-18: qty 2

## 2012-04-18 MED ORDER — POLYETHYL GLYCOL-PROPYL GLYCOL 0.4-0.3 % OP SOLN: OPHTHALMIC | Status: DC

## 2012-04-18 NOTE — ED Provider Notes (Signed)
Chief Complaint  Patient presents with  . Eye Pain    History of Present Illness:   Isaac [name] is a 51 year old HIV-positive male. He's had slight left eye irritation and discomfort for the past 2 weeks. Over the past 3 or 4 days he's noted redness in the left eye. He denies any trauma or foreign body. His vision has been normal. He denies any drainage from the eye and. He's had no diplopia.  Review of Systems:  Other than noted above, the patient denies any of the following symptoms: Systemic:  No fever, chills, sweats, fatigue, or weight loss. Eye:  No redness, eye pain, photophobia, discharge, blurred vision, or diplopia. ENT:  No nasal congestion, rhinorrhea, or sore throat. Lymphatic:  No adenopathy. Skin:  No rash or pruritis.  PMFSH:  Past medical history, family history, social history, meds, and allergies were reviewed.  Physical Exam:   Vital signs:  BP 151/78  Pulse 76  Temp 98.2 F (36.8 C) (Oral)  Resp 18  SpO2 98% General:  Alert and in no distress. Eye:  The lids and periorbital tissues are normal. There was no foreign body in the conjunctival sac. The upper lid was everted. He has a some conjunctival hematoma involving the medial canthus of the left eye. The conjunctiva was otherwise normal. There was no discharge. Cornea was intact to fluorescein staining, anterior chamber was normal. Fundi were not well seen bilaterally. PERRLA, full EOMs. Intraocular pressure using the Tono-Pen was 15 on the right with 5% accuracy and 10 on the left with 10% accuracy. ENT:  TMs and canals clear.  Nasal mucosa normal.  No intra-oral lesions, mucous membranes moist, pharynx clear. Neck:  No adenopathy tenderness or mass. Skin:  Clear, warm and dry.    Assessment:  The encounter diagnosis was Subconjunctival hematoma.  Plan:   1.  The following meds were prescribed:   New Prescriptions   POLYETHYL GLYCOL-PROPYL GLYCOL (SYSTANE) 0.4-0.3 % SOLN    1 drop in left eye every 3 hours while  awake.   2.  The patient was instructed in symptomatic care and handouts were given. 3.  The patient was told to return if becoming worse in any way, if no better in 3 or 4 days, and given some red flag symptoms that would indicate earlier return.     Reuben Likes, MD 04/18/12 2128

## 2012-04-18 NOTE — ED Notes (Signed)
Pt reports pain in left eye for the past 2 weeks, 2 days ago he reports redness to eye and feeling like there was a rock moving around in his eye. Reports blurred vision out of left eye.

## 2012-04-28 ENCOUNTER — Encounter: Payer: Self-pay | Admitting: Infectious Diseases

## 2012-04-28 ENCOUNTER — Ambulatory Visit (INDEPENDENT_AMBULATORY_CARE_PROVIDER_SITE_OTHER): Payer: Self-pay | Admitting: Infectious Diseases

## 2012-04-28 VITALS — BP 148/89 | HR 88 | Temp 98.3°F | Wt 176.0 lb

## 2012-04-28 DIAGNOSIS — B20 Human immunodeficiency virus [HIV] disease: Secondary | ICD-10-CM

## 2012-04-28 NOTE — Progress Notes (Signed)
Patient ID: Isaac [name], male   DOB: [birth year], 51 y.o.   MRN: [id number] HIV f/u     [name] is HIV suppressed and adherent to his Atripla. His BP is controlled. Last month he had grand mal seizure and was placed on Keppra in ED which he is currently taking and has had no more siezures. Several days before seizure he had stopped using cocaine and alcohol. He never had seizures before, however, when he had stopped drugs and alcohol but admits that he had been using heavily in the past 6 months. He is now back in counseling which I reinforced today. His HIV is suppressed and his CD4 count is 540 which was drawn in ED last month. Exam BP 148/89  Pulse 88  Temp 98.3 F (36.8 C) (Oral)  Wt 176 lb (79.833 kg) NAD and oriented. No tremors. Normal gait. Impression and Plan HIV controlled and continue Atripla HTN continue meds Seizure new problem and probably drug withdrawal based on normal brain CT/MRI and history of change in drug intake at the time. He will d/c keppra in another month and if seizure free for subsequent 6 months then he can drive. F/u in 4-5 months Lina Sayre

## 2012-05-03 ENCOUNTER — Ambulatory Visit: Payer: Self-pay

## 2012-05-03 ENCOUNTER — Telehealth: Payer: Self-pay | Admitting: Internal Medicine

## 2012-05-03 NOTE — Telephone Encounter (Signed)
Called [facility] and ordered Isaac [name] Viagra today.

## 2012-05-09 ENCOUNTER — Telehealth: Payer: Self-pay | Admitting: *Deleted

## 2012-05-09 NOTE — Telephone Encounter (Signed)
Called patient and notified that his PA medication for Viagra has arrived.  Lot # [id number], Exp. 12/04/16. Wendall Mola CMA

## 2012-07-14 ENCOUNTER — Other Ambulatory Visit: Payer: Self-pay | Admitting: Infectious Diseases

## 2012-07-31 ENCOUNTER — Ambulatory Visit (INDEPENDENT_AMBULATORY_CARE_PROVIDER_SITE_OTHER): Payer: Self-pay

## 2012-07-31 DIAGNOSIS — Z23 Encounter for immunization: Secondary | ICD-10-CM

## 2012-08-12 ENCOUNTER — Other Ambulatory Visit: Payer: Self-pay | Admitting: Infectious Diseases

## 2012-08-14 ENCOUNTER — Other Ambulatory Visit: Payer: Self-pay | Admitting: *Deleted

## 2012-08-14 DIAGNOSIS — N529 Male erectile dysfunction, unspecified: Secondary | ICD-10-CM

## 2012-08-14 MED ORDER — SILDENAFIL CITRATE 100 MG PO TABS: 100.0000 mg | ORAL_TABLET | Freq: Every day | ORAL | Status: DC | PRN

## 2012-08-14 NOTE — Telephone Encounter (Signed)
Rx printed to send with patient PAP application.

## 2012-08-21 ENCOUNTER — Telehealth: Payer: Self-pay | Admitting: Infectious Diseases

## 2012-08-21 NOTE — Telephone Encounter (Signed)
Faxed Mr. Isaac [name] application for Viagra to [facility] today.

## 2012-08-23 ENCOUNTER — Telehealth: Payer: Self-pay | Admitting: Infectious Diseases

## 2012-08-23 NOTE — Telephone Encounter (Signed)
Called [facility] to check status on Isaac [name] application for Viagra.  It has been approved and should ship with the next 3 - 5 business days.  His application has been approved until 08-18-13.

## 2012-09-14 ENCOUNTER — Other Ambulatory Visit: Payer: Self-pay | Admitting: *Deleted

## 2012-09-14 ENCOUNTER — Other Ambulatory Visit: Payer: Self-pay

## 2012-09-14 DIAGNOSIS — Z21 Asymptomatic human immunodeficiency virus [HIV] infection status: Secondary | ICD-10-CM

## 2012-09-14 DIAGNOSIS — B20 Human immunodeficiency virus [HIV] disease: Secondary | ICD-10-CM

## 2012-09-14 MED ORDER — EFAVIRENZ-EMTRICITAB-TENOFOVIR 600-200-300 MG PO TABS: 1.0000 | ORAL_TABLET | Freq: Every day | ORAL | Status: DC

## 2012-09-18 ENCOUNTER — Other Ambulatory Visit: Payer: Self-pay | Admitting: Infectious Diseases

## 2012-09-29 ENCOUNTER — Ambulatory Visit: Payer: Self-pay | Admitting: Infectious Diseases

## 2012-10-12 ENCOUNTER — Other Ambulatory Visit (INDEPENDENT_AMBULATORY_CARE_PROVIDER_SITE_OTHER): Payer: No Typology Code available for payment source

## 2012-10-12 DIAGNOSIS — B20 Human immunodeficiency virus [HIV] disease: Secondary | ICD-10-CM

## 2012-10-12 LAB — CBC WITH DIFFERENTIAL/PLATELET
Basophils Absolute: 0 10*3/uL (ref 0.0–0.1)
Basophils Relative: 1 % (ref 0–1)
Eosinophils Absolute: 0.1 10*3/uL (ref 0.0–0.7)
Eosinophils Relative: 1 % (ref 0–5)
HCT: 40.5 % (ref 39.0–52.0)
Lymphocytes Relative: 43 % (ref 12–46)
MCHC: 32.8 g/dL (ref 30.0–36.0)
MCV: 84 fL (ref 78.0–100.0)
Monocytes Absolute: 0.4 10*3/uL (ref 0.1–1.0)
Platelets: 326 10*3/uL (ref 150–400)
RDW: 13 % (ref 11.5–15.5)
WBC: 4.5 10*3/uL (ref 4.0–10.5)

## 2012-10-12 LAB — COMPREHENSIVE METABOLIC PANEL
Alkaline Phosphatase: 72 U/L (ref 39–117)
CO2: 27 mEq/L (ref 19–32)
Creat: 0.96 mg/dL (ref 0.50–1.35)
Glucose, Bld: 81 mg/dL (ref 70–99)
Sodium: 137 mEq/L (ref 135–145)
Total Bilirubin: 0.5 mg/dL (ref 0.3–1.2)

## 2012-10-13 LAB — HIV-1 RNA QUANT-NO REFLEX-BLD
HIV 1 RNA Quant: 42 copies/mL — ABNORMAL HIGH (ref <20)
HIV-1 RNA Quant, Log: 1.62 {Log} — ABNORMAL HIGH (ref <1.30)

## 2012-10-13 LAB — T-HELPER CELL (CD4) - (RCID CLINIC ONLY): CD4 % Helper T Cell: 24 % — ABNORMAL LOW (ref 33–55)

## 2012-10-16 ENCOUNTER — Other Ambulatory Visit: Payer: Self-pay | Admitting: *Deleted

## 2012-10-16 DIAGNOSIS — I10 Essential (primary) hypertension: Secondary | ICD-10-CM

## 2012-10-16 MED ORDER — BENAZEPRIL HCL 10 MG PO TABS: 10.0000 mg | ORAL_TABLET | Freq: Every day | ORAL | Status: DC

## 2012-10-16 MED ORDER — HYDROCHLOROTHIAZIDE 12.5 MG PO CAPS: 12.5000 mg | ORAL_CAPSULE | Freq: Every day | ORAL | Status: DC

## 2012-10-27 ENCOUNTER — Encounter: Payer: Self-pay | Admitting: Infectious Diseases

## 2012-10-27 ENCOUNTER — Ambulatory Visit: Payer: Self-pay | Admitting: Infectious Diseases

## 2012-10-27 ENCOUNTER — Ambulatory Visit (INDEPENDENT_AMBULATORY_CARE_PROVIDER_SITE_OTHER): Payer: No Typology Code available for payment source | Admitting: Infectious Diseases

## 2012-10-27 VITALS — BP 140/100 | HR 88 | Temp 98.2°F | Ht 69.0 in | Wt 187.0 lb

## 2012-10-27 DIAGNOSIS — B20 Human immunodeficiency virus [HIV] disease: Secondary | ICD-10-CM

## 2012-10-27 NOTE — Progress Notes (Signed)
Patient ID: Isaac [name], male   DOB: November 17, 1960, 52 y.o.   MRN: [id number] HIV f/u not     [name] has no complaints other than sore molar that has been giving him pain with temp sensitivity. He has had no fevers and has been adherent to his HIV meds but not his anti BP meds. He says that cost is issue even though he is on an $4 dollar/month regimen. Encouraged him to full BP meds.      He has had no fever or weight loss. Exam: BP 140/100  Pulse 88  Temp(Src) 98.2 F (36.8 C) (Oral)  Ht 5\' 9"  (1.753 m)  Wt 187 lb (84.823 kg)  BMI 27.6 kg/m2    Oral exam shows no swelling or obvious abscess but he does have several caries in molars. Impression and f/u HIV stable and continue Atripla BP needs to refill meds and says he will Dental caries with dental referral today. Followup for HIV in 4-5 months Lina Sayre

## 2012-11-02 NOTE — Addendum Note (Signed)
Addended by: Andree Coss on: 11/02/2012 08:58 AM   Modules accepted: Orders

## 2012-11-21 ENCOUNTER — Telehealth: Payer: Self-pay | Admitting: *Deleted

## 2012-11-21 ENCOUNTER — Telehealth: Payer: Self-pay | Admitting: Infectious Diseases

## 2012-11-21 NOTE — Telephone Encounter (Signed)
Patient called requesting a refill of his Viagra through [facility]. Patient concerned because there was "a mix up" last time. Will forward the request to her. Andree Coss, RN

## 2012-11-21 NOTE — Telephone Encounter (Signed)
Re-ordered Viagra today.  Should take 7-10 days to receive.

## 2012-11-27 ENCOUNTER — Telehealth: Payer: Self-pay | Admitting: *Deleted

## 2012-11-27 NOTE — Telephone Encounter (Signed)
Called patient to notify him that his viagra refill arrived.  He will come to the office to pick them up. Lot: [id number], Exp: 07/06/2017, NDC: [id number]

## 2013-01-18 ENCOUNTER — Other Ambulatory Visit: Payer: Self-pay | Admitting: Infectious Diseases

## 2013-02-09 ENCOUNTER — Other Ambulatory Visit: Payer: Self-pay | Admitting: *Deleted

## 2013-02-09 ENCOUNTER — Other Ambulatory Visit: Payer: Self-pay

## 2013-02-09 DIAGNOSIS — N529 Male erectile dysfunction, unspecified: Secondary | ICD-10-CM

## 2013-02-09 DIAGNOSIS — B20 Human immunodeficiency virus [HIV] disease: Secondary | ICD-10-CM

## 2013-02-09 LAB — COMPLETE METABOLIC PANEL WITH GFR
ALT: 75 U/L — ABNORMAL HIGH (ref 0–53)
AST: 64 U/L — ABNORMAL HIGH (ref 0–37)
Albumin: 4.2 g/dL (ref 3.5–5.2)
Alkaline Phosphatase: 65 U/L (ref 39–117)
Calcium: 8.9 mg/dL (ref 8.4–10.5)
Chloride: 106 mEq/L (ref 96–112)
Potassium: 4.1 mEq/L (ref 3.5–5.3)
Sodium: 139 mEq/L (ref 135–145)

## 2013-02-09 LAB — CBC WITH DIFFERENTIAL/PLATELET
Basophils Absolute: 0 10*3/uL (ref 0.0–0.1)
Basophils Relative: 1 % (ref 0–1)
HCT: 38.8 % — ABNORMAL LOW (ref 39.0–52.0)
Lymphocytes Relative: 51 % — ABNORMAL HIGH (ref 12–46)
Monocytes Absolute: 0.4 10*3/uL (ref 0.1–1.0)
Neutro Abs: 1.6 10*3/uL — ABNORMAL LOW (ref 1.7–7.7)
Neutrophils Relative %: 37 % — ABNORMAL LOW (ref 43–77)
Platelets: 282 10*3/uL (ref 150–400)
RDW: 13.3 % (ref 11.5–15.5)
WBC: 4.3 10*3/uL (ref 4.0–10.5)

## 2013-02-11 LAB — HIV-1 RNA QUANT-NO REFLEX-BLD
HIV 1 RNA Quant: <20 copies/mL (ref <20)
HIV-1 RNA Quant, Log: <1.3 {Log} (ref <1.30)

## 2013-02-12 ENCOUNTER — Telehealth: Payer: Self-pay | Admitting: *Deleted

## 2013-02-12 NOTE — Telephone Encounter (Signed)
Called [facility] and reordered Viagra for Isaac [name].  It should arrive within 7-10 business days.

## 2013-02-15 ENCOUNTER — Telehealth: Payer: Self-pay | Admitting: Licensed Clinical Social Worker

## 2013-02-15 ENCOUNTER — Other Ambulatory Visit: Payer: Self-pay | Admitting: Licensed Clinical Social Worker

## 2013-02-15 DIAGNOSIS — B20 Human immunodeficiency virus [HIV] disease: Secondary | ICD-10-CM

## 2013-02-15 MED ORDER — EFAVIRENZ-EMTRICITAB-TENOFOVIR 600-200-300 MG PO TABS: ORAL_TABLET | ORAL | Status: DC

## 2013-02-15 NOTE — Telephone Encounter (Signed)
Patient's medication arrived today from [facility]. I tried to notify the patient and phone number was unavailable.

## 2013-02-23 ENCOUNTER — Ambulatory Visit (INDEPENDENT_AMBULATORY_CARE_PROVIDER_SITE_OTHER): Payer: Self-pay | Admitting: Infectious Diseases

## 2013-02-23 ENCOUNTER — Encounter: Payer: Self-pay | Admitting: Infectious Diseases

## 2013-02-23 VITALS — BP 144/95 | HR 84 | Temp 98.2°F | Wt 185.0 lb

## 2013-02-23 DIAGNOSIS — Z79899 Other long term (current) drug therapy: Secondary | ICD-10-CM

## 2013-02-23 DIAGNOSIS — B20 Human immunodeficiency virus [HIV] disease: Secondary | ICD-10-CM

## 2013-02-23 DIAGNOSIS — Z113 Encounter for screening for infections with a predominantly sexual mode of transmission: Secondary | ICD-10-CM

## 2013-02-23 NOTE — Progress Notes (Signed)
Patient ID: Isaac [name], male   DOB: 1961-06-22, 52 y.o.   MRN: [id number] HIV f/u     Isaac [name] has no complaints since last visit and has been mostly compliant with his meds including his benazepril and HCTZ. His BP is 145/95 today and asked him to bring in home BP readings on twice per week schedule to see if he needs different antiHTN regimen.      He has been pretty compliant with his ARV regimen and has had no fevers or weight loss. He has not replapsed in over a year and a half with cocaine and feels more in control. 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 Reflecting this is his CD4 of 510 and undectable HIV assay.      We discussed his HCV today and while we await a fair economic solution to the new highly effectivre newer HCV drugs with RW then will not at present refer to hepatology group. Indeed I feel that eventually in the next year or so HCV will be treated by ID doctors without any further referrals.  BP 144/95  Pulse 84  Temp(Src) 98.2 F (36.8 C) (Oral)  Wt 185 lb (83.915 kg)  BMI 27.31 kg/m2 Isaac [name] is well developed and very healthy in appearance.  He is schduled for dental work for his caries. No peripheral adenopahy. Live is not palpable. I&P 1.Continue Atripla 2.HTN if home measurements show BP elevation over next several months then will need a vasodilator med added. 3.HCV await payment measures for antivral access. Isaac [name] will have his care under my partner, Dr. Enedina Finner upon my retiremtent. Lina Sayre

## 2013-03-05 ENCOUNTER — Emergency Department (HOSPITAL_COMMUNITY)
Admission: EM | Admit: 2013-03-05 | Discharge: 2013-03-05 | Disposition: A | Discharge status: Home / Self Care | Payer: Self-pay | Attending: Emergency Medicine | Admitting: Emergency Medicine

## 2013-03-05 ENCOUNTER — Emergency Department (HOSPITAL_COMMUNITY): Payer: Self-pay

## 2013-03-05 ENCOUNTER — Encounter (HOSPITAL_COMMUNITY): Payer: Self-pay | Admitting: Emergency Medicine

## 2013-03-05 DIAGNOSIS — F17200 Nicotine dependence, unspecified, uncomplicated: Secondary | ICD-10-CM | POA: Insufficient documentation

## 2013-03-05 DIAGNOSIS — G40909 Epilepsy, unspecified, not intractable, without status epilepticus: Secondary | ICD-10-CM | POA: Insufficient documentation

## 2013-03-05 DIAGNOSIS — Q212 Atrioventricular septal defect, unspecified as to partial or complete: Secondary | ICD-10-CM | POA: Insufficient documentation

## 2013-03-05 DIAGNOSIS — I10 Essential (primary) hypertension: Secondary | ICD-10-CM | POA: Insufficient documentation

## 2013-03-05 DIAGNOSIS — Z21 Asymptomatic human immunodeficiency virus [HIV] infection status: Secondary | ICD-10-CM | POA: Insufficient documentation

## 2013-03-05 DIAGNOSIS — R569 Unspecified convulsions: Secondary | ICD-10-CM

## 2013-03-05 DIAGNOSIS — Z79899 Other long term (current) drug therapy: Secondary | ICD-10-CM | POA: Insufficient documentation

## 2013-03-05 DIAGNOSIS — Q282 Arteriovenous malformation of cerebral vessels: Secondary | ICD-10-CM

## 2013-03-05 LAB — COMPREHENSIVE METABOLIC PANEL
ALT: 92 U/L — ABNORMAL HIGH (ref 0–53)
AST: 57 U/L — ABNORMAL HIGH (ref 0–37)
Albumin: 3.6 g/dL (ref 3.5–5.2)
Alkaline Phosphatase: 80 U/L (ref 39–117)
CO2: 24 mEq/L (ref 19–32)
Chloride: 97 mEq/L (ref 96–112)
Creatinine, Ser: 1.06 mg/dL (ref 0.50–1.35)
GFR calc non Af Amer: 79 mL/min — ABNORMAL LOW (ref >90)
Potassium: 4.2 mEq/L (ref 3.5–5.1)
Sodium: 134 mEq/L — ABNORMAL LOW (ref 135–145)
Total Bilirubin: 0.3 mg/dL (ref 0.3–1.2)

## 2013-03-05 LAB — CBC WITH DIFFERENTIAL/PLATELET
Basophils Absolute: 0 10*3/uL (ref 0.0–0.1)
Basophils Relative: 1 % (ref 0–1)
HCT: 40.2 % (ref 39.0–52.0)
Lymphocytes Relative: 27 % (ref 12–46)
MCHC: 34.3 g/dL (ref 30.0–36.0)
Monocytes Absolute: 0.5 10*3/uL (ref 0.1–1.0)
Neutro Abs: 3.4 10*3/uL (ref 1.7–7.7)
Neutrophils Relative %: 62 % (ref 43–77)
RDW: 12.1 % (ref 11.5–15.5)
WBC: 5.6 10*3/uL (ref 4.0–10.5)

## 2013-03-05 LAB — URINALYSIS, ROUTINE W REFLEX MICROSCOPIC
Bilirubin Urine: NEGATIVE
Hgb urine dipstick: NEGATIVE
Nitrite: NEGATIVE
Protein, ur: NEGATIVE mg/dL
Urobilinogen, UA: 1 mg/dL (ref 0.0–1.0)

## 2013-03-05 MED ORDER — LEVETIRACETAM 500 MG PO TABS: 500.0000 mg | ORAL_TABLET | Freq: Two times a day (BID) | ORAL | Status: DC

## 2013-03-05 MED ORDER — SODIUM CHLORIDE 0.9 % IV SOLN
INTRAVENOUS | Status: AC
  Filled 2013-03-05: qty 50

## 2013-03-05 MED ORDER — IOHEXOL 300 MG/ML  SOLN
80.0000 mL | Freq: Once | INTRAMUSCULAR | Status: AC | PRN
  Administered 2013-03-05: 80 mL via INTRAVENOUS

## 2013-03-05 MED ORDER — PROMETHAZINE HCL 25 MG/ML IJ SOLN
25.0000 mg | Freq: Once | INTRAMUSCULAR | Status: AC
Start: 2013-03-05 — End: 2013-03-05
  Administered 2013-03-05: 25 mg via INTRAVENOUS
  Filled 2013-03-05: qty 1

## 2013-03-05 NOTE — ED Notes (Signed)
Pt had a seizure today, roommates stated they heard a thump and went to go check on the pt and he was actively seizing lasting about . Pt had incontinence of urine and oral trauma. Pt was post ictal upon EMS arrival, alert and oriented. Pt had a seizure last year but has not been seen for them and has not taken any medications for them. Pt has no other hx and not allergies. CBG 85, B/p 160/85
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
## 2013-03-05 NOTE — ED Notes (Signed)
Ekg shown to Dr. Freida Busman

## 2013-03-05 NOTE — ED Notes (Signed)
Went in room to d/c pt, pt not in room. Gown off and no personal items found in room. Surrounding area searched no sign of pt.

## 2013-03-05 NOTE — ED Provider Notes (Signed)
History    CSN: [id number] Arrival date & time 03/05/13  1513  First MD Initiated Contact with Patient 03/05/13 1518     Chief Complaint  Patient presents with  . Seizures   (Consider location/radiation/quality/duration/timing/severity/associated sxs/prior Treatment) Patient is a 52 y.o. male presenting with seizures. The history is provided by the patient.  Seizures Seizure activity on arrival: no    patient presents with a seizure. Patient had a tonic-clonic activity that was witnessed by roommates. He did a stone. He has a previous history of a seizure a year ago. He is not currently on medications for it. He states he's felt a little weak all over today. He is a dull headache and states he feels a little "slowed". No cough. No dysuria. Patient denies fever, however he has a temperature of 100.6 here. No injury from the seizure. He has HIV last CD4 count was nearly 500 his viral load was undetectable. He had an MRI done one year ago that showed some vascular abnormalities. He denies drug use, although he was a former drug user. He states he has been taking his medications. Patient thinks there is a chance he took a roommates medication also. Past Medical History  Diagnosis Date  . Hypertension   . HIV (human immunodeficiency virus infection)   . Seizures    History reviewed. No pertinent past surgical history. History reviewed. No pertinent family history. History  Substance Use Topics  . Smoking status: Smoker, Current Status Unknown -- 0.50 packs/day for 35 years    Types: Cigarettes    Start date: 10/28/2011  . Smokeless tobacco: Never Used  . Alcohol Use: No     Comment: pt has previous hx of 40 drinkns per week    Review of Systems  Constitutional: Positive for fatigue. Negative for fever, activity change and appetite change.  HENT: Negative for neck stiffness.   Eyes: Negative for pain.  Respiratory: Negative for chest tightness and shortness of breath.    Cardiovascular: Negative for chest pain and leg swelling.  Gastrointestinal: Negative for nausea, vomiting, abdominal pain and diarrhea.  Genitourinary: Negative for flank pain.  Musculoskeletal: Negative for back pain.  Skin: Negative for rash.  Neurological: Positive for seizures and headaches. Negative for weakness and numbness.  Psychiatric/Behavioral: Negative for behavioral problems.    Allergies  Review of patient's allergies indicates no known allergies.  Home Medications   Current Outpatient Rx  Name  Route  Sig  Dispense  Refill  . benazepril (LOTENSIN) 10 MG tablet   Oral   Take 1 tablet (10 mg total) by mouth daily.   30 tablet   prn   . efavirenz-emtricitabine-tenofovir (ATRIPLA) 600-200-300 MG per tablet   Oral   Take 1 tablet by mouth daily.         . hydrochlorothiazide (MICROZIDE) 12.5 MG capsule   Oral   Take 1 capsule (12.5 mg total) by mouth daily.   30 capsule   prn   . sildenafil (VIAGRA) 100 MG tablet   Oral   Take 100 mg by mouth daily as needed. For ED         . levETIRAcetam (KEPPRA) 500 MG tablet   Oral   Take 1 tablet (500 mg total) by mouth 2 (two) times daily.   60 tablet   0    BP 142/85  Pulse 92  Temp(Src) 99.2 F (37.3 C) (Oral)  Resp 16  Ht 5\' 9"  (1.753 m)  Wt 170 lb (77.111 kg)  BMI 25.09 kg/m2  SpO2 100% Physical Exam  Nursing note and vitals reviewed. Constitutional: He is oriented to person, place, and time. He appears well-developed and well-nourished.  He is not ill-appearing but looks a little uncomfortable  HENT:  Head: Normocephalic and atraumatic.  Patient has bite to right side of tongue.  Eyes: EOM are normal. Pupils are equal, round, and reactive to light.  Neck: Normal range of motion. Neck supple.  No meningismus  Cardiovascular: Normal rate, regular rhythm and normal heart sounds.   No murmur heard. Pulmonary/Chest: Effort normal and breath sounds normal.  Abdominal: Soft. Bowel sounds are  normal. He exhibits no distension and no mass. There is no tenderness. There is no rebound and no guarding.  Musculoskeletal: Normal range of motion. He exhibits no edema.  Neurological: He is alert and oriented to person, place, and time. No cranial nerve deficit.  Skin: Skin is warm and dry.  Psychiatric: He has a normal mood and affect.    ED Course  Procedures (including critical care time) Labs Reviewed  COMPREHENSIVE METABOLIC PANEL - Abnormal; Notable for the following:    Sodium 134 (*)    AST 57 (*)    ALT 92 (*)    GFR calc non Af Amer 79 (*)    All other components within normal limits  URINALYSIS, ROUTINE W REFLEX MICROSCOPIC - Abnormal; Notable for the following:    Color, Urine AMBER (*)    Specific Gravity, Urine 1.040 (*)    All other components within normal limits  CBC WITH DIFFERENTIAL   Dg Chest 2 View  03/05/2013   *RADIOLOGY REPORT*  Clinical Data: Seizure, fever, headache  CHEST - 2 VIEW  Comparison: Prior radiograph from 03/08/2012  Findings: The cardiac and mediastinal silhouettes are stable in size and contour, and remain within normal limits.  The lungs are well inflated.  No consolidation, pleural effusion, or pulmonary edema is identified.  There is no pleural effusion. No pneumothorax.  No acute osseous abnormality.  IMPRESSION: No active cardiopulmonary process.   Original Report Authenticated By: Rise Mu, M.D.   Ct Head W Wo Contrast  03/05/2013   *RADIOLOGY REPORT*  Clinical Data: And, seizure, severe headache  CT HEAD WITHOUT AND WITH CONTRAST  Technique:  Contiguous axial images were obtained from the base of the skull through the vertex without and with intravenous contrast.  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  Overall, the AVM measures approximately 4.2 x 2.8 cm, likely not significantly changed as compared to the prior examination.  No acute intracranial hemorrhage is identified on precontrast sequences.  There is no acute intracranial infarct.  No midline shift. No extra-axial fluid collection.  Paranasal sinuses and mastoid air cells are clear.  IMPRESSION: Similar appearance of left frontal lobe AVM with prominent draining vein into the vein of Galen.  No acute intracranial hemorrhage identified.   Original Report Authenticated By: Rise Mu, M.D.   1. Seizure   2. Cerebral AV malformation     MDM  Patient with repeat seizure. Last one was a year ago. Has known AVM. Labs reassuring. He has HIV the CD4 count is 500. After discussion with neurology and infectious disease patient had a head CT with contrast. He has  only a low-grade temperature and no meningeal signs. Doubt meningitis. Patient feels better after treatment with Phenergan. His headache is improving. He'll be discharged home.  Juliet Rude. Rubin Payor, MD 03/05/13 2016

## 2013-03-05 NOTE — ED Notes (Signed)
Patient states he still has a headache after taking the pain medications.

## 2013-03-05 NOTE — ED Notes (Signed)
Dr Pickering at bedside 

## 2013-03-23 ENCOUNTER — Ambulatory Visit: Payer: Self-pay

## 2013-03-23 ENCOUNTER — Other Ambulatory Visit: Payer: Self-pay | Admitting: Licensed Clinical Social Worker

## 2013-03-23 DIAGNOSIS — B20 Human immunodeficiency virus [HIV] disease: Secondary | ICD-10-CM

## 2013-03-23 MED ORDER — EFAVIRENZ-EMTRICITAB-TENOFOVIR 600-200-300 MG PO TABS: 1.0000 | ORAL_TABLET | Freq: Every day | ORAL | Status: DC

## 2013-05-08 ENCOUNTER — Other Ambulatory Visit: Payer: Self-pay | Admitting: Licensed Clinical Social Worker

## 2013-05-08 DIAGNOSIS — G40909 Epilepsy, unspecified, not intractable, without status epilepticus: Secondary | ICD-10-CM

## 2013-05-08 MED ORDER — LEVETIRACETAM 500 MG PO TABS: 500.0000 mg | ORAL_TABLET | Freq: Two times a day (BID) | ORAL | Status: DC

## 2013-05-15 ENCOUNTER — Ambulatory Visit: Payer: No Typology Code available for payment source | Admitting: Internal Medicine

## 2013-05-28 ENCOUNTER — Telehealth: Payer: Self-pay | Admitting: *Deleted

## 2013-05-28 NOTE — Telephone Encounter (Signed)
Received a call from Isaac [name] this morning wanting a reorder of his Viagra.  He wanted to know why I had not already ordered it.  I told him he had not called and requested a refill.  I ordered today and it should be here within the next 7 days.

## 2013-05-31 ENCOUNTER — Ambulatory Visit (INDEPENDENT_AMBULATORY_CARE_PROVIDER_SITE_OTHER): Payer: No Typology Code available for payment source | Admitting: Internal Medicine

## 2013-05-31 ENCOUNTER — Encounter: Payer: Self-pay | Admitting: Internal Medicine

## 2013-05-31 VITALS — BP 169/95 | HR 93 | Temp 98.4°F | Wt 176.0 lb

## 2013-05-31 DIAGNOSIS — I10 Essential (primary) hypertension: Secondary | ICD-10-CM

## 2013-05-31 DIAGNOSIS — G40909 Epilepsy, unspecified, not intractable, without status epilepticus: Secondary | ICD-10-CM

## 2013-05-31 DIAGNOSIS — N529 Male erectile dysfunction, unspecified: Secondary | ICD-10-CM

## 2013-05-31 DIAGNOSIS — B20 Human immunodeficiency virus [HIV] disease: Secondary | ICD-10-CM

## 2013-05-31 LAB — COMPLETE METABOLIC PANEL WITH GFR
ALT: 119 U/L — ABNORMAL HIGH (ref 0–53)
AST: 95 U/L — ABNORMAL HIGH (ref 0–37)
Alkaline Phosphatase: 70 U/L (ref 39–117)
CO2: 28 mEq/L (ref 19–32)
Creat: 0.92 mg/dL (ref 0.50–1.35)
GFR, Est African American: >89 mL/min
Total Bilirubin: 0.5 mg/dL (ref 0.3–1.2)

## 2013-05-31 LAB — CBC WITH DIFFERENTIAL/PLATELET
Basophils Absolute: 0 10*3/uL (ref 0.0–0.1)
Basophils Relative: 1 % (ref 0–1)
Eosinophils Absolute: 0.1 10*3/uL (ref 0.0–0.7)
Eosinophils Relative: 2 % (ref 0–5)
MCH: 28.4 pg (ref 26.0–34.0)
MCHC: 33.9 g/dL (ref 30.0–36.0)
MCV: 83.8 fL (ref 78.0–100.0)
Platelets: 298 10*3/uL (ref 150–400)
RDW: 13.3 % (ref 11.5–15.5)

## 2013-05-31 MED ORDER — LEVETIRACETAM 500 MG PO TABS: 500.0000 mg | ORAL_TABLET | Freq: Two times a day (BID) | ORAL | Status: DC

## 2013-05-31 MED ORDER — LISINOPRIL-HYDROCHLOROTHIAZIDE 10-12.5 MG PO TABS: 1.0000 | ORAL_TABLET | Freq: Every day | ORAL | Status: DC

## 2013-05-31 MED ORDER — SILDENAFIL CITRATE 100 MG PO TABS: 100.0000 mg | ORAL_TABLET | Freq: Every day | ORAL | Status: DC | PRN
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
## 2013-06-01 LAB — HEPATITIS C RNA QUANTITATIVE: HCV Quantitative Log: 6.13 {Log} — ABNORMAL HIGH (ref <1.18)

## 2013-06-01 LAB — HIV-1 RNA QUANT-NO REFLEX-BLD: HIV-1 RNA Quant, Log: <1.3 {Log} (ref <1.30)

## 2013-06-01 LAB — HEPATITIS B SURFACE ANTIBODY,QUALITATIVE

## 2013-06-21 ENCOUNTER — Other Ambulatory Visit: Payer: Self-pay

## 2013-07-09 ENCOUNTER — Ambulatory Visit: Payer: Self-pay | Admitting: Infectious Diseases

## 2013-08-15 ENCOUNTER — Telehealth: Payer: Self-pay | Admitting: *Deleted

## 2013-08-15 NOTE — Telephone Encounter (Signed)
Ordered Viagra for [employer] today. Will let him know.

## 2013-08-20 ENCOUNTER — Telehealth: Payer: Self-pay | Admitting: *Deleted

## 2013-08-20 NOTE — Telephone Encounter (Signed)
Called patient to notify him his viagra arrived and is ready for pick up.  Viagra 100mg  #30, Lot [id number], exp [id number].  Pt will come 12/16 to pick it up. Andree Coss, RN

## 2013-09-04 ENCOUNTER — Ambulatory Visit: Payer: Self-pay

## 2013-09-05 ENCOUNTER — Other Ambulatory Visit: Payer: Self-pay | Admitting: Infectious Diseases

## 2013-09-05 DIAGNOSIS — B20 Human immunodeficiency virus [HIV] disease: Secondary | ICD-10-CM

## 2013-09-24 ENCOUNTER — Other Ambulatory Visit: Payer: Self-pay

## 2013-10-01 ENCOUNTER — Other Ambulatory Visit (INDEPENDENT_AMBULATORY_CARE_PROVIDER_SITE_OTHER): Payer: Self-pay

## 2013-10-01 DIAGNOSIS — Z113 Encounter for screening for infections with a predominantly sexual mode of transmission: Secondary | ICD-10-CM

## 2013-10-01 DIAGNOSIS — Z79899 Other long term (current) drug therapy: Secondary | ICD-10-CM

## 2013-10-01 DIAGNOSIS — B20 Human immunodeficiency virus [HIV] disease: Secondary | ICD-10-CM

## 2013-10-01 LAB — CBC WITH DIFFERENTIAL/PLATELET
BASOS ABS: 0 10*3/uL (ref 0.0–0.1)
BASOS PCT: 1 % (ref 0–1)
Eosinophils Absolute: 0 10*3/uL (ref 0.0–0.7)
Eosinophils Relative: 1 % (ref 0–5)
HEMATOCRIT: 38.5 % — AB (ref 39.0–52.0)
Hemoglobin: 12.7 g/dL — ABNORMAL LOW (ref 13.0–17.0)
LYMPHS PCT: 37 % (ref 12–46)
Lymphs Abs: 2.1 10*3/uL (ref 0.7–4.0)
MCH: 27.9 pg (ref 26.0–34.0)
MCHC: 33 g/dL (ref 30.0–36.0)
MCV: 84.6 fL (ref 78.0–100.0)
MONO ABS: 0.4 10*3/uL (ref 0.1–1.0)
Monocytes Relative: 7 % (ref 3–12)
NEUTROS ABS: 3.2 10*3/uL (ref 1.7–7.7)
NEUTROS PCT: 54 % (ref 43–77)
Platelets: 338 10*3/uL (ref 150–400)
RBC: 4.55 MIL/uL (ref 4.22–5.81)
RDW: 13.3 % (ref 11.5–15.5)
WBC: 5.9 10*3/uL (ref 4.0–10.5)

## 2013-10-02 LAB — COMPREHENSIVE METABOLIC PANEL
ALBUMIN: 4.2 g/dL (ref 3.5–5.2)
ALK PHOS: 66 U/L (ref 39–117)
ALT: 88 U/L — AB (ref 0–53)
AST: 72 U/L — AB (ref 0–37)
BUN: 18 mg/dL (ref 6–23)
CALCIUM: 9.1 mg/dL (ref 8.4–10.5)
CHLORIDE: 102 meq/L (ref 96–112)
CO2: 26 mEq/L (ref 19–32)
Creat: 1.02 mg/dL (ref 0.50–1.35)
Glucose, Bld: 74 mg/dL (ref 70–99)
POTASSIUM: 4.2 meq/L (ref 3.5–5.3)
SODIUM: 139 meq/L (ref 135–145)
TOTAL PROTEIN: 7.6 g/dL (ref 6.0–8.3)
Total Bilirubin: 0.4 mg/dL (ref 0.3–1.2)

## 2013-10-02 LAB — T-HELPER CELL (CD4) - (RCID CLINIC ONLY)
CD4 T CELL ABS: 430 /uL (ref 400–2700)
CD4 T CELL HELPER: 20 % — AB (ref 33–55)

## 2013-10-02 LAB — LIPID PANEL
CHOL/HDL RATIO: 3.4 ratio
Cholesterol: 164 mg/dL (ref 0–200)
HDL: 48 mg/dL (ref >39)
LDL CALC: 91 mg/dL (ref 0–99)
Triglycerides: 124 mg/dL (ref <150)
VLDL: 25 mg/dL (ref 0–40)

## 2013-10-02 LAB — RPR

## 2013-10-03 ENCOUNTER — Telehealth: Payer: Self-pay | Admitting: *Deleted

## 2013-10-03 ENCOUNTER — Encounter: Payer: Self-pay | Admitting: *Deleted

## 2013-10-03 ENCOUNTER — Ambulatory Visit (INDEPENDENT_AMBULATORY_CARE_PROVIDER_SITE_OTHER): Payer: Self-pay | Admitting: Internal Medicine

## 2013-10-03 ENCOUNTER — Other Ambulatory Visit: Payer: Self-pay | Admitting: *Deleted

## 2013-10-03 ENCOUNTER — Encounter: Payer: Self-pay | Admitting: Internal Medicine

## 2013-10-03 VITALS — BP 151/90 | HR 81 | Temp 98.2°F | Ht 69.0 in | Wt 170.0 lb

## 2013-10-03 DIAGNOSIS — K08409 Partial loss of teeth, unspecified cause, unspecified class: Secondary | ICD-10-CM | POA: Insufficient documentation

## 2013-10-03 DIAGNOSIS — B1710 Acute hepatitis C without hepatic coma: Secondary | ICD-10-CM

## 2013-10-03 DIAGNOSIS — B20 Human immunodeficiency virus [HIV] disease: Secondary | ICD-10-CM

## 2013-10-03 DIAGNOSIS — K08109 Complete loss of teeth, unspecified cause, unspecified class: Secondary | ICD-10-CM

## 2013-10-03 DIAGNOSIS — J069 Acute upper respiratory infection, unspecified: Secondary | ICD-10-CM | POA: Insufficient documentation

## 2013-10-03 LAB — HIV-1 RNA QUANT-NO REFLEX-BLD
HIV 1 RNA Quant: <20 copies/mL (ref <20)
HIV-1 RNA Quant, Log: <1.3 {Log} (ref <1.30)

## 2013-10-03 MED ORDER — EFAVIRENZ-EMTRICITAB-TENOFOVIR 600-200-300 MG PO TABS
ORAL_TABLET | ORAL | Status: DC
Start: 2013-10-03 — End: 2013-11-15

## 2013-10-03 NOTE — Assessment & Plan Note (Signed)
Now with dry socket.  Discussed with dental, wwill start antibiotic.

## 2013-10-03 NOTE — Assessment & Plan Note (Addendum)
Active disease.  Possible options through [place] assistance for uninsured.  Will need to investigate options and then discuss with patient.  He does have active transaminitis and will need further work up.

## 2013-10-03 NOTE — Assessment & Plan Note (Signed)
Supportive care. 

## 2013-10-03 NOTE — Telephone Encounter (Signed)
Patient called to advise that he has been feeling bad and is having pain in his chest when he takes a deep breath. He advised he has had pneumonia and it feels like it did then. He is also having chills and body aches no fever but hot flashes. Gave the patient an appt with Dr Linus Salmons for today 10/03/13 at 930 am.

## 2013-10-03 NOTE — Progress Notes (Signed)
   Subjective:    Patient ID: Isaac [name], male    DOB: Sep 10, 1960, 53 y.o.   MRN: [id number]  HPI Comes in for a work in visit.  Some pleuritic chest pain with coughing.  Sinus and nasal congestion.  + sick contacts.  No fever, no diarrhea, no muscle aches.  Had flu-like symptoms about 2 weeks ago and recovered.     Also with pain and drainage in tooth that was extracted.     Review of Systems  Constitutional: Negative for fever, chills, activity change and appetite change.  HENT: Positive for dental problem, postnasal drip, rhinorrhea, sinus pressure and sore throat.   Respiratory: Positive for cough. Negative for shortness of breath and wheezing.   Cardiovascular:       Pleuritic chest pain with cough  Gastrointestinal: Negative for nausea and diarrhea.  Musculoskeletal: Negative for arthralgias and myalgias.  Skin: Negative for rash.  Neurological: Positive for headaches. Negative for dizziness, facial asymmetry and light-headedness.       Objective:   Physical Exam  Constitutional: He appears well-developed and well-nourished. No distress.  HENT:  Mouth/Throat: No oropharyngeal exudate.  Eyes: Right eye exhibits no discharge. Left eye exhibits no discharge. No scleral icterus.  Cardiovascular: Normal rate, regular rhythm and normal heart sounds.   No murmur heard. Pulmonary/Chest: Effort normal and breath sounds normal. No respiratory distress. He has no wheezes.  Abdominal: Soft. Bowel sounds are normal. He exhibits no distension. There is no tenderness.  Lymphadenopathy:    He has no cervical adenopathy.  Skin: No rash noted.          Assessment & Plan:

## 2013-10-08 ENCOUNTER — Encounter: Payer: Self-pay | Admitting: Infectious Diseases

## 2013-10-08 ENCOUNTER — Ambulatory Visit (INDEPENDENT_AMBULATORY_CARE_PROVIDER_SITE_OTHER): Payer: Self-pay | Admitting: Infectious Diseases

## 2013-10-08 ENCOUNTER — Ambulatory Visit: Payer: Self-pay | Admitting: Infectious Diseases

## 2013-10-08 ENCOUNTER — Ambulatory Visit: Payer: Self-pay

## 2013-10-08 VITALS — BP 134/87 | HR 92 | Temp 98.2°F | Ht 69.0 in | Wt 164.0 lb

## 2013-10-08 DIAGNOSIS — R569 Unspecified convulsions: Secondary | ICD-10-CM

## 2013-10-08 DIAGNOSIS — J019 Acute sinusitis, unspecified: Secondary | ICD-10-CM

## 2013-10-08 DIAGNOSIS — B20 Human immunodeficiency virus [HIV] disease: Secondary | ICD-10-CM

## 2013-10-08 DIAGNOSIS — F17200 Nicotine dependence, unspecified, uncomplicated: Secondary | ICD-10-CM

## 2013-10-08 MED ORDER — ACETAMINOPHEN-CODEINE #2 300-15 MG PO TABS: 1.0000 | ORAL_TABLET | Freq: Three times a day (TID) | ORAL | Status: DC | PRN

## 2013-10-08 MED ORDER — LEVOFLOXACIN 500 MG PO TABS: 500.0000 mg | ORAL_TABLET | Freq: Every day | ORAL | Status: DC

## 2013-10-08 NOTE — Progress Notes (Signed)
   Subjective:    Patient ID: VEDANSH KERSTETTER, male    DOB: 26-Nov-1960, 53 y.o.   MRN: [id number]  HPI 53 yo M with hx of HIV+ on atripla, recently seen for infected socket after tooth removal. He was started on amoxil. This has been improving.  For last 4 days has had increased yellow/green sinus d/c and headaches. He has had no f/c. He has taken ibuprofen without relief.  Denies missed doses of his atripla.   HIV 1 RNA Quant (copies/mL)  Date Value  10/01/2013 <20   05/31/2013 <20   02/09/2013 <20      CD4 T Cell Abs (/uL)  Date Value  10/01/2013 430   05/31/2013 400   02/09/2013 460       Review of Systems     Objective:   Physical Exam  Constitutional: He appears well-developed and well-nourished.  HENT:  Head:    Mouth/Throat:    Eyes: EOM are normal. Pupils are equal, round, and reactive to light.  Neck: Neck supple.  Cardiovascular: Normal rate, regular rhythm and normal heart sounds.   Pulmonary/Chest: Effort normal and breath sounds normal.  Abdominal: Soft. Bowel sounds are normal. He exhibits no distension. There is no tenderness.  Lymphadenopathy:    He has no cervical adenopathy.          Assessment & Plan:

## 2013-10-08 NOTE — Assessment & Plan Note (Signed)
Will give him course of levaquin to see if this improves his sx.

## 2013-10-08 NOTE — Assessment & Plan Note (Signed)
Has been seizure free. Back on keppra.

## 2013-10-08 NOTE — Assessment & Plan Note (Signed)
Has decreased to 3 cig/day

## 2013-10-08 NOTE — Assessment & Plan Note (Signed)
He is doing well. Will continue his atripla. He is offered/refuses condoms. vax are up to date. Consider checking Hep B S Ab at next visit.

## 2013-10-30 ENCOUNTER — Encounter (HOSPITAL_COMMUNITY): Payer: Self-pay | Admitting: Emergency Medicine

## 2013-10-30 ENCOUNTER — Emergency Department (HOSPITAL_COMMUNITY)
Admission: EM | Admit: 2013-10-30 | Discharge: 2013-10-30 | Disposition: A | Discharge status: Home / Self Care | Payer: Self-pay | Attending: Emergency Medicine | Admitting: Emergency Medicine

## 2013-10-30 ENCOUNTER — Emergency Department (HOSPITAL_COMMUNITY): Payer: Self-pay

## 2013-10-30 DIAGNOSIS — S0083XA Contusion of other part of head, initial encounter: Secondary | ICD-10-CM

## 2013-10-30 DIAGNOSIS — S0003XA Contusion of scalp, initial encounter: Secondary | ICD-10-CM | POA: Insufficient documentation

## 2013-10-30 DIAGNOSIS — Y929 Unspecified place or not applicable: Secondary | ICD-10-CM | POA: Insufficient documentation

## 2013-10-30 DIAGNOSIS — I10 Essential (primary) hypertension: Secondary | ICD-10-CM | POA: Insufficient documentation

## 2013-10-30 DIAGNOSIS — Z21 Asymptomatic human immunodeficiency virus [HIV] infection status: Secondary | ICD-10-CM | POA: Insufficient documentation

## 2013-10-30 DIAGNOSIS — R569 Unspecified convulsions: Secondary | ICD-10-CM

## 2013-10-30 DIAGNOSIS — G40909 Epilepsy, unspecified, not intractable, without status epilepticus: Secondary | ICD-10-CM | POA: Insufficient documentation

## 2013-10-30 DIAGNOSIS — W1809XA Striking against other object with subsequent fall, initial encounter: Secondary | ICD-10-CM | POA: Insufficient documentation

## 2013-10-30 DIAGNOSIS — S1093XA Contusion of unspecified part of neck, initial encounter: Secondary | ICD-10-CM

## 2013-10-30 DIAGNOSIS — F17200 Nicotine dependence, unspecified, uncomplicated: Secondary | ICD-10-CM | POA: Insufficient documentation

## 2013-10-30 DIAGNOSIS — Z79899 Other long term (current) drug therapy: Secondary | ICD-10-CM | POA: Insufficient documentation

## 2013-10-30 DIAGNOSIS — Y939 Activity, unspecified: Secondary | ICD-10-CM | POA: Insufficient documentation

## 2013-10-30 LAB — COMPREHENSIVE METABOLIC PANEL
ALK PHOS: 64 U/L (ref 39–117)
ALT: 52 U/L (ref 0–53)
AST: 39 U/L — ABNORMAL HIGH (ref 0–37)
Albumin: 3.6 g/dL (ref 3.5–5.2)
BILIRUBIN TOTAL: 0.2 mg/dL — AB (ref 0.3–1.2)
BUN: 16 mg/dL (ref 6–23)
CHLORIDE: 100 meq/L (ref 96–112)
CO2: 20 mEq/L (ref 19–32)
Calcium: 8.9 mg/dL (ref 8.4–10.5)
Creatinine, Ser: 1.06 mg/dL (ref 0.50–1.35)
GFR, EST NON AFRICAN AMERICAN: 79 mL/min — AB (ref >90)
GLUCOSE: 136 mg/dL — AB (ref 70–99)
POTASSIUM: 3.7 meq/L (ref 3.7–5.3)
Sodium: 139 mEq/L (ref 137–147)
Total Protein: 7.6 g/dL (ref 6.0–8.3)

## 2013-10-30 LAB — CBC WITH DIFFERENTIAL/PLATELET
Basophils Absolute: 0 10*3/uL (ref 0.0–0.1)
Basophils Relative: 0 % (ref 0–1)
EOS ABS: 0.1 10*3/uL (ref 0.0–0.7)
Eosinophils Relative: 2 % (ref 0–5)
HEMATOCRIT: 40.6 % (ref 39.0–52.0)
Hemoglobin: 13.6 g/dL (ref 13.0–17.0)
Lymphocytes Relative: 37 % (ref 12–46)
Lymphs Abs: 1.5 10*3/uL (ref 0.7–4.0)
MCH: 28.6 pg (ref 26.0–34.0)
MCHC: 33.5 g/dL (ref 30.0–36.0)
MCV: 85.5 fL (ref 78.0–100.0)
Monocytes Absolute: 0.5 10*3/uL (ref 0.1–1.0)
Monocytes Relative: 11 % (ref 3–12)
Neutro Abs: 2 10*3/uL (ref 1.7–7.7)
Neutrophils Relative %: 49 % (ref 43–77)
PLATELETS: 224 10*3/uL (ref 150–400)
RBC: 4.75 MIL/uL (ref 4.22–5.81)
RDW: 11.9 % (ref 11.5–15.5)
WBC: 4.1 10*3/uL (ref 4.0–10.5)

## 2013-10-30 MED ORDER — SODIUM CHLORIDE 0.9 % IV SOLN
1000.0000 mg | Freq: Once | INTRAVENOUS | Status: AC
  Administered 2013-10-30: 1000 mg via INTRAVENOUS
  Filled 2013-10-30: qty 10

## 2013-10-30 MED ORDER — ACETAMINOPHEN 500 MG PO TABS
1000.0000 mg | ORAL_TABLET | Freq: Once | ORAL | Status: AC
  Administered 2013-10-30: 1000 mg via ORAL
  Filled 2013-10-30: qty 2

## 2013-10-30 NOTE — ED Notes (Signed)
Pt from [facility] had seizure 2 hours ago. Then second seizure 30 minutes prior to EMS arriving. Pt fell face first with second seizure. Left eye brow has hematoma. Pt has hx seizures and takes Keppra for them. However, he has been out of meds for 1-2 days. CBG 176. BP 160/107, Sinus tach 103, 97%RA.

## 2013-10-30 NOTE — ED Notes (Signed)
Put ice on pt's left eye brow

## 2013-10-30 NOTE — ED Provider Notes (Signed)
CSN: [id number]     Arrival date & time 10/30/13  1318 History   First MD Initiated Contact with Patient 10/30/13 1324     Chief Complaint  Patient presents with  . Seizures     (Consider location/radiation/quality/duration/timing/severity/associated sxs/prior Treatment) The history is provided by the patient.  EARMON SHERROW is a 53 y.o. male hx of HIV (CD4 97 a month ago), HTN, seizures here with seizure. He hasn't been taking his keppra for the last two days. Today he had a seizure 2 hours ago. He went to a cafeteria and then had another seizure. He fell and hit L eyebrow. He went to pharmacy today to pick up his keppra but hasn't take it yet. Denies fever or chills. He smokes but denies any alcohol or drug use.    Past Medical History  Diagnosis Date  . Hypertension   . HIV (human immunodeficiency virus infection)   . Seizures    History reviewed. No pertinent past surgical history. History reviewed. No pertinent family history. History  Substance Use Topics  . Smoking status: Current Every Day Smoker -- 0.20 packs/day for 35 years    Types: Cigarettes    Start date: 10/28/2011  . Smokeless tobacco: Never Used  . Alcohol Use: No     Comment: pt has previous hx of 40 drinkns per week    Review of Systems  Neurological: Positive for seizures.  All other systems reviewed and are negative.      Allergies  Review of patient's allergies indicates no known allergies.  Home Medications   Current Outpatient Rx  Name  Route  Sig  Dispense  Refill  . acetaminophen-codeine (TYLENOL #2) 300-15 MG per tablet   Oral   Take 1 tablet by mouth every 8 (eight) hours as needed for moderate pain.   10 tablet   0   . benazepril (LOTENSIN) 10 MG tablet   Oral   Take 1 tablet (10 mg total) by mouth daily.   30 tablet   prn   . efavirenz-emtricitabine-tenofovir (ATRIPLA) 600-200-300 MG per tablet      TAKE 1 TABLET BY MOUTH DAILY   30 tablet   5   .  lisinopril-hydrochlorothiazide (PRINZIDE) 10-12.5 MG per tablet   Oral   Take 1 tablet by mouth daily.   30 tablet   5   . levETIRAcetam (KEPPRA) 500 MG tablet   Oral   Take 1 tablet (500 mg total) by mouth 2 (two) times daily.   60 tablet   11   . sildenafil (VIAGRA) 100 MG tablet   Oral   Take 1 tablet (100 mg total) by mouth daily as needed.   10 tablet   1    BP 138/83  Pulse 74  Temp(Src) 99 F (37.2 C) (Oral)  Resp 13  Ht 5\' 9"  (1.753 m)  Wt 165 lb (74.844 kg)  BMI 24.36 kg/m2  SpO2 99% Physical Exam  Nursing note and vitals reviewed. Constitutional: He is oriented to person, place, and time. He appears well-developed and well-nourished.  HENT:  Head: Normocephalic.  Mouth/Throat: Oropharynx is clear and moist.  Small hematoma above L eyebrow   Eyes: Conjunctivae and EOM are normal. Pupils are equal, round, and reactive to light.  Neck: Normal range of motion. Neck supple.  No midline tenderness   Cardiovascular: Normal rate, regular rhythm and normal heart sounds.   Pulmonary/Chest: Effort normal and breath sounds normal. No respiratory distress. He has no wheezes. He has  no rales. He exhibits no tenderness.  Abdominal: Soft. Bowel sounds are normal. He exhibits no distension. There is no tenderness. There is no rebound and no guarding.  Musculoskeletal: Normal range of motion. He exhibits no edema and no tenderness.  Neurological: He is alert and oriented to person, place, and time. No cranial nerve deficit. Coordination normal.  Nl strength throughout. Not post ictal   Skin: Skin is warm and dry.  Psychiatric: He has a normal mood and affect. His behavior is normal. Judgment and thought content normal.    ED Course  Procedures (including critical care time) Labs Review Labs Reviewed  COMPREHENSIVE METABOLIC PANEL - Abnormal; Notable for the following:    Glucose, Bld 136 (*)    AST 39 (*)    Total Bilirubin 0.2 (*)    GFR calc non Af Amer 79 (*)     All other components within normal limits  CBC WITH DIFFERENTIAL   Imaging Review Ct Head Wo Contrast  10/30/2013   CLINICAL DATA:  Seizure.  EXAM: CT HEAD WITHOUT CONTRAST  TECHNIQUE: Contiguous axial images were obtained from the base of the skull through the vertex without intravenous contrast.  COMPARISON:  CT head with and without contrast 03/05/2013.  FINDINGS: Arteriovenous malformation in the left frontal lobe with a prominent draining vein extending posteriorly into the vein of Galen is again seen. The appearance is unchanged. There is no hemorrhage. No evidence of acute infarction, midline shift, hydrocephalus or abnormal extra-axial fluid collection is identified. Minimal mucosal thickening left maxillary sinus is noted. Calvarium is intact.  IMPRESSION: No acute finding.  Left frontal lobe AVM is unchanged in appearance.   Electronically Signed   By: Inge Rise M.D.   On: 10/30/2013 14:19    EKG Interpretation   None       MDM   Final diagnoses:  None   JORDELL OUTTEN is a 53 y.o. male here with seizure from medication uncompliance. CT showed no bleed. Will load with 1g keppra IV. He has the keppra prescription with him and I told him to take it as prescribed.    Wandra Arthurs, MD 10/30/13 [phone]

## 2013-10-30 NOTE — Discharge Instructions (Signed)
Take your keppra 500 mg twice a day as prescribed.   Follow up with your doctor.   Take tylenol or motrin for pain.   Return to ER if you have seizure, headache, vomiting.

## 2013-10-31 ENCOUNTER — Telehealth: Payer: Self-pay | Admitting: *Deleted

## 2013-10-31 ENCOUNTER — Other Ambulatory Visit: Payer: Self-pay | Admitting: *Deleted

## 2013-10-31 DIAGNOSIS — N529 Male erectile dysfunction, unspecified: Secondary | ICD-10-CM

## 2013-10-31 MED ORDER — SILDENAFIL CITRATE 100 MG PO TABS
100.0000 mg | ORAL_TABLET | Freq: Every day | ORAL | Status: DC | PRN
Start: 2013-10-31 — End: 2014-06-17

## 2013-10-31 NOTE — Telephone Encounter (Signed)
Faxed application for Viagra to [facility] today. °

## 2013-11-15 ENCOUNTER — Other Ambulatory Visit: Payer: Self-pay | Admitting: Licensed Clinical Social Worker

## 2013-11-15 DIAGNOSIS — B20 Human immunodeficiency virus [HIV] disease: Secondary | ICD-10-CM

## 2013-11-15 MED ORDER — EFAVIRENZ-EMTRICITAB-TENOFOVIR 600-200-300 MG PO TABS: ORAL_TABLET | ORAL | Status: DC

## 2013-11-19 ENCOUNTER — Telehealth: Payer: Self-pay | Admitting: *Deleted

## 2013-11-19 ENCOUNTER — Other Ambulatory Visit: Payer: Self-pay | Admitting: Internal Medicine

## 2013-11-19 DIAGNOSIS — I10 Essential (primary) hypertension: Secondary | ICD-10-CM

## 2013-11-19 NOTE — Telephone Encounter (Signed)
[employer].  Draylon has been approved for his Viagra through 11-15-14.  Called patient and told him.

## 2013-11-21 ENCOUNTER — Telehealth: Payer: Self-pay | Admitting: Licensed Clinical Social Worker

## 2013-11-21 NOTE — Telephone Encounter (Signed)
Patient assistance medication Viagra arrived, patient aware and will pick up.

## 2014-01-08 ENCOUNTER — Telehealth: Payer: Self-pay | Admitting: Licensed Clinical Social Worker

## 2014-01-08 NOTE — Telephone Encounter (Signed)
If he does not have fever, would give him zyrtec or claritin over the counter

## 2014-01-08 NOTE — Telephone Encounter (Signed)
Patient called complaining of ha, coughing, sinus pressure since the weekend, we do not have any openings in the clinic this week. Patient would like something called in if possible. Please advise

## 2014-01-10 NOTE — Telephone Encounter (Signed)
Patient stated he would try this.

## 2014-02-05 ENCOUNTER — Telehealth: Payer: Self-pay | Admitting: *Deleted

## 2014-02-05 NOTE — Telephone Encounter (Signed)
Called patient and notified that his patient assistance medication, Viagra has arrived and is ready for pick up. Lot # [id number], exp. 08/05/18. Myrtis Hopping

## 2014-04-10 ENCOUNTER — Other Ambulatory Visit: Payer: Self-pay

## 2014-04-16 ENCOUNTER — Other Ambulatory Visit: Payer: Self-pay | Admitting: Licensed Clinical Social Worker

## 2014-04-16 DIAGNOSIS — B20 Human immunodeficiency virus [HIV] disease: Secondary | ICD-10-CM

## 2014-04-16 MED ORDER — EFAVIRENZ-EMTRICITAB-TENOFOVIR 600-200-300 MG PO TABS: ORAL_TABLET | ORAL | Status: DC

## 2014-04-24 ENCOUNTER — Ambulatory Visit: Payer: Self-pay | Admitting: Infectious Diseases

## 2014-04-29 ENCOUNTER — Telehealth: Payer: Self-pay | Admitting: *Deleted

## 2014-04-29 NOTE — Telephone Encounter (Signed)
Re-ordered Viagra today.  Should receive within 7-10 days.

## 2014-05-07 ENCOUNTER — Telehealth: Payer: Self-pay | Admitting: *Deleted

## 2014-05-07 NOTE — Telephone Encounter (Signed)
Patient picked up his patient assistance viagra. 100mg  #30 lot [id number] exp 11/04/18.

## 2014-05-09 ENCOUNTER — Other Ambulatory Visit: Payer: Self-pay | Admitting: Internal Medicine

## 2014-05-10 ENCOUNTER — Other Ambulatory Visit (INDEPENDENT_AMBULATORY_CARE_PROVIDER_SITE_OTHER): Payer: Self-pay

## 2014-05-10 DIAGNOSIS — B20 Human immunodeficiency virus [HIV] disease: Secondary | ICD-10-CM

## 2014-05-10 DIAGNOSIS — Z113 Encounter for screening for infections with a predominantly sexual mode of transmission: Secondary | ICD-10-CM

## 2014-05-10 DIAGNOSIS — Z79899 Other long term (current) drug therapy: Secondary | ICD-10-CM

## 2014-05-10 LAB — LIPID PANEL
Cholesterol: 140 mg/dL (ref 0–200)
HDL: 32 mg/dL — ABNORMAL LOW (ref >39)
LDL Cholesterol: 70 mg/dL (ref 0–99)
Total CHOL/HDL Ratio: 4.4 Ratio
Triglycerides: 188 mg/dL — ABNORMAL HIGH (ref <150)
VLDL: 38 mg/dL (ref 0–40)

## 2014-05-10 LAB — CBC WITH DIFFERENTIAL/PLATELET
BASOS ABS: 0 10*3/uL (ref 0.0–0.1)
Basophils Relative: 1 % (ref 0–1)
EOS PCT: 2 % (ref 0–5)
Eosinophils Absolute: 0.1 10*3/uL (ref 0.0–0.7)
HCT: 38.2 % — ABNORMAL LOW (ref 39.0–52.0)
Hemoglobin: 12.7 g/dL — ABNORMAL LOW (ref 13.0–17.0)
LYMPHS PCT: 44 % (ref 12–46)
Lymphs Abs: 1.6 10*3/uL (ref 0.7–4.0)
MCH: 27.8 pg (ref 26.0–34.0)
MCHC: 33.2 g/dL (ref 30.0–36.0)
MCV: 83.6 fL (ref 78.0–100.0)
Monocytes Absolute: 0.3 10*3/uL (ref 0.1–1.0)
Monocytes Relative: 8 % (ref 3–12)
NEUTROS PCT: 45 % (ref 43–77)
Neutro Abs: 1.6 10*3/uL — ABNORMAL LOW (ref 1.7–7.7)
Platelets: 332 10*3/uL (ref 150–400)
RBC: 4.57 MIL/uL (ref 4.22–5.81)
RDW: 13 % (ref 11.5–15.5)
WBC: 3.6 10*3/uL — AB (ref 4.0–10.5)

## 2014-05-10 LAB — HEPATITIS B SURFACE ANTIBODY,QUALITATIVE

## 2014-05-10 LAB — COMPLETE METABOLIC PANEL WITH GFR
ALBUMIN: 4 g/dL (ref 3.5–5.2)
ALT: 108 U/L — ABNORMAL HIGH (ref 0–53)
AST: 78 U/L — ABNORMAL HIGH (ref 0–37)
Alkaline Phosphatase: 66 U/L (ref 39–117)
BUN: 17 mg/dL (ref 6–23)
CALCIUM: 8.9 mg/dL (ref 8.4–10.5)
CO2: 27 meq/L (ref 19–32)
Chloride: 105 mEq/L (ref 96–112)
Creat: 1.17 mg/dL (ref 0.50–1.35)
GFR, EST AFRICAN AMERICAN: 82 mL/min
GFR, EST NON AFRICAN AMERICAN: 71 mL/min
GLUCOSE: 81 mg/dL (ref 70–99)
POTASSIUM: 4.4 meq/L (ref 3.5–5.3)
Sodium: 139 mEq/L (ref 135–145)
TOTAL PROTEIN: 7.1 g/dL (ref 6.0–8.3)
Total Bilirubin: 0.5 mg/dL (ref 0.2–1.2)

## 2014-05-10 LAB — RPR

## 2014-05-10 LAB — T-HELPER CELL (CD4) - (RCID CLINIC ONLY)
CD4 % Helper T Cell: 25 % — ABNORMAL LOW (ref 33–55)
CD4 T CELL ABS: 390 /uL — AB (ref 400–2700)

## 2014-05-12 LAB — HIV-1 RNA QUANT-NO REFLEX-BLD
HIV 1 RNA Quant: <20 copies/mL (ref <20)
HIV-1 RNA Quant, Log: <1.3 {Log} (ref <1.30)

## 2014-05-17 ENCOUNTER — Other Ambulatory Visit: Payer: Self-pay | Admitting: Internal Medicine

## 2014-05-20 ENCOUNTER — Other Ambulatory Visit: Payer: Self-pay | Admitting: *Deleted

## 2014-05-20 DIAGNOSIS — G40909 Epilepsy, unspecified, not intractable, without status epilepticus: Secondary | ICD-10-CM

## 2014-05-20 MED ORDER — LEVETIRACETAM 500 MG PO TABS: 500.0000 mg | ORAL_TABLET | Freq: Two times a day (BID) | ORAL | Status: DC

## 2014-05-22 ENCOUNTER — Ambulatory Visit: Payer: Self-pay | Admitting: Infectious Diseases

## 2014-06-12 ENCOUNTER — Ambulatory Visit: Payer: Self-pay | Admitting: Infectious Diseases

## 2014-06-17 ENCOUNTER — Encounter: Payer: Self-pay | Admitting: Infectious Diseases

## 2014-06-17 ENCOUNTER — Other Ambulatory Visit: Payer: Self-pay | Admitting: *Deleted

## 2014-06-17 ENCOUNTER — Ambulatory Visit (INDEPENDENT_AMBULATORY_CARE_PROVIDER_SITE_OTHER): Payer: Self-pay | Admitting: Infectious Diseases

## 2014-06-17 VITALS — BP 148/97 | HR 90 | Temp 98.0°F | Wt 162.0 lb

## 2014-06-17 DIAGNOSIS — R569 Unspecified convulsions: Secondary | ICD-10-CM

## 2014-06-17 DIAGNOSIS — Z113 Encounter for screening for infections with a predominantly sexual mode of transmission: Secondary | ICD-10-CM

## 2014-06-17 DIAGNOSIS — N521 Erectile dysfunction due to diseases classified elsewhere: Secondary | ICD-10-CM

## 2014-06-17 DIAGNOSIS — B20 Human immunodeficiency virus [HIV] disease: Secondary | ICD-10-CM

## 2014-06-17 DIAGNOSIS — B182 Chronic viral hepatitis C: Secondary | ICD-10-CM

## 2014-06-17 DIAGNOSIS — Z23 Encounter for immunization: Secondary | ICD-10-CM

## 2014-06-17 DIAGNOSIS — Z79899 Other long term (current) drug therapy: Secondary | ICD-10-CM

## 2014-06-17 DIAGNOSIS — I10 Essential (primary) hypertension: Secondary | ICD-10-CM

## 2014-06-17 MED ORDER — SILDENAFIL CITRATE 100 MG PO TABS: 100.0000 mg | ORAL_TABLET | Freq: Every day | ORAL | Status: DC | PRN

## 2014-06-17 MED ORDER — EFAVIRENZ-EMTRICITAB-TENOFOVIR 600-200-300 MG PO TABS: ORAL_TABLET | ORAL | Status: DC

## 2014-06-17 MED ORDER — EFAVIRENZ-EMTRICITAB-TENOFOVIR 600-200-300 MG PO TABS
ORAL_TABLET | ORAL | Status: DC
Start: 2014-06-17 — End: 2014-09-03

## 2014-06-17 NOTE — Assessment & Plan Note (Signed)
He is doing well, offered/refuses condoms. Gets flu shot today.  Will continue his atripla til we know if he will get Hep C rx.  Needs dental f/u.  Will see him back in 2-3 months.

## 2014-06-17 NOTE — Assessment & Plan Note (Signed)
Will continue on his current rx.

## 2014-06-17 NOTE — Assessment & Plan Note (Signed)
Has had 1 seizure ("in my sleep") in last 2 months. Will continue keppra, try to have him seen in neuro.

## 2014-06-17 NOTE — Assessment & Plan Note (Signed)
Will send screening labs on pt.  Check elastography Will send note to [facility] to see if we can get him onto assistance program.

## 2014-06-17 NOTE — Progress Notes (Signed)
   Subjective:    Patient ID: Isaac [name], male    DOB: 1961-02-08, 53 y.o.   MRN: [id number]  HPI 53 yo M with hx of HIV+, tobacco use, cigar smoking and poor dentition.  No problems with atripla.  Still waiting for dental.   HIV 1 RNA Quant (copies/mL)  Date Value  05/10/2014 <20   10/01/2013 <20   05/31/2013 <20      CD4 T Cell Abs (/uL)  Date Value  05/10/2014 390*  10/01/2013 430   05/31/2013 400     Review of Systems  Constitutional: Positive for unexpected weight change. Negative for appetite change.  Gastrointestinal: Negative for diarrhea and constipation.  Genitourinary: Negative for difficulty urinating.  Psychiatric/Behavioral: Negative for sleep disturbance.       Objective:   Physical Exam  Constitutional: He appears well-developed and well-nourished.  Eyes: EOM are normal. Pupils are equal, round, and reactive to light.  Neck: Neck supple.  Cardiovascular: Normal rate, regular rhythm and normal heart sounds.   Pulmonary/Chest: Effort normal and breath sounds normal.  Abdominal: Soft. Bowel sounds are normal. He exhibits no distension. There is no tenderness.  Lymphadenopathy:    He has no cervical adenopathy.          Assessment & Plan:

## 2014-06-18 ENCOUNTER — Telehealth: Payer: Self-pay | Admitting: *Deleted

## 2014-06-18 ENCOUNTER — Other Ambulatory Visit: Payer: Self-pay | Admitting: Infectious Diseases

## 2014-06-18 DIAGNOSIS — B182 Chronic viral hepatitis C: Secondary | ICD-10-CM

## 2014-06-18 NOTE — Telephone Encounter (Signed)
I will need to refer patient to [facility] neurological clinic once he has the discount card

## 2014-06-18 NOTE — Telephone Encounter (Signed)
Patient stated he would call me once he has renewed his [employer] discount card. I will then be able to schedule the ultrasound/elastography. Myrtis Hopping

## 2014-06-19 ENCOUNTER — Other Ambulatory Visit: Payer: Self-pay

## 2014-06-21 ENCOUNTER — Other Ambulatory Visit: Payer: Self-pay | Admitting: Infectious Diseases

## 2014-06-21 DIAGNOSIS — R569 Unspecified convulsions: Secondary | ICD-10-CM

## 2014-06-26 ENCOUNTER — Other Ambulatory Visit: Payer: Self-pay

## 2014-07-15 ENCOUNTER — Encounter (HOSPITAL_COMMUNITY): Payer: Self-pay | Admitting: *Deleted

## 2014-07-15 ENCOUNTER — Emergency Department (HOSPITAL_COMMUNITY): Payer: Self-pay

## 2014-07-15 ENCOUNTER — Telehealth: Payer: Self-pay | Admitting: *Deleted

## 2014-07-15 ENCOUNTER — Emergency Department (HOSPITAL_COMMUNITY)
Admission: EM | Admit: 2014-07-15 | Discharge: 2014-07-15 | Disposition: A | Discharge status: Home / Self Care | Payer: Self-pay | Attending: Emergency Medicine | Admitting: Emergency Medicine

## 2014-07-15 DIAGNOSIS — F0781 Postconcussional syndrome: Secondary | ICD-10-CM | POA: Insufficient documentation

## 2014-07-15 DIAGNOSIS — F1010 Alcohol abuse, uncomplicated: Secondary | ICD-10-CM | POA: Insufficient documentation

## 2014-07-15 DIAGNOSIS — R079 Chest pain, unspecified: Secondary | ICD-10-CM | POA: Insufficient documentation

## 2014-07-15 DIAGNOSIS — Z21 Asymptomatic human immunodeficiency virus [HIV] infection status: Secondary | ICD-10-CM | POA: Insufficient documentation

## 2014-07-15 DIAGNOSIS — G40909 Epilepsy, unspecified, not intractable, without status epilepticus: Secondary | ICD-10-CM | POA: Insufficient documentation

## 2014-07-15 DIAGNOSIS — I10 Essential (primary) hypertension: Secondary | ICD-10-CM | POA: Insufficient documentation

## 2014-07-15 DIAGNOSIS — Z79899 Other long term (current) drug therapy: Secondary | ICD-10-CM | POA: Insufficient documentation

## 2014-07-15 DIAGNOSIS — G8929 Other chronic pain: Secondary | ICD-10-CM | POA: Insufficient documentation

## 2014-07-15 DIAGNOSIS — F1410 Cocaine abuse, uncomplicated: Secondary | ICD-10-CM | POA: Insufficient documentation

## 2014-07-15 DIAGNOSIS — Z720 Tobacco use: Secondary | ICD-10-CM | POA: Insufficient documentation

## 2014-07-15 DIAGNOSIS — G44309 Post-traumatic headache, unspecified, not intractable: Secondary | ICD-10-CM | POA: Insufficient documentation

## 2014-07-15 MED ORDER — BUTALBITAL-APAP-CAFFEINE 50-325-40 MG PO TABS: 1.0000 | ORAL_TABLET | Freq: Four times a day (QID) | ORAL | Status: DC | PRN

## 2014-07-15 MED ORDER — OXYCODONE-ACETAMINOPHEN 5-325 MG PO TABS
2.0000 | ORAL_TABLET | Freq: Once | ORAL | Status: AC
  Administered 2014-07-15: 2 via ORAL
  Filled 2014-07-15: qty 2

## 2014-07-15 NOTE — ED Notes (Signed)
Patient transported to X-ray 
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
## 2014-07-15 NOTE — Telephone Encounter (Signed)
Patient would like to reorder his viagra via [facility] and Patient Assistance. Landis Gandy, RN

## 2014-07-15 NOTE — Discharge Instructions (Signed)
Alcohol Use Disorder °Alcohol use disorder is a mental disorder. It is not a one-time incident of heavy drinking. Alcohol use disorder is the excessive and uncontrollable use of alcohol over time that leads to problems with functioning in one or more areas of daily living. People with this disorder risk harming themselves and others when they drink to excess. Alcohol use disorder also can cause other mental disorders, such as mood and anxiety disorders, and serious physical problems. People with alcohol use disorder often misuse other drugs.  °Alcohol use disorder is common and widespread. Some people with this disorder drink alcohol to cope with or escape from negative life events. Others drink to relieve chronic pain or symptoms of mental illness. People with a family history of alcohol use disorder are at higher risk of losing control and using alcohol to excess.  °SYMPTOMS  °Signs and symptoms of alcohol use disorder may include the following:  °· Consumption of alcohol in larger amounts or over a longer period of time than intended. °· Multiple unsuccessful attempts to cut down or control alcohol use.   °· A great deal of time spent obtaining alcohol, using alcohol, or recovering from the effects of alcohol (hangover). °· A strong desire or urge to use alcohol (cravings).   °· Continued use of alcohol despite problems at work, school, or home because of alcohol use.   °· Continued use of alcohol despite problems in relationships because of alcohol use. °· Continued use of alcohol in situations when it is physically hazardous, such as driving a car. °· Continued use of alcohol despite awareness of a physical or psychological problem that is likely related to alcohol use. Physical problems related to alcohol use can involve the brain, heart, liver, stomach, and intestines. Psychological problems related to alcohol use include intoxication, depression, anxiety, psychosis, delirium, and dementia.   °· The need for  increased amounts of alcohol to achieve the same desired effect, or a decreased effect from the consumption of the same amount of alcohol (tolerance). °· Withdrawal symptoms upon reducing or stopping alcohol use, or alcohol use to reduce or avoid withdrawal symptoms. Withdrawal symptoms include: °· Racing heart. °· Hand tremor. °· Difficulty sleeping. °· Nausea. °· Vomiting. °· Hallucinations. °· Restlessness. °· Seizures. °DIAGNOSIS °Alcohol use disorder is diagnosed through an assessment by your health care provider. Your health care provider may start by asking three or four questions to screen for excessive or problematic alcohol use. To confirm a diagnosis of alcohol use disorder, at least two symptoms must be present within a 12-month period. The severity of alcohol use disorder depends on the number of symptoms: °· Mild--two or three. °· Moderate--four or five. °· Severe--six or more. °Your health care provider may perform a physical exam or use results from lab tests to see if you have physical problems resulting from alcohol use. Your health care provider may refer you to a mental health professional for evaluation. °TREATMENT  °Some people with alcohol use disorder are able to reduce their alcohol use to low-risk levels. Some people with alcohol use disorder need to quit drinking alcohol. When necessary, mental health professionals with specialized training in substance use treatment can help. Your health care provider can help you decide how severe your alcohol use disorder is and what type of treatment you need. The following forms of treatment are available:  °· Detoxification. Detoxification involves the use of prescription medicines to prevent alcohol withdrawal symptoms in the first week after quitting. This is important for people with a history of symptoms   of withdrawal and for heavy drinkers who are likely to have withdrawal symptoms. Alcohol withdrawal can be dangerous and, in severe cases, cause  death. Detoxification is usually provided in a hospital or in-patient substance use treatment facility.  Counseling or talk therapy. Talk therapy is provided by substance use treatment counselors. It addresses the reasons people use alcohol and ways to keep them from drinking again. The goals of talk therapy are to help people with alcohol use disorder find healthy activities and ways to cope with life stress, to identify and avoid triggers for alcohol use, and to handle cravings, which can cause relapse.  Medicines.Different medicines can help treat alcohol use disorder through the following actions:  Decrease alcohol cravings.  Decrease the positive reward response felt from alcohol use.  Produce an uncomfortable physical reaction when alcohol is used (aversion therapy).  Support groups. Support groups are run by people who have quit drinking. They provide emotional support, advice, and guidance. These forms of treatment are often combined. Some people with alcohol use disorder benefit from intensive combination treatment provided by specialized substance use treatment centers. Both inpatient and outpatient treatment programs are available. Document Released: 09/30/2004 Document Revised: 01/07/2014 Document Reviewed: 11/30/2012 [facility] Patient Information 2015 [place], [place]. This information is not intended to replace advice given to you by your health care provider. Make sure you discuss any questions you have with your health care provider.  Cocaine Cocaine stimulates the central nervous system. As a stimulant, cocaine has the ability to improve athletic performance through increasing speed, endurance, and concentration, as well as decreasing fatigue. Although cocaine may seem to be beneficial for athletics, it is highly addicting and has many debilitating side effects. Cocaine has caused the deaths of many athletes, and its use is banned by every major athletic organization in the world.  The clinical effect of cocaine (the high) is very short in duration. Cocaine works in the brain by altering the normal concentrations of chemicals that stimulate the brain cells.  WHY ATHLETES USE IT  Many athletes use cocaine for its central nervous system stimulating properties. It is also used as a recreational drug due to the euphoric felling it produces.  ADVERSE EFFECTS   Sleep disturbances.  Abnormal heart rhythms.  Stroke.  Heart attack.  Seizures.  Elevated blood pressure.  Death.  Paranoia (feeling that people want to hurt you).  Panic attacks (sudden feelings of anxiety or shortness of breath).  Suicidal behavior (wanting to kill yourself).  Homicidal behavior (wanting to kill other people).  Depression (feeling very sad, having decreased energy for activities).  Poor athletic performance. PHARMACOLOGY  Cocaine acts on the body for a short period of time; the clinical effects may last less than1 hour. Since most athletic competitions last for more than 1 hour, cocaine use may not improve athletic performance. The use of cocaine makes individuals much more susceptible for serious conditions such as seizures, arrhythmia (irregular heart beat), and strokes. Even a single dose of cocaine can be detected on a drug test for up to about 30 hours.  PREVENTION Most athletes use cocaine as a recreational drug and not for the purpose of enhancing athletic performance. To prevent the use of cocaine, athletes must be educated on its side effects and the risk of addiction. If an athlete is found using cocaine, counseling and treatment are almost always required.  Document Released: 08/23/2005 Document Revised: 11/15/2011 Document Reviewed: 12/05/2008 [facility] Patient Information 2015 [place], [place]. This information is not intended to replace  advice given to you by your health care provider. Make sure you discuss any questions you have with your health care provider.  Concussion A  concussion, or closed-head injury, is a brain injury caused by a direct blow to the head or by a quick and sudden movement (jolt) of the head or neck. Concussions are usually not life-threatening. Even so, the effects of a concussion can be serious. If you have had a concussion before, you are more likely to experience concussion-like symptoms after a direct blow to the head.  CAUSES  Direct blow to the head, such as from running into another player during a soccer game, being hit in a fight, or hitting your head on a hard surface.  A jolt of the head or neck that causes the brain to move back and forth inside the skull, such as in a car crash. SIGNS AND SYMPTOMS The signs of a concussion can be hard to notice. Early on, they may be missed by you, family members, and health care providers. You may look fine but act or feel differently. Symptoms are usually temporary, but they may last for days, weeks, or even longer. Some symptoms may appear right away while others may not show up for hours or days. Every head injury is different. Symptoms include:  Mild to moderate headaches that will not go away.  A feeling of pressure inside your head.  Having more trouble than usual:  Learning or remembering things you have heard.  Answering questions.  Paying attention or concentrating.  Organizing daily tasks.  Making decisions and solving problems.  Slowness in thinking, acting or reacting, speaking, or reading.  Getting lost or being easily confused.  Feeling tired all the time or lacking energy (fatigued).  Feeling drowsy.  Sleep disturbances.  Sleeping more than usual.  Sleeping less than usual.  Trouble falling asleep.  Trouble sleeping (insomnia).  Loss of balance or feeling lightheaded or dizzy.  Nausea or vomiting.  Numbness or tingling.  Increased sensitivity to:  Sounds.  Lights.  Distractions.  Vision problems or eyes that tire easily.  Diminished sense of  taste or smell.  Ringing in the ears.  Mood changes such as feeling sad or anxious.  Becoming easily irritated or angry for little or no reason.  Lack of motivation.  Seeing or hearing things other people do not see or hear (hallucinations). DIAGNOSIS Your health care provider can usually diagnose a concussion based on a description of your injury and symptoms. He or she will ask whether you passed out (lost consciousness) and whether you are having trouble remembering events that happened right before and during your injury. Your evaluation might include:  A brain scan to look for signs of injury to the brain. Even if the test shows no injury, you may still have a concussion.  Blood tests to be sure other problems are not present. TREATMENT  Concussions are usually treated in an emergency department, in urgent care, or at a clinic. You may need to stay in the hospital overnight for further treatment.  Tell your health care provider if you are taking any medicines, including prescription medicines, over-the-counter medicines, and natural remedies. Some medicines, such as blood thinners (anticoagulants) and aspirin, may increase the chance of complications. Also tell your health care provider whether you have had alcohol or are taking illegal drugs. This information may affect treatment.  Your health care provider will send you home with important instructions to follow.  How fast you will  recover from a concussion depends on many factors. These factors include how severe your concussion is, what part of your brain was injured, your age, and how healthy you were before the concussion.  Most people with mild injuries recover fully. Recovery can take time. In general, recovery is slower in older persons. Also, persons who have had a concussion in the past or have other medical problems may find that it takes longer to recover from their current injury. HOME CARE INSTRUCTIONS General  Instructions  Carefully follow the directions your health care provider gave you.  Only take over-the-counter or prescription medicines for pain, discomfort, or fever as directed by your health care provider.  Take only those medicines that your health care provider has approved.  Do not drink alcohol until your health care provider says you are well enough to do so. Alcohol and certain other drugs may slow your recovery and can put you at risk of further injury.  If it is harder than usual to remember things, write them down.  If you are easily distracted, try to do one thing at a time. For example, do not try to watch TV while fixing dinner.  Talk with family members or close friends when making important decisions.  Keep all follow-up appointments. Repeated evaluation of your symptoms is recommended for your recovery.  Watch your symptoms and tell others to do the same. Complications sometimes occur after a concussion. Older adults with a brain injury may have a higher risk of serious complications, such as a blood clot on the brain.  Tell your teachers, school nurse, school counselor, coach, athletic trainer, or work Freight forwarder about your injury, symptoms, and restrictions. Tell them about what you can or cannot do. They should watch for:  Increased problems with attention or concentration.  Increased difficulty remembering or learning new information.  Increased time needed to complete tasks or assignments.  Increased irritability or decreased ability to cope with stress.  Increased symptoms.  Rest. Rest helps the brain to heal. Make sure you:  Get plenty of sleep at night. Avoid staying up late at night.  Keep the same bedtime hours on weekends and weekdays.  Rest during the day. Take daytime naps or rest breaks when you feel tired.  Limit activities that require a lot of thought or concentration. These include:  Doing homework or job-related work.  Watching  TV.  Working on the computer.  Avoid any situation where there is potential for another head injury (football, hockey, soccer, basketball, martial arts, downhill snow sports and horseback riding). Your condition will get worse every time you experience a concussion. You should avoid these activities until you are evaluated by the appropriate follow-up health care providers. Returning To Your Regular Activities You will need to return to your normal activities slowly, not all at once. You must give your body and brain enough time for recovery.  Do not return to sports or other athletic activities until your health care provider tells you it is safe to do so.  Ask your health care provider when you can drive, ride a bicycle, or operate heavy machinery. Your ability to react may be slower after a brain injury. Never do these activities if you are dizzy.  Ask your health care provider about when you can return to work or school. Preventing Another Concussion It is very important to avoid another brain injury, especially before you have recovered. In rare cases, another injury can lead to permanent brain damage, brain swelling,  or death. The risk of this is greatest during the first 7-10 days after a head injury. Avoid injuries by:  Wearing a seat belt when riding in a car.  Drinking alcohol only in moderation.  Wearing a helmet when biking, skiing, skateboarding, skating, or doing similar activities.  Avoiding activities that could lead to a second concussion, such as contact or recreational sports, until your health care provider says it is okay.  Taking safety measures in your home.  Remove clutter and tripping hazards from floors and stairways.  Use grab bars in bathrooms and handrails by stairs.  Place non-slip mats on floors and in bathtubs.  Improve lighting in dim areas. SEEK MEDICAL CARE IF:  You have increased problems paying attention or concentrating.  You have increased  difficulty remembering or learning new information.  You need more time to complete tasks or assignments than before.  You have increased irritability or decreased ability to cope with stress.  You have more symptoms than before. Seek medical care if you have any of the following symptoms for more than 2 weeks after your injury:  Lasting (chronic) headaches.  Dizziness or balance problems.  Nausea.  Vision problems.  Increased sensitivity to noise or light.  Depression or mood swings.  Anxiety or irritability.  Memory problems.  Difficulty concentrating or paying attention.  Sleep problems.  Feeling tired all the time. SEEK IMMEDIATE MEDICAL CARE IF:  You have severe or worsening headaches. These may be a sign of a blood clot in the brain.  You have weakness (even if only in one hand, leg, or part of the face).  You have numbness.  You have decreased coordination.  You vomit repeatedly.  You have increased sleepiness.  One pupil is larger than the other.  You have convulsions.  You have slurred speech.  You have increased confusion. This may be a sign of a blood clot in the brain.  You have increased restlessness, agitation, or irritability.  You are unable to recognize people or places.  You have neck pain.  It is difficult to wake you up.  You have unusual behavior changes.  You lose consciousness. MAKE SURE YOU:  Understand these instructions.  Will watch your condition.  Will get help right away if you are not doing well or get worse. Document Released: 11/13/2003 Document Revised: 08/28/2013 Document Reviewed: 03/15/2013 [facility] Patient Information 2015 [place], [place]. This information is not intended to replace advice given to you by your health care provider. Make sure you discuss any questions you have with your health care provider.  General Headache Without Cause A headache is pain or discomfort felt around the head or neck area.  The specific cause of a headache may not be found. There are many causes and types of headaches. A few common ones are:  Tension headaches.  Migraine headaches.  Cluster headaches.  Chronic daily headaches. HOME CARE INSTRUCTIONS   Keep all follow-up appointments with your caregiver or any specialist referral.  Only take over-the-counter or prescription medicines for pain or discomfort as directed by your caregiver.  Lie down in a dark, quiet room when you have a headache.  Keep a headache journal to find out what may trigger your migraine headaches. For example, write down:  What you eat and drink.  How much sleep you get.  Any change to your diet or medicines.  Try massage or other relaxation techniques.  Put ice packs or heat on the head and neck. Use these 3  to 4 times per day for 15 to 20 minutes each time, or as needed.  Limit stress.  Sit up straight, and do not tense your muscles.  Quit smoking if you smoke.  Limit alcohol use.  Decrease the amount of caffeine you drink, or stop drinking caffeine.  Eat and sleep on a regular schedule.  Get 7 to 9 hours of sleep, or as recommended by your caregiver.  Keep lights dim if bright lights bother you and make your headaches worse. SEEK MEDICAL CARE IF:   You have problems with the medicines you were prescribed.  Your medicines are not working.  You have a change from the usual headache.  You have nausea or vomiting. SEEK IMMEDIATE MEDICAL CARE IF:   Your headache becomes severe.  You have a fever.  You have a stiff neck.  You have loss of vision.  You have muscular weakness or loss of muscle control.  You start losing your balance or have trouble walking.  You feel faint or pass out.  You have severe symptoms that are different from your first symptoms. MAKE SURE YOU:   Understand these instructions.  Will watch your condition.  Will get help right away if you are not doing well or get  worse. Document Released: 08/23/2005 Document Revised: 11/15/2011 Document Reviewed: 09/08/2011 [facility] Patient Information 2015 [place], [place]. This information is not intended to replace advice given to you by your health care provider. Make sure you discuss any questions you have with your health care provider.  Polysubstance Abuse When people abuse more than one drug or type of drug it is called polysubstance or polydrug abuse. For example, many smokers also drink alcohol. This is one form of polydrug abuse. Polydrug abuse also refers to the use of a drug to counteract an unpleasant effect produced by another drug. It may also be used to help with withdrawal from another drug. People who take stimulants may become agitated. Sometimes this agitation is countered with a tranquilizer. This helps protect against the unpleasant side effects. Polydrug abuse also refers to the use of different drugs at the same time.  Anytime drug use is interfering with normal living activities, it has become abuse. This includes problems with family and friends. Psychological dependence has developed when your mind tells you that the drug is needed. This is usually followed by physical dependence which has developed when continuing increases of drug are required to get the same feeling or "high". This is known as addiction or chemical dependency. A person's risk is much higher if there is a history of chemical dependency in the family. SIGNS OF CHEMICAL DEPENDENCY  You have been told by friends or family that drugs have become a problem.  You fight when using drugs.  You are having blackouts (not remembering what you do while using).  You feel sick from using drugs but continue using.  You lie about use or amounts of drugs (chemicals) used.  You need chemicals to get you going.  You are suffering in work performance or in school because of drug use.  You get sick from use of drugs but continue to use  anyway.  You need drugs to relate to people or feel comfortable in social situations.  You use drugs to forget problems. "Yes" answered to any of the above signs of chemical dependency indicates there are problems. The longer the use of drugs continues, the greater the problems will become. If there is a family history of drug  or alcohol use, it is best not to experiment with these drugs. Continual use leads to tolerance. After tolerance develops more of the drug is needed to get the same feeling. This is followed by addiction. With addiction, drugs become the most important part of life. It becomes more important to take drugs than participate in the other usual activities of life. This includes relating to friends and family. Addiction is followed by dependency. Dependency is a condition where drugs are now needed not just to get high, but to feel normal. Addiction cannot be cured but it can be stopped. This often requires outside help and the care of professionals. Treatment centers are listed in the yellow pages under: Cocaine, Narcotics, and Alcoholics Anonymous. Most hospitals and clinics can refer you to a specialized care center. Talk to your caregiver if you need help. Document Released: 04/14/2005 Document Revised: 11/15/2011 Document Reviewed: 08/23/2005 [facility] Patient Information 2015 [place], [place]. This information is not intended to replace advice given to you by your health care provider. Make sure you discuss any questions you have with your health care provider.  Post-Concussion Syndrome Post-concussion syndrome describes the symptoms that can occur after a head injury. These symptoms can last from weeks to months. CAUSES  It is not clear why some head injuries cause post-concussion syndrome. It can occur whether your head injury was mild or severe and whether you were wearing head protection or not.  SIGNS AND SYMPTOMS  Memory difficulties.  Dizziness.  Headaches.  Double  vision or blurry vision.  Sensitivity to light.  Hearing difficulties.  Depression.  Tiredness.  Weakness.  Difficulty with concentration.  Difficulty sleeping or staying asleep.  Vomiting.  Poor balance or instability on your feet.  Slow reaction time.  Difficulty learning and remembering things you have heard. DIAGNOSIS  There is no test to determine whether you have post-concussion syndrome. Your health care provider may order an imaging scan of your brain, such as a CT scan, to check for other problems that may be causing your symptoms (such as severe injury inside your skull). TREATMENT  Usually, these problems disappear over time without medical care. Your health care provider may prescribe medicine to help ease your symptoms. It is important to follow up with a neurologist to evaluate your recovery and address any lingering symptoms or issues. HOME CARE INSTRUCTIONS   Only take over-the-counter or prescription medicines for pain, discomfort, or fever as directed by your health care provider. Do not take aspirin. Aspirin can slow blood clotting.  Sleep with your head slightly elevated to help with headaches.  Avoid any situation where there is potential for another head injury (football, hockey, soccer, basketball, martial arts, downhill snow sports, and horseback riding). Your condition will get worse every time you experience a concussion. You should avoid these activities until you are evaluated by the appropriate follow-up health care providers.  Keep all follow-up appointments as directed by your health care provider. SEEK IMMEDIATE MEDICAL CARE IF:  You develop confusion or unusual drowsiness.  You cannot wake the injured person.  You develop nausea or persistent, forceful vomiting.  You feel like you are moving when you are not (vertigo).  You notice the injured person's eyes moving rapidly back and forth. This may be a sign of vertigo.  You have  convulsions or faint.  You have severe, persistent headaches that are not relieved by medicine.  You cannot use your arms or legs normally.  Your pupils change size.  You have  clear or bloody discharge from the nose or ears.  Your problems are getting worse, not better. MAKE SURE YOU:  Understand these instructions.  Will watch your condition.  Will get help right away if you are not doing well or get worse. Document Released: 02/12/2002 Document Revised: 06/13/2013 Document Reviewed: 11/28/2013 [facility] Patient Information 2015 [place], [place]. This information is not intended to replace advice given to you by your health care provider. Make sure you discuss any questions you have with your health care provider.   Behavioral Health Resources in the [facility]  Intensive Outpatient Programs: [facility]      [place]. [place], [place] Both a day and evening program       [facility] Out[facility]     [address]        [place], [place] [address] [phone]         ADS: Alcohol [facility] [employer] [place]: [phone] or [phone] [address] [place], [place] [address] [email or web address]  Mobile Crisis Teams:                                        [facility]         Mobile Crisis Care Unit [phone]             [facility] [address] Lady Gary Moran [name] [place] [phone]  Self-Help/Support Groups: [facility] [employer] of support groups [phone] (call for more info)  Narcotics Anonymous (NA) [facility] [address] [place] - 2 meetings at this location  Residential Treatment Programs:   [place]       [place] [address] [place], [place]  [address] [place]  [address] [place], [place] [address] [phone] Admissions: 8am-3pm M-F  [facility] [place]     [address],  [address]       [phone]         [facility] [address] Jadene Pierini [place], [place]  [facility] [address] [place], [place]  [facility] - Intensive Outpatient      [address]     [place], [place]         [facility] ([place]     [address] [place], [place] or [phone]  [facility] ([place])  [place], [place]  [facility] [place]  [place]: [place]- [phone]               General Therapy                                                Domenic Schwab, PhD        [address] [place], [place] [address]         [facility]   [address] [place], [place] [address] [phone]  [facility] [address] [place], [place] [address] [phone] Insurance/Medicaid/sponsorship through [facility]                                              [address] [place]                                        [place], [place] [address]    Therapy/tele-psych/case         [place] [address], [place]  [address]  Adolescent/group home/case management [phone]                                           Rosette Reveal PhD       General  therapy       Insurance   [phone]         Dr. Adele Schilder Insurance [phone] M-F  [facility] Medicaid, sponsorship [phone]
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
## 2014-07-15 NOTE — ED Notes (Signed)
Given sandwich & snack. Dr. Sharol Given in to see pt.

## 2014-07-24 ENCOUNTER — Telehealth: Payer: Self-pay | Admitting: *Deleted

## 2014-07-24 NOTE — Telephone Encounter (Signed)
Left patient a voice mail to return my call. I scheduled his appointment for elastography ultrasound for 08/20/14 at 8:45 AM at [facility]. NPO after midnight. He also needs to make a lab appointment for the Hep C screenings that Dr. Johnnye Sima ordered. Regarding his referral to [facility]; they do not take the orange discount card. They will see him for $85.00 and then he can apply for financial assistance and they do have some charity care; but there is the one time payment.

## 2014-07-30 NOTE — Telephone Encounter (Signed)
Patient notified of elastography appointment. He will come for lab work when he can and wants to wait on the referral to Neuro.

## 2014-08-05 ENCOUNTER — Telehealth: Payer: Self-pay | Admitting: *Deleted

## 2014-08-05 NOTE — Telephone Encounter (Signed)
Ordered Leib's Viagra today.  Should receive within next 7-10 days.

## 2014-08-06 ENCOUNTER — Other Ambulatory Visit: Payer: Self-pay | Admitting: Internal Medicine

## 2014-08-06 DIAGNOSIS — I10 Essential (primary) hypertension: Secondary | ICD-10-CM

## 2014-08-20 ENCOUNTER — Ambulatory Visit (HOSPITAL_COMMUNITY): Payer: Self-pay

## 2014-08-24 ENCOUNTER — Encounter (HOSPITAL_COMMUNITY): Payer: Self-pay | Admitting: Emergency Medicine

## 2014-08-24 ENCOUNTER — Emergency Department (HOSPITAL_COMMUNITY)
Admission: EM | Admit: 2014-08-24 | Discharge: 2014-08-24 | Disposition: A | Discharge status: Home / Self Care | Payer: Self-pay | Attending: Emergency Medicine | Admitting: Emergency Medicine

## 2014-08-24 DIAGNOSIS — I10 Essential (primary) hypertension: Secondary | ICD-10-CM | POA: Insufficient documentation

## 2014-08-24 DIAGNOSIS — R519 Headache, unspecified: Secondary | ICD-10-CM

## 2014-08-24 DIAGNOSIS — Z79899 Other long term (current) drug therapy: Secondary | ICD-10-CM | POA: Insufficient documentation

## 2014-08-24 DIAGNOSIS — G8929 Other chronic pain: Secondary | ICD-10-CM

## 2014-08-24 DIAGNOSIS — H5500 Unspecified nystagmus: Secondary | ICD-10-CM | POA: Insufficient documentation

## 2014-08-24 DIAGNOSIS — Z720 Tobacco use: Secondary | ICD-10-CM | POA: Insufficient documentation

## 2014-08-24 DIAGNOSIS — R51 Headache: Secondary | ICD-10-CM | POA: Insufficient documentation

## 2014-08-24 DIAGNOSIS — G40909 Epilepsy, unspecified, not intractable, without status epilepticus: Secondary | ICD-10-CM | POA: Insufficient documentation

## 2014-08-24 DIAGNOSIS — Z21 Asymptomatic human immunodeficiency virus [HIV] infection status: Secondary | ICD-10-CM | POA: Insufficient documentation

## 2014-08-24 DIAGNOSIS — F1010 Alcohol abuse, uncomplicated: Secondary | ICD-10-CM | POA: Insufficient documentation

## 2014-08-24 MED ORDER — LORAZEPAM 1 MG PO TABS
0.0000 mg | ORAL_TABLET | Freq: Two times a day (BID) | ORAL | Status: DC
Start: 2014-08-24 — End: 2014-08-24
  Filled 2014-08-24: qty 1

## 2014-08-24 MED ORDER — LORAZEPAM 2 MG/ML IJ SOLN: 0.0000 mg | Freq: Four times a day (QID) | INTRAMUSCULAR | Status: DC

## 2014-08-24 MED ORDER — VITAMIN B-1 100 MG PO TABS: 100.0000 mg | ORAL_TABLET | Freq: Every day | ORAL | Status: DC

## 2014-08-24 MED ORDER — LORAZEPAM 2 MG/ML IJ SOLN: 0.0000 mg | Freq: Two times a day (BID) | INTRAMUSCULAR | Status: DC

## 2014-08-24 MED ORDER — THIAMINE HCL 100 MG/ML IJ SOLN: 100.0000 mg | Freq: Every day | INTRAMUSCULAR | Status: DC

## 2014-08-24 MED ORDER — LORAZEPAM 1 MG PO TABS
0.0000 mg | ORAL_TABLET | Freq: Four times a day (QID) | ORAL | Status: DC
  Administered 2014-08-24: 1 mg via ORAL

## 2014-08-24 NOTE — ED Notes (Signed)
Patient called EMS and says he is having seizures. EMS did not witness a seizure.

## 2014-08-24 NOTE — ED Provider Notes (Signed)
CSN: [id number]     Arrival date & time 08/24/14  0454 History   First MD Initiated Contact with Patient 08/24/14 [phone]     Chief Complaint  Patient presents with  . Seizures  . Alcohol Intoxication     (Consider location/radiation/quality/duration/timing/severity/associated sxs/prior Treatment) HPI Comments: Isaac [name] is a 53 y.o. male with a PMHx of HTN, HIV, seizures, and polysubstance abuse (EtOH and cocaine), who presents to the ED with complaints of EtOH intoxication and cocaine use requesting assistance with his substance abuse. L5 Caveat- Pt is intoxicated therefore history is limited. Pt states he drank 2 fifths of liquor and used cocaine yesterday, last drink of alcohol occurring around 3:30 am. States he "thinks (he) had a seizure" which prompted him to call EMS, who reported not seeing any seizures like activity upon arrival. Pt states he's had seizures before with alcohol use/withdrawal. States at this moment he has a headache which is consistent with his prior headaches, gradual onset 8/10 throbbing frontal nonradiating worse with lights and with no known alleviating factors since he has not tried anything for his headache. States he feels dizzy which is typical for his alcohol use. he states he wants help with his substance abuse, but he states he didn't follow up with the resources given on his last visit. Denies SI/HI/AVH, fevers, chills, CP, SOB, abd pain, N/V/D/C, hematuria, dysuria, myalgias, arthralgias, vision changes, weakness, paresthesias, numbness, syncope, or rashes.   Patient is a 53 y.o. male presenting with intoxication. The history is provided by the patient. The history is limited by the condition of the patient. No language interpreter was used.  Alcohol Intoxication This is a chronic problem. The current episode started today. The problem occurs constantly. The problem has been unchanged. Associated symptoms include headaches. Pertinent negatives include no  abdominal pain, arthralgias, change in bowel habit, chest pain, chills, fever, myalgias, nausea, numbness, rash, vertigo, visual change, vomiting or weakness. The symptoms are aggravated by drinking. He has tried nothing for the symptoms. The treatment provided no relief.    Past Medical History  Diagnosis Date  . Hypertension   . HIV (human immunodeficiency virus infection)   . Seizures    History reviewed. No pertinent past surgical history. History reviewed. No pertinent family history. History  Substance Use Topics  . Smoking status: Current Every Day Smoker -- 0.50 packs/day for 35 years    Types: Cigarettes    Start date: 10/28/2011  . Smokeless tobacco: Never Used  . Alcohol Use: 0.0 oz/week     Comment: occasional    Review of Systems  Unable to perform ROS: Other  Constitutional: Negative for fever and chills.  Eyes: Negative for visual disturbance.  Respiratory: Negative for shortness of breath.   Cardiovascular: Negative for chest pain.  Gastrointestinal: Negative for nausea, vomiting, abdominal pain, diarrhea, constipation and change in bowel habit.  Genitourinary: Negative for dysuria and hematuria.  Musculoskeletal: Negative for myalgias, back pain and arthralgias.  Skin: Negative for rash.  Neurological: Positive for dizziness, seizures (self-reported, unwitnessed by EMS personnel) and headaches. Negative for vertigo, syncope, weakness, light-headedness and numbness.  Psychiatric/Behavioral: Negative for suicidal ideas, hallucinations and self-injury.       +intoxication   10 Systems reviewed and are negative for acute change except as noted in the HPI.    Allergies  Review of patient's allergies indicates no known allergies.  Home Medications   Prior to Admission medications   Medication Sig Start Date End Date Taking? Authorizing Provider  efavirenz-emtricitabine-tenofovir (ATRIPLA) 600-200-300 MG per tablet TAKE 1 TABLET BY MOUTH DAILY 06/17/14  Yes  Campbell Riches, MD  levETIRAcetam (KEPPRA) 500 MG tablet TAKE 1 TABLET BY MOUTH TWICE DAILY 06/21/14  Yes Campbell Riches, MD  lisinopril-hydrochlorothiazide (PRINZIDE,ZESTORETIC) 10-12.5 MG per tablet TAKE 1 TABLET BY MOUTH DAILY 08/06/14  Yes Campbell Riches, MD  butalbital-acetaminophen-caffeine (FIORICET) [phone] MG per tablet Take 1-2 tablets by mouth every 6 (six) hours as needed for headache. 07/15/14 07/15/15  Kalman Drape, MD  sildenafil (VIAGRA) 100 MG tablet Take 1 tablet (100 mg total) by mouth daily as needed. 06/17/14   Campbell Riches, MD   BP 136/81 mmHg  Pulse 95  Temp(Src) 98.3 F (36.8 C) (Oral)  Resp 18  SpO2 99% Physical Exam  Constitutional: He is oriented to person, place, and time. Vital signs are normal. He appears well-developed and well-nourished.  Non-toxic appearance. No distress.  Afebrile, nontoxic, sleeping but arouseable, somewhat intoxicated but goal-directed speech  HENT:  Head: Normocephalic and atraumatic.  Mouth/Throat: Mucous membranes are normal.  Eyes: Conjunctivae are normal. Pupils are equal, round, and reactive to light. Right eye exhibits no discharge. Left eye exhibits no discharge. Right eye exhibits nystagmus. Left eye exhibits nystagmus.  PERRL, EOMI although pt somewhat uncooperative due to intoxication, slight horizontal nystagmus  Neck: Normal range of motion. Neck supple.  Cardiovascular: Normal rate, regular rhythm, normal heart sounds and intact distal pulses.  Exam reveals no gallop and no friction rub.   No murmur heard. Pulmonary/Chest: Effort normal and breath sounds normal. No respiratory distress. He has no decreased breath sounds. He has no wheezes. He has no rhonchi. He has no rales.  Abdominal: Soft. Normal appearance and bowel sounds are normal. He exhibits no distension. There is no tenderness. There is no rigidity, no rebound, no guarding, no tenderness at McBurney's point and negative Murphy's sign.  Musculoskeletal:  Normal range of motion.  MAE x4 Able to turn himself over in bed unassisted Unable to ambulate due to pt being uncooperative Strength 5/5 in all extremities Sensation grossly intact in all extremities  Neurological: He is alert and oriented to person, place, and time. He has normal strength. No sensory deficit.  Uncooperative with full neuro exam due to intoxication No seizure-like activity noted  Skin: Skin is warm, dry and intact. No rash noted.  Psychiatric: He has a normal mood and affect. His speech is slurred. He is not actively hallucinating. He expresses no homicidal and no suicidal ideation. He expresses no suicidal plans and no homicidal plans.  Speech slightly slurred, appears slightly intoxicated  Nursing note and vitals reviewed.   ED Course  Procedures (including critical care time) Labs Review Labs Reviewed - No data to display  Imaging Review No results found.   EKG Interpretation None      MDM   Final diagnoses:  Alcohol abuse  Chronic nonintractable headache, unspecified headache type    54 y.o. male here with EtOH and cocaine use requesting "help". Has been seen for same previously and did not follow up with resources given. Denies SI/HI/AVH. States he has had seizures before related to EtOH use. Pt somewhat uncooperative with exam due to intoxication. Will allow pt to sober up prior to reassessment. Doubt need for CT head, states his HA is similar to prior headaches. Will reassess when sober. CIWA in place.  10:43 AM Pt more awake and alert, wants detox. Discussed outpatient resources for this. No seizures here, and pt stable  and clinically sober. Will d/c home with resource guide. I explained the diagnosis and have given explicit precautions to return to the ER including for any other new or worsening symptoms. The patient understands and accepts the medical plan as it's been dictated and I have answered their questions. Discharge instructions concerning  home care and prescriptions have been given. The patient is STABLE and is discharged to home in good condition.  BP 138/92 mmHg  Pulse 105  Temp(Src) 99.5 F (37.5 C) (Oral)  Resp 20  SpO2 95%  Meds ordered this encounter  Medications  . LORazepam (ATIVAN) tablet 0-4 mg    Sig:     Order Specific Question:  CIWA-AR < 5 =    Answer:  0 mg    Order Specific Question:  CIWA-AR 5 -10 =    Answer:  1 mg    Order Specific Question:  CIWA-AR 11 -15 =    Answer:  2 mg    Order Specific Question:  CIWA-AR 16 -24 =    Answer:  2 mg    Order Specific Question:  CIWA-AR 16 -24 =    Answer:  Recheck CIWA-AR in 1 hour; if > 15 notify MD    Order Specific Question:  CIWA-AR > 24 =    Answer:  4 mg    Order Specific Question:  CIWA-AR > 24 =    Answer:  Call Rapid Response  . LORazepam (ATIVAN) tablet 0-4 mg    Sig:     Order Specific Question:  CIWA-AR < 5 =    Answer:  0 mg    Order Specific Question:  CIWA-AR 5 -10 =    Answer:  1 mg    Order Specific Question:  CIWA-AR 11 -15 =    Answer:  2 mg    Order Specific Question:  CIWA-AR 16 -24 =    Answer:  2 mg    Order Specific Question:  CIWA-AR 16 -24 =    Answer:  Recheck CIWA-AR in 1 hour; if > 15 notify MD    Order Specific Question:  CIWA-AR > 24 =    Answer:  4 mg    Order Specific Question:  CIWA-AR > 24 =    Answer:  Call Rapid Response  . LORazepam (ATIVAN) injection 0-4 mg    Sig:     Order Specific Question:  CIWA-AR < 5 =    Answer:  0 mg    Order Specific Question:  CIWA-AR 5 -10 =    Answer:  1 mg    Order Specific Question:  CIWA-AR 11 -15 =    Answer:  2 mg    Order Specific Question:  CIWA-AR 16 -24 =    Answer:  2 mg    Order Specific Question:  CIWA-AR 16 -24 =    Answer:  Recheck CIWA-AR in 1 hour; if > 15 notify MD    Order Specific Question:  CIWA-AR > 24 =    Answer:  4 mg    Order Specific Question:  CIWA-AR > 24 =    Answer:  Call Rapid Response  . LORazepam (ATIVAN) injection 0-4 mg    Sig:      Order Specific Question:  CIWA-AR < 5 =    Answer:  0 mg    Order Specific Question:  CIWA-AR 5 -10 =    Answer:  1 mg    Order Specific Question:  CIWA-AR 11 -15 =  Answer:  2 mg    Order Specific Question:  CIWA-AR 16 -24 =    Answer:  2 mg    Order Specific Question:  CIWA-AR 16 -24 =    Answer:  Recheck CIWA-AR in 1 hour; if > 15 notify MD    Order Specific Question:  CIWA-AR > 24 =    Answer:  4 mg    Order Specific Question:  CIWA-AR > 24 =    Answer:  Call Rapid Response  . thiamine (VITAMIN B-1) tablet 100 mg    Sig:   . thiamine (B-1) injection 100 mg    Sig:      Patty Sermons [place], PA-C 08/24/14 1044  Varney Biles, MD 08/25/14 [phone]

## 2014-08-24 NOTE — Discharge Instructions (Signed)
Use the list below to find a substance abuse specialist to help with your substance abuse. Stop using alcohol and cocaine. Take your regular home medications. Follow up with your regular doctor for any ongoing medical needs. Return to the ER for changes or worsening in your medical status.   Emergency Department Resource Guide 1) Find a Doctor and Pay Out of Pocket Although you won't have to find out who is covered by your insurance plan, it is a good idea to ask around and get recommendations. You will then need to call the office and see if the doctor you have chosen will accept you as a new patient and what types of options they offer for patients who are self-pay. Some doctors offer discounts or will set up payment plans for their patients who do not have insurance, but you will need to ask so you aren't surprised when you get to your appointment.  2) Contact Your Local Health Department Not all health departments have doctors that can see patients for sick visits, but many do, so it is worth a call to see if yours does. If you don't know where your local health department is, you can check in your phone book. The CDC also has a tool to help you locate your state's health department, and many state websites also have listings of all of their local health departments.  3) Find a [facility] If your illness is not likely to be very severe or complicated, you may want to try a walk in clinic. These are popping up all over the country in pharmacies, drugstores, and shopping centers. They're usually staffed by nurse practitioners or physician assistants that have been trained to treat common illnesses and complaints. They're usually fairly quick and inexpensive. However, if you have serious medical issues or chronic medical problems, these are probably not your best option.  No Primary Care Doctor: - Call Health Connect at  [phone] - they can help you locate a primary care doctor that  accepts your  insurance, provides certain services, etc. - Physician Referral Service- [phone]  Chronic Pain Problems: Organization         Address  Phone   Notes  [facility]  [phone] Patients need to be referred by their primary care doctor.   Medication Assistance: Organization         Address  Phone   Notes  [facility] Medication [facility] [address] [place], [place] [address] [phone] --Must be a resident of [facility] -- Must have NO insurance coverage whatsoever (no Medicaid/ Medicare, etc.) -- The pt. MUST have a primary care doctor that directs their care regularly and follows them in the community   MedAssist  [phone]   [employer]  [phone]    [facility]: Intensive Outpatient Programs Organization         Address  Phone  Notes  [place]. [address] [place], [place] [phone]   [facility] Outpatient [address] [place], [place]   ADS: Alcohol & Drug Svcs [address], [place], [place]   [place] [address]  [place], [place] or [phone]   Substance Abuse Resources Organization         Address  Phone  Notes  Alcohol and Drug Services  [place]  [phone]  [facility] [place]   Chinita Pester  [phone]   Residential & Outpatient Substance Abuse Program  [phone]   Psychological Services Organization         Address  Phone  Notes  [facility] [place]  [place]  [phone]   [place] [address] [place] or [phone]    Mobile Crisis Teams Organization         Address  Phone  Notes  [facility], Mobile Crisis Care Unit  [phone]   [facility]ive Psychotherapeutic Services  [address]  [place], [place]   [place] [address], [place] [phone]    Self-Help/Support Groups Organization         Address  Phone             Notes  [place]. of [place] - variety of support groups  [place] Call for more information  Narcotics Anonymous (NA), [facility] [address], [employer] [place]  2 meetings at this location   Special educational needs teacher         Address  Phone  Notes  [facility] Residential Treatment [place],    [place]  [phone]   [facility]  [address] [place] [address], [place], [place]   [place], [place] [phone] Admissions: 8am-3pm M-F  Incentives Substance [place] [address],    [place], [place] [phone]   [facility] [address] [place], [place], [place]   [facility] [address],  [place], [place]   [facility] - Intensive Outpatient [address] Kristeen Mans 21, [place], [place]   [facility] ([place] [place].,  [place], [place] [phone] or [phone]   [facility] Services (RTS) [address] [place], [place] Accepts Medicaid  [facility] [place] [address],  [place] [phone] Substance Abuse/Addiction Treatment   [facility] Organization         Address  Phone  Notes  [facility]  [phone]   Domenic Schwab, PhD [address] Arlis [place], [place]   [phone] or [phone]   [place] McKinney [name], [place] [phone]   [facility] [address], [place], [place] [phone] Insurance/Medicaid/sponsorship through [facility] and Families [address], [address]                                    [place], [place] [phone] [place] [address], [place] [phone]    Dr. Adele Schilder  [phone]   [facility] [place] Dept. 1) [address] [place] 2) [place] 3)  [place] [address], [place] [phone]  [phone]   [place] [phone] or [phone] (After Hours)      [place] in the [name]  Intensive Outpatient Programs: [facility]      [place]. [place], [place] Both a day and evening program       [facility]  Outpatient     [place], [place] [address] [phone]         ADS: Alcohol & Drug Svcs [place]: [phone] or [phone] [address], [place] [address] [email or web address]  Mobile Crisis Teams:                                        [facility]         Mobile Crisis Care Unit [phone]             Assertive Psychotherapeutic Services [address] Lady Gary [place] [address] [place] [phone]  Self-Help/Support Groups: Mental Health Assoc. of [employer] of support groups [phone] (call for more info)  Narcotics Anonymous (NA) [facility] [address]elia [place] - 2 meetings at this location  Residential Treatment Programs:  [place]       [place] [address] [place], [place]  [address] [place]  [address] [place], [place] [address] [phone] Admissions: 8am-3pm M-F  Incentives Substance  [place]     [address] [place],  [address]       [phone]         [facility] [place] [address] Jadene Pierini [place], [place]  [facility] [address] [place], [place]  [facility] - Intensive Outpatient      [address] [place] 010     [place], [place]         [facility] ([place]     [place], [place] or [phone]  Residential Treatment Services (RTS)  [place], [place]  Fellowship [address]                                               [place]  [facility] Resources: [phone]               General Therapy                                                Domenic Schwab, PhD        [address]  [place], [place] [address]         [place]   [address] [place], [place] [address] [phone]  [facility] Recovery [address] [place], [place] of [place] [address] [phone] Insurance/Medicaid/sponsorship through [facility] and Families                                              [address] [place]                                        [place], [place] [address]    Therapy/tele-psych/case         [place],   [address]  Adolescent/group home/case management [phone]                                           Rosette Reveal PhD       General therapy       Insurance   [phone]         Dr. Adele Schilder Insurance [id number] M-F  [place] Detox/Residential Medicaid, sponsorship [phone]   Alcohol Intoxication Alcohol intoxication occurs when you drink enough alcohol that it affects your ability to function. It can be mild or very severe. Drinking a lot of alcohol in a short time  is called binge drinking. This can be very harmful. Drinking alcohol can also be more dangerous if you are taking medicines or other drugs. Some of the effects caused by alcohol may include:  Loss of coordination.  Changes in mood and behavior.  Unclear thinking.  Trouble talking (slurred speech).  Throwing up (vomiting).  Confusion.  Slowed breathing.  Twitching and shaking (seizures).  Loss of consciousness. HOME CARE  Do not drive after drinking alcohol.  Drink enough water and fluids to keep your pee (urine) clear or pale yellow. Avoid caffeine.  Only take medicine as told by your doctor. GET HELP IF:  You throw up (vomit) many times.  You do not feel better after a few days.  You frequently have alcohol intoxication. Your doctor can help decide if you should see a substance use treatment counselor. GET HELP RIGHT AWAY IF:  You become shaky when you stop drinking.  You have twitching and shaking.  You throw up blood. It may look bright red or like coffee grounds.  You notice blood in your poop (bowel movements).  You become lightheaded or pass out (faint). MAKE SURE YOU:   Understand these instructions.  Will watch your condition.  Will get help right away if you are not doing well or get worse. Document Released: 02/09/2008 Document Revised: 04/25/2013 Document Reviewed: 01/26/2013 [facility] Patient Information 2015 [place], [place]. This information is not intended to replace advice given to you by your health care provider. Make sure you discuss any questions you have with your health care provider.  How Much is Too Much Alcohol? Drinking too much alcohol can cause injury, accidents, and health problems. These types of problems can include:   Car crashes.  Falls.  Family fighting (domestic violence).  Drowning.  Fights.  Injuries.  Burns.  Damage to certain organs.  Having a baby with birth defects. ONE DRINK CAN BE TOO MUCH WHEN YOU  ARE:  Working.  Pregnant or breastfeeding.  Taking medicines. Ask your doctor.  Driving or planning to drive. WHAT IS A STANDARD DRINK?   1 regular beer (12 ounces or 360 milliliters).  1 glass of wine (5 ounces or 150 milliliters).  1 shot of liquor (1.5 ounces or 45 milliliters). BLOOD ALCOHOL LEVELS   .00 A person is sober.  [place] Kitchen03 A person has no trouble keeping balance, talking, or seeing right, but a "buzz" may be felt.  [place] Kitchen05 A person feels "buzzed" and relaxed.  [place] Kitchen08 or .10  A person is drunk. He or she has trouble talking, seeing right, and keeping his or her balance.  .15 A person loses body control and may pass out (blackout).  .20 A person has trouble walking (staggering) and throws up (vomits).  .30 A person will pass out (unconscious).  .40+ A person will be in a coma. Death is possible. If you or someone you know has a drinking problem, get help from a doctor.  Document Released: 06/19/2009 Document Revised: 11/15/2011 Document Reviewed: 06/19/2009 [facility] Patient Information 2015 [place], [place]. This information is not intended to replace advice given to you by your health care provider. Make sure you discuss any questions you have with your health care provider.

## 2014-08-24 NOTE — ED Notes (Signed)
Pt arouses to name. He denies pain or nausea. Pt reports to this RN he would like detox form alcohol. Mercedes PA notified for further orders and she reports she will speak with patient shortly.

## 2014-08-24 NOTE — ED Notes (Signed)
Pt resting. Rise and fall of chest noted. Seizure pads in place. Pt not disturbed but will continue to be monitored.

## 2014-08-24 NOTE — ED Notes (Signed)
Bed: [facility] Expected date:  Expected time:  Means of arrival:  Comments: EMS/ETOH 

## 2014-08-24 NOTE — ED Notes (Signed)
PA at bedside.

## 2014-08-24 NOTE — ED Notes (Signed)
Pt left prior to DC papers

## 2014-08-24 NOTE — ED Notes (Signed)
Pt eating breakfast 

## 2014-08-24 NOTE — ED Notes (Signed)
Pt not present at bedside.

## 2014-08-24 NOTE — ED Notes (Addendum)
Patient is not present in the bed.

## 2014-08-24 NOTE — ED Notes (Signed)
Pt ambulated to restroom with steady gait.

## 2014-08-29 ENCOUNTER — Emergency Department (HOSPITAL_COMMUNITY)
Admission: EM | Admit: 2014-08-29 | Discharge: 2014-08-29 | Disposition: A | Discharge status: Other Acute Inpatient Hospital | Payer: Federal, State, Local not specified - Other | Attending: Emergency Medicine | Admitting: Emergency Medicine

## 2014-08-29 ENCOUNTER — Inpatient Hospital Stay (HOSPITAL_COMMUNITY)
Admission: AD | Admit: 2014-08-29 | Discharge: 2014-09-04 | DRG: 885 | Disposition: A | Discharge status: Rehabilitation Facility | Payer: Federal, State, Local not specified - Other | Source: Intra-hospital | Attending: Psychiatry | Admitting: Psychiatry

## 2014-08-29 ENCOUNTER — Encounter (HOSPITAL_COMMUNITY): Payer: Self-pay | Admitting: Behavioral Health

## 2014-08-29 ENCOUNTER — Encounter (HOSPITAL_COMMUNITY): Payer: Self-pay

## 2014-08-29 DIAGNOSIS — F1020 Alcohol dependence, uncomplicated: Secondary | ICD-10-CM | POA: Diagnosis present

## 2014-08-29 DIAGNOSIS — R51 Headache: Secondary | ICD-10-CM | POA: Diagnosis present

## 2014-08-29 DIAGNOSIS — F1994 Other psychoactive substance use, unspecified with psychoactive substance-induced mood disorder: Secondary | ICD-10-CM

## 2014-08-29 DIAGNOSIS — F4321 Adjustment disorder with depressed mood: Secondary | ICD-10-CM

## 2014-08-29 DIAGNOSIS — Y905 Blood alcohol level of 100-119 mg/100 ml: Secondary | ICD-10-CM | POA: Diagnosis present

## 2014-08-29 DIAGNOSIS — I10 Essential (primary) hypertension: Secondary | ICD-10-CM | POA: Diagnosis present

## 2014-08-29 DIAGNOSIS — B1920 Unspecified viral hepatitis C without hepatic coma: Secondary | ICD-10-CM | POA: Diagnosis present

## 2014-08-29 DIAGNOSIS — Y9289 Other specified places as the place of occurrence of the external cause: Secondary | ICD-10-CM | POA: Insufficient documentation

## 2014-08-29 DIAGNOSIS — S00411A Abrasion of right ear, initial encounter: Secondary | ICD-10-CM | POA: Insufficient documentation

## 2014-08-29 DIAGNOSIS — F329 Major depressive disorder, single episode, unspecified: Secondary | ICD-10-CM | POA: Insufficient documentation

## 2014-08-29 DIAGNOSIS — R569 Unspecified convulsions: Secondary | ICD-10-CM | POA: Diagnosis present

## 2014-08-29 DIAGNOSIS — Z09 Encounter for follow-up examination after completed treatment for conditions other than malignant neoplasm: Secondary | ICD-10-CM

## 2014-08-29 DIAGNOSIS — Z590 Homelessness: Secondary | ICD-10-CM | POA: Diagnosis not present

## 2014-08-29 DIAGNOSIS — Z720 Tobacco use: Secondary | ICD-10-CM | POA: Insufficient documentation

## 2014-08-29 DIAGNOSIS — Z8619 Personal history of other infectious and parasitic diseases: Secondary | ICD-10-CM | POA: Insufficient documentation

## 2014-08-29 DIAGNOSIS — F332 Major depressive disorder, recurrent severe without psychotic features: Secondary | ICD-10-CM | POA: Diagnosis present

## 2014-08-29 DIAGNOSIS — Z79899 Other long term (current) drug therapy: Secondary | ICD-10-CM | POA: Insufficient documentation

## 2014-08-29 DIAGNOSIS — B20 Human immunodeficiency virus [HIV] disease: Secondary | ICD-10-CM | POA: Diagnosis present

## 2014-08-29 DIAGNOSIS — R45851 Suicidal ideations: Secondary | ICD-10-CM | POA: Diagnosis present

## 2014-08-29 DIAGNOSIS — Q282 Arteriovenous malformation of cerebral vessels: Secondary | ICD-10-CM

## 2014-08-29 DIAGNOSIS — Z862 Personal history of diseases of the blood and blood-forming organs and certain disorders involving the immune mechanism: Secondary | ICD-10-CM | POA: Insufficient documentation

## 2014-08-29 DIAGNOSIS — F17210 Nicotine dependence, cigarettes, uncomplicated: Secondary | ICD-10-CM | POA: Diagnosis present

## 2014-08-29 DIAGNOSIS — F1010 Alcohol abuse, uncomplicated: Secondary | ICD-10-CM

## 2014-08-29 DIAGNOSIS — Y9389 Activity, other specified: Secondary | ICD-10-CM | POA: Insufficient documentation

## 2014-08-29 DIAGNOSIS — F32A Depression, unspecified: Secondary | ICD-10-CM

## 2014-08-29 DIAGNOSIS — G40909 Epilepsy, unspecified, not intractable, without status epilepticus: Secondary | ICD-10-CM

## 2014-08-29 DIAGNOSIS — F1414 Cocaine abuse with cocaine-induced mood disorder: Secondary | ICD-10-CM | POA: Diagnosis present

## 2014-08-29 DIAGNOSIS — Z21 Asymptomatic human immunodeficiency virus [HIV] infection status: Secondary | ICD-10-CM | POA: Insufficient documentation

## 2014-08-29 DIAGNOSIS — Z9289 Personal history of other medical treatment: Secondary | ICD-10-CM

## 2014-08-29 DIAGNOSIS — F419 Anxiety disorder, unspecified: Secondary | ICD-10-CM | POA: Diagnosis present

## 2014-08-29 DIAGNOSIS — Y998 Other external cause status: Secondary | ICD-10-CM | POA: Insufficient documentation

## 2014-08-29 DIAGNOSIS — X58XXXA Exposure to other specified factors, initial encounter: Secondary | ICD-10-CM | POA: Insufficient documentation

## 2014-08-29 DIAGNOSIS — F1410 Cocaine abuse, uncomplicated: Secondary | ICD-10-CM

## 2014-08-29 HISTORY — DX: Immunodeficiency, unspecified: D84.9

## 2014-08-29 HISTORY — DX: Unspecified viral hepatitis C without hepatic coma: B19.20

## 2014-08-29 LAB — COMPREHENSIVE METABOLIC PANEL
ALK PHOS: 81 U/L (ref 39–117)
ALT: 116 U/L — AB (ref 0–53)
AST: 175 U/L — AB (ref 0–37)
Albumin: 3.8 g/dL (ref 3.5–5.2)
Anion gap: 6 (ref 5–15)
BILIRUBIN TOTAL: 0.5 mg/dL (ref 0.3–1.2)
BUN: 17 mg/dL (ref 6–23)
CO2: 27 mmol/L (ref 19–32)
Calcium: 8.6 mg/dL (ref 8.4–10.5)
Chloride: 107 mEq/L (ref 96–112)
Creatinine, Ser: 1.09 mg/dL (ref 0.50–1.35)
GFR calc Af Amer: 88 mL/min — ABNORMAL LOW (ref >90)
GFR, EST NON AFRICAN AMERICAN: 76 mL/min — AB (ref >90)
GLUCOSE: 75 mg/dL (ref 70–99)
Potassium: 3.4 mmol/L — ABNORMAL LOW (ref 3.5–5.1)
SODIUM: 140 mmol/L (ref 135–145)
Total Protein: 7.9 g/dL (ref 6.0–8.3)

## 2014-08-29 LAB — CBC WITH DIFFERENTIAL/PLATELET
BASOS ABS: 0 10*3/uL (ref 0.0–0.1)
Basophils Relative: 0 % (ref 0–1)
Eosinophils Absolute: 0 10*3/uL (ref 0.0–0.7)
Eosinophils Relative: 1 % (ref 0–5)
HCT: 38.3 % — ABNORMAL LOW (ref 39.0–52.0)
Hemoglobin: 12.5 g/dL — ABNORMAL LOW (ref 13.0–17.0)
LYMPHS ABS: 2.6 10*3/uL (ref 0.7–4.0)
LYMPHS PCT: 52 % — AB (ref 12–46)
MCH: 28.4 pg (ref 26.0–34.0)
MCHC: 32.6 g/dL (ref 30.0–36.0)
MCV: 87 fL (ref 78.0–100.0)
Monocytes Absolute: 0.4 10*3/uL (ref 0.1–1.0)
Monocytes Relative: 8 % (ref 3–12)
NEUTROS ABS: 2 10*3/uL (ref 1.7–7.7)
Neutrophils Relative %: 39 % — ABNORMAL LOW (ref 43–77)
PLATELETS: 302 10*3/uL (ref 150–400)
RBC: 4.4 MIL/uL (ref 4.22–5.81)
RDW: 12.6 % (ref 11.5–15.5)
WBC: 5 10*3/uL (ref 4.0–10.5)

## 2014-08-29 LAB — SALICYLATE LEVEL

## 2014-08-29 LAB — RAPID URINE DRUG SCREEN, HOSP PERFORMED
AMPHETAMINES: NOT DETECTED
BARBITURATES: NOT DETECTED
Benzodiazepines: POSITIVE — AB
Cocaine: POSITIVE — AB
Opiates: NOT DETECTED
TETRAHYDROCANNABINOL: NOT DETECTED

## 2014-08-29 LAB — T-HELPER CELLS (CD4) COUNT (NOT AT ARMC)
CD4 T CELL HELPER: 26 % — AB (ref 33–55)
CD4 T Cell Abs: 740 /uL (ref 400–2700)

## 2014-08-29 LAB — ETHANOL: Alcohol, Ethyl (B): 109 mg/dL — ABNORMAL HIGH (ref 0–9)

## 2014-08-29 LAB — ACETAMINOPHEN LEVEL

## 2014-08-29 MED ORDER — HYDROCHLOROTHIAZIDE 12.5 MG PO CAPS
12.5000 mg | ORAL_CAPSULE | Freq: Every day | ORAL | Status: DC
  Administered 2014-08-30 – 2014-09-04 (×6): 12.5 mg via ORAL
  Filled 2014-08-29 (×8): qty 1

## 2014-08-29 MED ORDER — LEVETIRACETAM 500 MG PO TABS
500.0000 mg | ORAL_TABLET | Freq: Once | ORAL | Status: AC
  Administered 2014-08-29: 500 mg via ORAL
  Filled 2014-08-29: qty 1

## 2014-08-29 MED ORDER — ONDANSETRON HCL 4 MG PO TABS: 4.0000 mg | ORAL_TABLET | Freq: Three times a day (TID) | ORAL | Status: DC | PRN

## 2014-08-29 MED ORDER — EFAVIRENZ-EMTRICITAB-TENOFOVIR 600-200-300 MG PO TABS
1.0000 | ORAL_TABLET | Freq: Once | ORAL | Status: AC
  Administered 2014-08-29: 1 via ORAL
  Filled 2014-08-29: qty 1

## 2014-08-29 MED ORDER — LISINOPRIL-HYDROCHLOROTHIAZIDE 10-12.5 MG PO TABS: 1.0000 | ORAL_TABLET | Freq: Every day | ORAL | Status: DC

## 2014-08-29 MED ORDER — MAGNESIUM HYDROXIDE 400 MG/5ML PO SUSP: 30.0000 mL | Freq: Every day | ORAL | Status: DC | PRN

## 2014-08-29 MED ORDER — NICOTINE 21 MG/24HR TD PT24
21.0000 mg | MEDICATED_PATCH | Freq: Every day | TRANSDERMAL | Status: DC
  Filled 2014-08-29 (×7): qty 1

## 2014-08-29 MED ORDER — LORAZEPAM 1 MG PO TABS
0.0000 mg | ORAL_TABLET | Freq: Four times a day (QID) | ORAL | Status: AC
  Administered 2014-08-29: 2 mg via ORAL
  Administered 2014-08-30 (×4): 1 mg via ORAL
  Administered 2014-08-30: 2 mg via ORAL
  Filled 2014-08-29: qty 2
  Filled 2014-08-29 (×2): qty 1
  Filled 2014-08-29: qty 2
  Filled 2014-08-29 (×2): qty 1

## 2014-08-29 MED ORDER — EFAVIRENZ-EMTRICITAB-TENOFOVIR 600-200-300 MG PO TABS
1.0000 | ORAL_TABLET | Freq: Every day | ORAL | Status: DC
  Administered 2014-08-30 – 2014-09-03 (×6): 1 via ORAL
  Filled 2014-08-29 (×8): qty 1

## 2014-08-29 MED ORDER — LORAZEPAM 1 MG PO TABS: 1.0000 mg | ORAL_TABLET | Freq: Three times a day (TID) | ORAL | Status: DC | PRN

## 2014-08-29 MED ORDER — ZOLPIDEM TARTRATE 5 MG PO TABS: 5.0000 mg | ORAL_TABLET | Freq: Every evening | ORAL | Status: DC | PRN

## 2014-08-29 MED ORDER — ENSURE COMPLETE PO LIQD
237.0000 mL | Freq: Two times a day (BID) | ORAL | Status: DC
  Administered 2014-08-30 – 2014-08-31 (×2): 237 mL via ORAL

## 2014-08-29 MED ORDER — LORAZEPAM 1 MG PO TABS
0.0000 mg | ORAL_TABLET | Freq: Two times a day (BID) | ORAL | Status: DC
  Administered 2014-08-31: 2 mg via ORAL
  Administered 2014-08-31: 1 mg via ORAL
  Filled 2014-08-29: qty 2
  Filled 2014-08-29: qty 1

## 2014-08-29 MED ORDER — THIAMINE HCL 100 MG/ML IJ SOLN: 100.0000 mg | Freq: Every day | INTRAMUSCULAR | Status: DC

## 2014-08-29 MED ORDER — NICOTINE 21 MG/24HR TD PT24
21.0000 mg | MEDICATED_PATCH | Freq: Every day | TRANSDERMAL | Status: DC
  Filled 2014-08-29: qty 1

## 2014-08-29 MED ORDER — IBUPROFEN 600 MG PO TABS
600.0000 mg | ORAL_TABLET | Freq: Three times a day (TID) | ORAL | Status: DC | PRN
  Administered 2014-09-02 – 2014-09-03 (×3): 600 mg via ORAL
  Filled 2014-08-29 (×3): qty 1

## 2014-08-29 MED ORDER — ACETAMINOPHEN 325 MG PO TABS: 650.0000 mg | ORAL_TABLET | ORAL | Status: DC | PRN

## 2014-08-29 MED ORDER — LISINOPRIL 10 MG PO TABS
10.0000 mg | ORAL_TABLET | Freq: Every day | ORAL | Status: DC
  Administered 2014-08-30 – 2014-09-04 (×6): 10 mg via ORAL
  Filled 2014-08-29 (×5): qty 1
  Filled 2014-08-29: qty 2
  Filled 2014-08-29: qty 1

## 2014-08-29 MED ORDER — LISINOPRIL 10 MG PO TABS
10.0000 mg | ORAL_TABLET | Freq: Every day | ORAL | Status: DC
Start: 2014-08-29 — End: 2014-08-29
  Administered 2014-08-29: 10 mg via ORAL
  Filled 2014-08-29: qty 1

## 2014-08-29 MED ORDER — LEVETIRACETAM 500 MG PO TABS
500.0000 mg | ORAL_TABLET | Freq: Two times a day (BID) | ORAL | Status: DC
  Administered 2014-08-29 – 2014-09-04 (×12): 500 mg via ORAL
  Filled 2014-08-29 (×16): qty 1

## 2014-08-29 MED ORDER — VITAMIN B-1 100 MG PO TABS
100.0000 mg | ORAL_TABLET | Freq: Every day | ORAL | Status: DC
  Administered 2014-08-29: 100 mg via ORAL
  Filled 2014-08-29: qty 1

## 2014-08-29 MED ORDER — ALUM & MAG HYDROXIDE-SIMETH 200-200-20 MG/5ML PO SUSP: 30.0000 mL | ORAL | Status: DC | PRN

## 2014-08-29 MED ORDER — HYDROCHLOROTHIAZIDE 12.5 MG PO CAPS
12.5000 mg | ORAL_CAPSULE | Freq: Every day | ORAL | Status: DC
  Administered 2014-08-29: 12.5 mg via ORAL
  Filled 2014-08-29: qty 1

## 2014-08-29 MED ORDER — IBUPROFEN 200 MG PO TABS: 600.0000 mg | ORAL_TABLET | Freq: Three times a day (TID) | ORAL | Status: DC | PRN

## 2014-08-29 MED ORDER — LEVETIRACETAM 500 MG PO TABS
500.0000 mg | ORAL_TABLET | Freq: Two times a day (BID) | ORAL | Status: DC
  Administered 2014-08-29: 500 mg via ORAL
  Filled 2014-08-29 (×2): qty 1

## 2014-08-29 MED ORDER — EFAVIRENZ-EMTRICITAB-TENOFOVIR 600-200-300 MG PO TABS
1.0000 | ORAL_TABLET | Freq: Every day | ORAL | Status: DC
  Filled 2014-08-29: qty 1

## 2014-08-29 MED ORDER — LORAZEPAM 1 MG PO TABS: 0.0000 mg | ORAL_TABLET | Freq: Two times a day (BID) | ORAL | Status: DC

## 2014-08-29 MED ORDER — ONDANSETRON HCL 4 MG PO TABS
4.0000 mg | ORAL_TABLET | Freq: Three times a day (TID) | ORAL | Status: DC | PRN
  Administered 2014-09-02: 4 mg via ORAL
  Filled 2014-08-29: qty 1

## 2014-08-29 MED ORDER — LORAZEPAM 1 MG PO TABS
0.0000 mg | ORAL_TABLET | Freq: Four times a day (QID) | ORAL | Status: DC
  Administered 2014-08-29: 1 mg via ORAL
  Filled 2014-08-29: qty 1

## 2014-08-29 MED ORDER — VITAMIN B-1 100 MG PO TABS
100.0000 mg | ORAL_TABLET | Freq: Every day | ORAL | Status: DC
Start: 2014-08-30 — End: 2014-09-04
  Administered 2014-08-30 – 2014-09-03 (×6): 100 mg via ORAL
  Filled 2014-08-29 (×8): qty 1

## 2014-08-29 NOTE — ED Notes (Addendum)
Per EMS, patient called 911 because he had 2 seizures in 24 hours and felt as if he were going to have another one.  History of seizure disorder, but he has not been taking his medication as prescribed.  He reported to paramedic that he has been using ETOH for the past 21 days.
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
## 2014-08-29 NOTE — ED Notes (Signed)
Isaac [name], TTS, updated this nurse after assessing patient. An inpatient facility is being sought for patient to obtain help with his addictions.

## 2014-08-29 NOTE — Tx Team (Signed)
Initial Interdisciplinary Treatment Plan   PATIENT STRESSORS: Health problems Substance abuse   PATIENT STRENGTHS: Ability for insight Capable of independent living Motivation for treatment/growth   PROBLEM LIST: Problem List/Patient Goals Date to be addressed Date deferred Reason deferred Estimated date of resolution  Substance abuse 08/29/14     homeless 08/29/14     unemployed  08/29/14                                          DISCHARGE CRITERIA:  Ability to meet basic life and health needs Adequate post-discharge living arrangements Improved stabilization in mood, thinking, and/or behavior Verbal commitment to aftercare and medication compliance  PRELIMINARY DISCHARGE PLAN: Attend aftercare/continuing care group Attend PHP/IOP  PATIENT/FAMIILY INVOLVEMENT: This treatment plan has been presented to and reviewed with the patient, Isaac [name], and/or family member.  The patient and family have been given the opportunity to ask questions and make suggestions.  Analy Bassford L 08/29/2014, 5:50 PM

## 2014-08-29 NOTE — Tx Team (Signed)
Admission note: Pt seeking help to detox from alcohol and cocaine abuse. Pt reported that he drinks five 40 oz, three to five bottles of wine and a fifth of liquor daily. Pt reported using cocaine, 2 grams daily. Pt started using cocaine at the age of 53. Pt is currently homeless. 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  Pt initially when in to the hospital because of a seizure. Pt then decided that he needed to seek help for his drug use. Pt requesting long-term tx. Pt reported hx of seizures. Last known seizure 08-28-14.

## 2014-08-29 NOTE — ED Notes (Signed)
Quincy Carnes, PA at bedside.

## 2014-08-29 NOTE — BH Assessment (Signed)
Per Quincy Carnes, PA-C note pt is medically cleared and ready for assessment. Pt presented with seizure and reports problems with etoh and crack and current SI.  Assessment to commence shortly.   Lear Ng, [facility] Triage Specialist 08/29/2014 5:35 AM

## 2014-08-29 NOTE — ED Notes (Signed)
Bed: WA17 Expected date: 08/29/14 Expected time: 3:29 AM Means of arrival: Ambulance Comments: 53 yo M  Seizures, headache

## 2014-08-29 NOTE — BH Assessment (Signed)
[place] Assessment Progress Note Accepted to 301-1 by [facility], can come after 3:00 pm per Randall Hiss, [facility].

## 2014-08-29 NOTE — ED Provider Notes (Signed)
CSN: [id number]     Arrival date & time 08/29/14  0343 History   First MD Initiated Contact with Patient 08/29/14 0400     Chief Complaint  Patient presents with  . Seizures     (Consider location/radiation/quality/duration/timing/severity/associated sxs/prior Treatment) Patient is a 53 y.o. male presenting with seizures. The history is provided by the patient and medical records.  Seizures   This is a 53 year old male with past medical history significant for hypertension, HIV, seizure disorder, hepatitis C, presenting to the ED for seizures. Patient states over the past 24 hours he has had 2 separate seizures, each lasting an unknown amount of time. He states he is currently homeless, seizures were unwitnessed. States he felt that he was going to have another one so he called EMS.  Patient does not he has been off of his seizure and HIV meds for the past 2 weeks. Patient does report he has been on a "bender" for the past 19 days. He has been drinking and using crack cocaine on a daily basis, last use was earlier this morning. Patient is currently homeless, which she states is contributing to his repetitive drug use. Patient also notes he has been having suicidal thoughts and the only reason he has not acted on these thoughts is because he does not want to go to hell. He denies any homicidal ideation. He denies any auditory or visual hallucinations.  Past Medical History  Diagnosis Date  . Hypertension   . HIV (human immunodeficiency virus infection)   . Seizures   . Immune deficiency disorder   . Hepatitis C    History reviewed. No pertinent past surgical history. History reviewed. No pertinent family history. History  Substance Use Topics  . Smoking status: Current Every Day Smoker -- 0.50 packs/day for 35 years    Types: Cigarettes    Start date: 10/28/2011  . Smokeless tobacco: Never Used  . Alcohol Use: 0.0 oz/week     Comment: occasional    Review of Systems  Neurological:  Positive for seizures.  Psychiatric/Behavioral: Positive for suicidal ideas.  All other systems reviewed and are negative.     Allergies  Review of patient's allergies indicates no known allergies.  Home Medications   Prior to Admission medications   Medication Sig Start Date End Date Taking? Authorizing Provider  butalbital-acetaminophen-caffeine (FIORICET) 50-325-40 MG per tablet Take 1-2 tablets by mouth every 6 (six) hours as needed for headache. 07/15/14 07/15/15  Kalman Drape, MD  efavirenz-emtricitabine-tenofovir (ATRIPLA) 600-200-300 MG per tablet TAKE 1 TABLET BY MOUTH DAILY 06/17/14   Campbell Riches, MD  levETIRAcetam (KEPPRA) 500 MG tablet TAKE 1 TABLET BY MOUTH TWICE DAILY 06/21/14   Campbell Riches, MD  lisinopril-hydrochlorothiazide (PRINZIDE,ZESTORETIC) 10-12.5 MG per tablet TAKE 1 TABLET BY MOUTH DAILY 08/06/14   Campbell Riches, MD  sildenafil (VIAGRA) 100 MG tablet Take 1 tablet (100 mg total) by mouth daily as needed. 06/17/14   Campbell Riches, MD   BP 125/74 mmHg  Pulse 83  Temp(Src) 97.8 F (36.6 C) (Oral)  Resp 20  SpO2 99%   Physical Exam  Constitutional: He is oriented to person, place, and time. He appears well-developed and well-nourished. No distress.  HENT:  Head: Normocephalic and atraumatic.  Mouth/Throat: Oropharynx is clear and moist.  Small abrasion noted behind right ear without bleeding; no tongue lacerations or abrasions noted; dentition intact  Eyes: Conjunctivae and EOM are normal. Pupils are equal, round, and reactive to light.  Neck:  Normal range of motion. Neck supple.  Cardiovascular: Normal rate, regular rhythm and normal heart sounds.   Pulmonary/Chest: Effort normal and breath sounds normal. No respiratory distress. He has no wheezes.  Abdominal: Soft. Bowel sounds are normal. There is no tenderness. There is no guarding.  Musculoskeletal: Normal range of motion.  Neurological: He is alert and oriented to person, place, and  time. He displays no tremor. He displays no seizure activity.  AAOx3, answering questions appropriately; equal strength UE and LE bilaterally; CN grossly intact; moves all extremities appropriately without ataxia; no focal neuro deficits or facial asymmetry appreciated  Skin: Skin is warm and dry. He is not diaphoretic.  Psychiatric: He has a normal mood and affect. He is not actively hallucinating. He expresses suicidal ideation. He expresses no homicidal ideation. He expresses no suicidal plans and no homicidal plans.  Nursing note and vitals reviewed.   ED Course  Procedures (including critical care time) Labs Review Labs Reviewed  CBC WITH DIFFERENTIAL - Abnormal; Notable for the following:    Hemoglobin 12.5 (*)    HCT 38.3 (*)    Neutrophils Relative % 39 (*)    Lymphocytes Relative 52 (*)    All other components within normal limits  COMPREHENSIVE METABOLIC PANEL - Abnormal; Notable for the following:    Potassium 3.4 (*)    AST 175 (*)    ALT 116 (*)    GFR calc non Af Amer 76 (*)    GFR calc Af Amer 88 (*)    All other components within normal limits  ETHANOL - Abnormal; Notable for the following:    Alcohol, Ethyl (B) 109 (*)    All other components within normal limits  ACETAMINOPHEN LEVEL - Abnormal; Notable for the following:    Acetaminophen (Tylenol), Serum <10.0 (*)    All other components within normal limits  SALICYLATE LEVEL  URINE RAPID DRUG SCREEN (HOSP PERFORMED)    Imaging Review No results found.   EKG Interpretation None      MDM   Final diagnoses:  Seizure disorder  Suicidal ideation   53 year old male with seizures and suicidal ideation. Last seizure was approximately 12 hours ago. On arrival to ED patient is alert and baseline oriented. He has a small abrasion behind his right ear, but no other signs of traumatic injury. His neurologic exam is nonfocal. He endorses suicidal ideation without specific plan. He also notes crack cocaine and  alcohol use daily for the past 3 weeks. Lab work as above-- ethanol 109, patient however is clinically sober. Patient medically cleared and awaiting TTS evaluation.  Patient given dose of his Atripla and Keppra.  Larene Pickett, PA-C 08/29/14 [place], MD 08/29/14 [phone]

## 2014-08-29 NOTE — ED Notes (Signed)
Patient being transferred via [place] to [facility] for inpatient hospitalization. Q15 minute safety checks maintained until discharge. No distress upon discharge.

## 2014-08-29 NOTE — ED Notes (Signed)
Pt unable to void at this time. 

## 2014-08-29 NOTE — ED Notes (Signed)
Patient transported to X-ray 

## 2014-08-29 NOTE — Progress Notes (Signed)
Patient ID: Isaac [name], male   DOB: [birth year], 53 y.o.   MRN: [id number] D)  Has been sleeping this evening, covers are pulled up and over his head, but face exposed.  Resp reg, unlabored, eyes closed, has voiced no c/o's.  Didn't attend karaoke group this evening A)  Will continue to monitor for safety, continue POC  R)  Safety maintained.

## 2014-08-29 NOTE — BH Assessment (Signed)
Relayed results of assessment to Patriciaann Clan, [place]. Per Patriciaann Clan, PA pt meets inpt criteria but is declined [facility] due to uncontrolled seizures. He suggest pt be monitored, and possible neuro consult ordered if pt has been medication compliant prior to ED arrival. TTS to seek placement.   Informed Dr. Florina Ou of recommendations. He reports pt's HIV seems to be well managed and likely would not account for recent onset of hallucinations.   Informed Pt and RN of recommendations. Per RN pt is unsure when he last took his medications but notes he lost track when binge began about 19 days ago.    Lear Ng, [facility] Triage Specialist 08/29/2014 6:24 AM
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
## 2014-08-29 NOTE — Progress Notes (Signed)
  CARE MANAGEMENT ED NOTE 08/29/2014  Patient:  Isaac [name], Isaac [name]   Account Number:  [id number]  Date Initiated:  08/29/2014  Documentation initiated by:  Isaac [name]  Subjective/Objective Assessment:   54 yr old self pay [name] called 911 because he had 2 seizures in 24 hours and felt as if he were going to have another one.  History of seizure disorder, but he has not been taking his medication as prescribed.      Subjective/Objective Assessment Detail:   No seizures since brought to [facility] ED, Positive for cocaine, benzo etoh level  109 K 3.4 given supplemental K in ED  Informs CM he primarily sees Isaac [name] and has an orange card he recently got updated but unsure of pcp listed on card  Pt discussed in [place] to re inquire of [place] need for placement since pt without seizure in ED  1210 followed up with [facility] ED nursing [facility]     Action/Plan:   no pcp , not taking meds SPoke with pt about pcp see notes above Spoke with him about needymeds, goodrx to use to get meds sent to pcp office after applying to needymeds and checking good rx for pharmacies indicating lowest cost for each   Action/Plan Detail:   med Discussed differences in pcps and infectious disease specialist and importance of need for pcp also Explained that Johnnye [name] is not a pcp Pt voiced understanding   Anticipated DC Date:       Status Recommendation to Physician:   Result of Recommendation:    Other ED [place]  Other  Outpatient Services - Pt will follow up  PCP issues  [facility] / [facility] (established/new)    Choice offered to / List presented to:            Status of service:  Completed, signed off  ED Comments:   ED Comments Detail:  Inquired of [facility] staff, Isaac [name] what pcp pt assigned to if active orange card is correct pending response   CM spoke with pt who confirms self pay [facility] resident with no pcp. CM discussed and provided  written information for self pay pcps, importance of pcp for f/u care, www.needymeds.org, www.goodrx.com, discounted pharmacies and other [employer] such as [employer], affordable care act,  financial assistance, DSS and  health department  Reviewed resources for [employer] self pay pcps like Isaac [name], family medicine at [address], [facility] family practice, general medical clinics, [facility] urgent care plus others, medication resources, [facility] out patient pharmacies and housing Pt voiced understanding and appreciation of resources provided   Provided [facility] contact information

## 2014-08-30 DIAGNOSIS — F1410 Cocaine abuse, uncomplicated: Secondary | ICD-10-CM

## 2014-08-30 DIAGNOSIS — R45851 Suicidal ideations: Secondary | ICD-10-CM

## 2014-08-30 DIAGNOSIS — F332 Major depressive disorder, recurrent severe without psychotic features: Principal | ICD-10-CM

## 2014-08-30 DIAGNOSIS — F1020 Alcohol dependence, uncomplicated: Secondary | ICD-10-CM

## 2014-08-30 NOTE — H&P (Signed)
Psychiatric Admission Assessment Adult  Patient Identification:  Isaac [name] Date of Evaluation:  08/30/2014 Chief Complaint:  DEPRESSION DISORDER NOS SUBSTANCE ABUSE History of Present Illness:: Patient presented to the ED reporting a history of seizures with SI due to being off of his medication for two weeks. He notes that he has been drinking daily for over a month, and using as much crack cocaine as he could get.  He states the SI has worsened with the increasing seizures, but he has not plans to harm himself at this time.     He is homeless and is followed by Dr. Sherren Mocha with [facility] for his HIV.  Elements:  Location:  Adult unit. Quality:  chronic. Severity:  moderate to severe. Timing:  on going. Duration:  worsening over the previous two weeks;. Context:  Homeless, sick, no support, substance abusing. Associated Signs/Synptoms: Depression Symptoms:  depressed mood, anhedonia, psychomotor agitation, fatigue, feelings of worthlessness/guilt, difficulty concentrating, hopelessness, recurrent thoughts of death, suicidal thoughts without plan, anxiety, (Hypo) Manic Symptoms:  denies Anxiety Symptoms:  Excessive Worry, Psychotic Symptoms:  denies PTSD Symptoms: NA Total Time spent with patient: 30 minutes  Psychiatric Specialty Exam: Physical Exam  Psychiatric: His speech is normal. His mood appears anxious. He is withdrawn. Thought content is not delusional. Cognition and memory are normal. He expresses impulsivity and inappropriate judgment. He exhibits a depressed mood. He expresses suicidal ideation. He expresses no suicidal plans and no homicidal plans.  I agree with the exam completed in the ED with no exceptions at this time.    Review of Systems  Constitutional: Positive for chills and malaise/fatigue.  Eyes: Positive for blurred vision.  Respiratory: Negative.   Cardiovascular: Negative.   Gastrointestinal: Positive for nausea.  Genitourinary: Negative.    Musculoskeletal: Positive for myalgias and joint pain.  Skin: Negative.   Neurological: Positive for dizziness, tremors and headaches.  Endo/Heme/Allergies: Negative.   Psychiatric/Behavioral: Positive for depression and suicidal ideas. Negative for hallucinations. The patient is nervous/anxious.     Blood pressure 122/86, pulse 126, temperature 97.8 F (36.6 C), temperature source Oral, resp. rate 16, height 5' 8"  (1.727 m), weight 69.854 kg (154 lb).Body mass index is 23.42 kg/(m^2).  General Appearance: Disheveled  Eye Contact::  Poor  Speech:  Slow  Volume:  Decreased  Mood:  Depressed, Hopeless and Worthless  Affect:  Congruent  Thought Process:  Goal Directed  Orientation:  Full (Time, Place, and Person)  Thought Content:  WDL  Suicidal Thoughts:  Yes.  without intent/plan  Homicidal Thoughts:  No  Memory:  NA  Judgement:  Impaired  Insight:  Lacking  Psychomotor Activity:  Tremor  Concentration:  Poor  Recall:  [place] of Knowledge:Fair  Language: Good  Akathisia:  No  Handed:  Right  AIMS (if indicated):     Assets:  Communication Skills Desire for Improvement Resilience  Sleep:       Musculoskeletal: Strength & Muscle Tone: within normal limits Gait & Station: normal Patient leans: N/A  Past Psychiatric History: Diagnosis:  Hospitalizations:  Outpatient Care:  Substance Abuse Care:  Self-Mutilation:  Suicidal Attempts:  Violent Behaviors:   Past Medical History:   Past Medical History  Diagnosis Date  . Hypertension   . HIV (human immunodeficiency virus infection)   . Seizures   . Immune deficiency disorder   . Hepatitis C    Seizure History:  per patient Allergies:  No Known Allergies PTA Medications: Prescriptions prior to admission  Medication Sig  Dispense Refill Last Dose  . butalbital-acetaminophen-caffeine (FIORICET) 50-325-40 MG per tablet Take 1-2 tablets by mouth every 6 (six) hours as needed for headache. 20 tablet 0 not taking  at unknown  . efavirenz-emtricitabine-tenofovir (ATRIPLA) 600-200-300 MG per tablet TAKE 1 TABLET BY MOUTH DAILY 30 tablet 6 Past Week at Unknown time  . levETIRAcetam (KEPPRA) 500 MG tablet TAKE 1 TABLET BY MOUTH TWICE DAILY 60 tablet 5 Past Week at Unknown time  . lisinopril-hydrochlorothiazide (PRINZIDE,ZESTORETIC) 10-12.5 MG per tablet TAKE 1 TABLET BY MOUTH DAILY 30 tablet 5 Past Month at Unknown time  . sildenafil (VIAGRA) 100 MG tablet Take 1 tablet (100 mg total) by mouth daily as needed. (Patient not taking: Reported on 08/29/2014) 30 tablet 4 unknown    Previous Psychotropic Medications:  Medication/Dose                 Substance Abuse History in the last 12 months:  Yes.    Consequences of Substance Abuse: Medical Consequences:  worsening health problems  Social History:  reports that he has been smoking Cigarettes.  He started smoking about 2 years ago. He has a 17.5 pack-year smoking history. He has never used smokeless tobacco. He reports that he drinks alcohol. He reports that he does not use illicit drugs. Additional Social History:                      Current Place of Residence:   Place of Birth:   Family Members: Marital Status:  Single Children:  Sons:  Daughters: Relationships: Education:  [employer] Problems/Performance: Religious Beliefs/Practices: History of Abuse (Emotional/Phsycial/Sexual) Ship broker History:  None. Legal History: Hobbies/Interests:  Family History:  History reviewed. No pertinent family history.  Results for orders placed or performed during the hospital encounter of 08/29/14 (from the past 72 hour(s))  CBC with Differential     Status: Abnormal   Collection Time: 08/29/14  4:20 AM  Result Value Ref Range   WBC 5.0 4.0 - 10.5 K/uL   RBC 4.40 4.22 - 5.81 MIL/uL   Hemoglobin 12.5 (L) 13.0 - 17.0 g/dL   HCT 38.3 (L) 39.0 - 52.0 %   MCV 87.0 78.0 - 100.0 fL   MCH 28.4 26.0 -  34.0 pg   MCHC 32.6 30.0 - 36.0 g/dL   RDW 12.6 11.5 - 15.5 %   Platelets 302 150 - 400 K/uL   Neutrophils Relative % 39 (L) 43 - 77 %   Neutro Abs 2.0 1.7 - 7.7 K/uL   Lymphocytes Relative 52 (H) 12 - 46 %   Lymphs Abs 2.6 0.7 - 4.0 K/uL   Monocytes Relative 8 3 - 12 %   Monocytes Absolute 0.4 0.1 - 1.0 K/uL   Eosinophils Relative 1 0 - 5 %   Eosinophils Absolute 0.0 0.0 - 0.7 K/uL   Basophils Relative 0 0 - 1 %   Basophils Absolute 0.0 0.0 - 0.1 K/uL  Comprehensive metabolic panel     Status: Abnormal   Collection Time: 08/29/14  4:20 AM  Result Value Ref Range   Sodium 140 135 - 145 mmol/L    Comment: Please note change in reference range.   Potassium 3.4 (L) 3.5 - 5.1 mmol/L    Comment: Please note change in reference range.   Chloride 107 96 - 112 mEq/L   CO2 27 19 - 32 mmol/L   Glucose, Bld 75 70 - 99 mg/dL   BUN 17 6 - 23  mg/dL   Creatinine, Ser 1.09 0.50 - 1.35 mg/dL   Calcium 8.6 8.4 - 10.5 mg/dL   Total Protein 7.9 6.0 - 8.3 g/dL   Albumin 3.8 3.5 - 5.2 g/dL   AST 175 (H) 0 - 37 U/L   ALT 116 (H) 0 - 53 U/L   Alkaline Phosphatase 81 39 - 117 U/L   Total Bilirubin 0.5 0.3 - 1.2 mg/dL   GFR calc non Af Amer 76 (L) >90 mL/min   GFR calc Af Amer 88 (L) >90 mL/min    Comment: (NOTE) The eGFR has been calculated using the CKD EPI equation. This calculation has not been validated in all clinical situations. eGFR's persistently <90 mL/min signify possible Chronic Kidney Disease.    Anion gap 6 5 - 15  Ethanol     Status: Abnormal   Collection Time: 08/29/14  4:20 AM  Result Value Ref Range   Alcohol, Ethyl (B) 109 (H) 0 - 9 mg/dL    Comment:        LOWEST DETECTABLE LIMIT FOR SERUM ALCOHOL IS 11 mg/dL FOR MEDICAL PURPOSES ONLY   Acetaminophen level     Status: Abnormal   Collection Time: 08/29/14  4:20 AM  Result Value Ref Range   Acetaminophen (Tylenol), Serum <10.0 (L) 10 - 30 ug/mL    Comment:        THERAPEUTIC CONCENTRATIONS VARY SIGNIFICANTLY. A RANGE  OF 10-30 ug/mL MAY BE AN EFFECTIVE CONCENTRATION FOR MANY PATIENTS. HOWEVER, SOME ARE BEST TREATED AT CONCENTRATIONS OUTSIDE THIS RANGE. ACETAMINOPHEN CONCENTRATIONS >150 ug/mL AT 4 HOURS AFTER INGESTION AND >50 ug/mL AT 12 HOURS AFTER INGESTION ARE OFTEN ASSOCIATED WITH TOXIC REACTIONS.   Salicylate level     Status: None   Collection Time: 08/29/14  4:20 AM  Result Value Ref Range   Salicylate Lvl <6.6 2.8 - 20.0 mg/dL  Urine rapid drug screen (hosp performed)     Status: Abnormal   Collection Time: 08/29/14  7:19 AM  Result Value Ref Range   Opiates NONE DETECTED NONE DETECTED   Cocaine POSITIVE (A) NONE DETECTED   Benzodiazepines POSITIVE (A) NONE DETECTED   Amphetamines NONE DETECTED NONE DETECTED   Tetrahydrocannabinol NONE DETECTED NONE DETECTED   Barbiturates NONE DETECTED NONE DETECTED    Comment:        DRUG SCREEN FOR MEDICAL PURPOSES ONLY.  IF CONFIRMATION IS NEEDED FOR ANY PURPOSE, NOTIFY LAB WITHIN 5 DAYS.        LOWEST DETECTABLE LIMITS FOR URINE DRUG SCREEN Drug Class       Cutoff (ng/mL) Amphetamine      1000 Barbiturate      200 Benzodiazepine   063 Tricyclics       016 Opiates          300 Cocaine          300 THC              50   T-helper cells (CD4) count     Status: Abnormal   Collection Time: 08/29/14  7:51 AM  Result Value Ref Range   CD4 T Cell Abs 740 400 - 2700 /uL   CD4 % Helper T Cell 26 (L) 33 - 55 %   Psychological Evaluations:  Assessment:   DSM5:  Schizophrenia Disorders:   Obsessive-Compulsive Disorders:   Trauma-Stressor Disorders:   Substance/Addictive Disorders:  Alcohol Related Disorder - Severe (303.90) Depressive Disorders:  Major Depressive Disorder - Severe (296.23)  AXIS I:  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 Hypertension   . HIV (human immunodeficiency virus infection)   . Seizures   . Immune  deficiency disorder   . Hepatitis C    AXIS IV:  housing problems AXIS V:  51-60 moderate symptoms  Treatment Plan/Recommendations:  1. Admit for crisis management and stabilization. 2. Medication management to reduce current symptoms to base line and improve the patient's overall level of functioning. 3. Treat health problems as indicated. 4. Develop treatment plan to decrease risk of relapse upon discharge and to reduce the need for readmission. 5. Psycho-social education regarding relapse prevention and self care. 6. Health care follow up as needed for medical problems. 7. Restart home medications where appropriate.   Treatment Plan Summary: Daily contact with patient to assess and evaluate symptoms and progress in treatment Medication management Current Medications:  Current Facility-Administered Medications  Medication Dose Route Frequency Provider Last Rate Last Dose  . efavirenz-emtricitabine-tenofovir (ATRIPLA) 600-200-300 MG per tablet 1 tablet  1 tablet Oral QHS Shuvon Rankin, NP   1 tablet at 08/30/14 0145  . feeding supplement (ENSURE COMPLETE) (ENSURE COMPLETE) liquid 237 mL  237 mL Oral BID BM Elmarie Shiley, NP   237 mL at 08/30/14 1001  . lisinopril (PRINIVIL,ZESTRIL) tablet 10 mg  10 mg Oral Daily Shuvon Rankin, NP   10 mg at 08/30/14 1002   And  . hydrochlorothiazide (MICROZIDE) capsule 12.5 mg  12.5 mg Oral Daily Shuvon Rankin, NP   12.5 mg at 08/30/14 1003  . ibuprofen (ADVIL,MOTRIN) tablet 600 mg  600 mg Oral Q8H PRN Shuvon Rankin, NP      . levETIRAcetam (KEPPRA) tablet 500 mg  500 mg Oral BID Shuvon Rankin, NP   500 mg at 08/30/14 1002  . LORazepam (ATIVAN) tablet 0-4 mg  0-4 mg Oral 4 times per day Shuvon Rankin, NP   1 mg at 08/30/14 1303   Followed by  . [START ON 08/31/2014] LORazepam (ATIVAN) tablet 0-4 mg  0-4 mg Oral Q12H Shuvon Rankin, NP      . LORazepam (ATIVAN) tablet 1 mg  1 mg Oral Q8H PRN Shuvon Rankin, NP      . magnesium hydroxide (MILK OF MAGNESIA)  suspension 30 mL  30 mL Oral Daily PRN Shuvon Rankin, NP      . nicotine (NICODERM CQ - dosed in mg/24 hours) patch 21 mg  21 mg Transdermal Daily Shuvon Rankin, NP   21 mg at 08/30/14 1003  . ondansetron (ZOFRAN) tablet 4 mg  4 mg Oral Q8H PRN Shuvon Rankin, NP      . thiamine (VITAMIN B-1) tablet 100 mg  100 mg Oral Daily Shuvon Rankin, NP   100 mg at 08/30/14 1003   Or  . thiamine (B-1) injection 100 mg  100 mg Intravenous Daily Shuvon Rankin, NP        Observation Level/Precautions:  Detox  Laboratory:  None at this time  Psychotherapy:    Medications:  As noted  Consultations:  If needed  Discharge Concerns:  At risk for readmission  Estimated LOS: 5-7 days.  Other:     I certify that inpatient services furnished can reasonably be expected to improve the patient's condition.   Aya Geisel 12/25/20152:20 PM

## 2014-08-30 NOTE — Progress Notes (Signed)
D.  Pt laying in bed on approach, no complaints voiced.  Pt did not feel well enough to attend evening AA group.  Denies SI/HI/hallucinations at this time.  Minimal interaction.  A.  Support and encouragement offered.  R.  Pt remains safe on unit, will continue to monitor.

## 2014-08-30 NOTE — [facility] Suicide Risk Assessment (Signed)
   Nursing information obtained from:  Patient Demographic factors:  Male, Low socioeconomic status, Unemployed, homeless Current Mental Status:  Self-harm thoughts Loss Factors:  Decline in physical health, Financial problems / change in socioeconomic status, lost job Historical Factors:  Prior suicide attempts, Family history of mental illness or substance abuse Risk Reduction Factors:  Positive social support, Positive therapeutic relationship Total Time spent with patient: 45 minutes  CLINICAL FACTORS:   Depression:   Anhedonia Hopelessness Severe Alcohol/Substance Abuse/Dependencies  Psychiatric Specialty Exam: Physical Exam  ROS  Blood pressure 122/86, pulse 117, temperature 97.8 F (36.6 C), temperature source Oral, resp. rate 16, height 5\' 8"  (1.727 m), weight 69.854 kg (154 lb).Body mass index is 23.42 kg/(m^2).  General Appearance: Casual  Eye Contact::  Fair  Speech:  Clear and Coherent and Normal Rate  Volume:  Normal  Mood:  Depressed  Affect:  Congruent  Thought Process:  Goal Directed, Linear and Logical  Orientation:  Full (Time, Place, and Person)  Thought Content:  Negative  Suicidal Thoughts:  No  Homicidal Thoughts:  No  Memory:  Immediate;   Fair Recent;   Fair Remote;   Fair  Judgement:  Intact  Insight:  Fair  Psychomotor Activity:  Normal  Concentration:  Good  Recall:  Good  Fund of Knowledge:Good  Language: Good  Akathisia:  No  Handed:  Right  AIMS (if indicated):     Assets:  Communication Skills Desire for Improvement  Sleep:      Musculoskeletal: Strength & Muscle Tone: within normal limits Gait & Station: normal Patient leans: N/A  COGNITIVE FEATURES THAT CONTRIBUTE TO RISK:  Closed-mindedness Thought constriction (tunnel vision)    SUICIDE RISK:   Minimal: No identifiable suicidal ideation.  Patients presenting with no risk factors but with morbid ruminations; may be classified as minimal risk based on the severity of the  depressive symptoms  PLAN OF CARE:  I certify that inpatient services furnished can reasonably be expected to improve the patient's condition.  Charlcie Cradle 08/30/2014, 8:43 AM

## 2014-08-30 NOTE — Progress Notes (Signed)
Patient ID: Isaac [name], male   DOB: 08/19/1961, 53 y.o.   MRN: [id number]   D: Pt has been very flat and depressed on the unit today, he was in the bed most of the day. Pt on the detox protocol for withdrawals which includes tremors, agitation, and anxiety. Pt took all medication without any problems. Pt reported being negative SI/HI, no AH/VH noted. A: 15 min checks continued for patient safety. R: Pt safety maintained.

## 2014-08-31 MED ORDER — ENSURE COMPLETE PO LIQD
237.0000 mL | Freq: Three times a day (TID) | ORAL | Status: DC
  Administered 2014-08-31 – 2014-09-03 (×4): 237 mL via ORAL

## 2014-08-31 NOTE — [facility] Group Notes (Signed)
[place] Group Notes:  (Nursing/MHT/Case Management/Adjunct)  Date:  08/31/2014  Time:  3:23 PM  Type of Therapy:  Nurse Education  Participation Level:  Minimal  Participation Quality:  Attentive  Affect:  Blunted and Depressed  Cognitive:  minimal verbal engagement  Insight:  Limited  Engagement in Group:  Limited  Modes of Intervention:  Discussion and Education  Summary of Progress/Problems: Patient attended healthy coping skills group  Wadie Lessen 08/31/2014, 3:23 PM

## 2014-08-31 NOTE — [facility] Group Notes (Signed)
[place] LCSW Group Therapy  08/31/2014 1:25 PM  Type of Therapy and Topic:  Group Therapy: Avoiding Self-Sabotaging and Enabling Behaviors  Participation Level:   Active, engaged Mood:  Upbeat, talkative, supportive Description of Group:     Learn how to identify obstacles, self-sabotaging and enabling behaviors, what are they, why do we do them and what needs do these behaviors meet? Discuss unhealthy relationships and how to have positive healthy boundaries with those that sabotage and enable. Explore aspects of self-sabotage and enabling in yourself and how to limit these self-destructive behaviors in everyday life. A scaling question is used to help patient look at where they are now in their motivation to change, from 1 to 10 (lowest to highest motivation).  Therapeutic Goals: 1. Patient will identify one obstacle that relates to self-sabotage and enabling behaviors 2. Patient will identify one personal self-sabotaging or enabling behavior they did prior to admission 3. Patient able to establish a plan to change the above identified behavior they did prior to admission:   4. Patient will demonstrate ability to communicate their needs through discussion and/or role plays.   Summary of Patient Progress: Patient spoke at length about his self sabotaging behaviors, including pattern of maintaining sobriety, then feeling he is able to handle high risk situations, then putting himself in these situations and losing his sobriety over and over.  Realizes he needs to make abstinence his pattern, identified desire to be around supportive and familiar people as reason he puts himself in high risk situations.  Expressed difficulty w "wanting to be normal and take a drink or two now and then."  Therapeutic Modalities:    Cognitive Behavioral Therapy Person-Centered [place], [place] Social Worker   Beverely Pace 08/31/2014, 1:25 PM

## 2014-08-31 NOTE — Progress Notes (Signed)
Patient ID: MY MADARIAGA, male   DOB: [birth year], 53 y.o.   MRN: [id number]   D: Pt continues to be very flat and depressed on the unit today. Pt reported that he was homeless and that he has nothing. Pt was advised by staff that a 1:1 meeting could be arranged with the SW regarding aftercare planning. Pt agreed to the 1:1 with SW, SW Lelon Frohlich made aware. Pt reported that he remains very depressed, hopeless, and anxious. Pt continues to report withdrawal symptoms, however has not used any prns. Pt reported being negative SI/HI, no AH/VH noted. A: 15 min checks continued for patient safety. R: Pt safety maintained.

## 2014-08-31 NOTE — [facility] Group Notes (Signed)
[place] Group Notes:  (Nursing/MHT/Case Management/Adjunct)  Date:  08/31/2014  Time:  12:49 PM  Type of Therapy:  Psychoeducational Skills  Participation Level:  Active  Participation Quality:  Appropriate  Affect:  Appropriate  Cognitive:  Appropriate  Insight:  Appropriate  Engagement in Group:  Engaged  Modes of Intervention:  Discussion  Summary of Progress/Problems: Pt did attend self inventory group, pt reported that he was negative SI/HI, no AH/VH noted. Pt rated his depression as a 7, his helplessness/hopelessness as a 7, and his anxiety as a 7.     Pt reported concerns about discharge and requested to have a 1:1 with SW, pt advised that the SW will be made aware.   Isaac [name] Sh[place] 08/31/2014, 12:49 PM

## 2014-08-31 NOTE — Progress Notes (Signed)
[facility] MD Progress Note  08/31/2014 11:40 AM [facility]  MRN:  [id number] Subjective:  Met with patient today. He is still in bed and still reporting withdrawal symptoms. He is taking his medication as directed. Still reports nausea, tremors. Denies SI/HI or AVH, continues to feel hopeless, overwhelmed, worthless Diagnosis:   DSM5: Schizophrenia Disorders:   Obsessive-Compulsive Disorders:   Trauma-Stressor Disorders:   Substance/Addictive Disorders:  Alcohol Related Disorder - Severe (303.90) Depressive Disorders:  Major Depressive Disorder - Severe (296.23) Total Time spent with patient: 15 minutes    ADL's:  Impaired  Sleep: Poor  Appetite:  Poor  Suicidal Ideation:  denies Homicidal Ideation:  denies AEB (as evidenced by):  Psychiatric Specialty Exam: Physical Exam  ROS  Blood pressure 130/86, pulse 92, temperature 98 F (36.7 C), temperature source Oral, resp. rate 16, height _0  (1.727 m), weight 69.854 kg (154 lb).Body mass index is 23.42 kg/(m^2).  General Appearance: Disheveled  Eye Contact::  Poor  Speech:  Slow  Volume:  Normal  Mood:  Depressed  Affect:  Congruent  Thought Process:  Goal Directed  Orientation:  Full (Time, Place, and Person)  Thought Content:  NA  Suicidal Thoughts:  No  Homicidal Thoughts:  No  Memory:  NA  Judgement:  Poor  Insight:  Shallow  Psychomotor Activity:  Tremor  Concentration:  Poor  Recall:  Poor  Fund of Knowledge:Fair  Language: Good  Akathisia:  No  Handed:  Right  AIMS (if indicated):     Assets:  Communication Skills Desire for Improvement  Sleep:  Number of Hours: 6.75   Musculoskeletal: Strength & Muscle Tone: within normal limits Gait & Station: normal Patient leans: N/A  Current Medications: Current Facility-Administered Medications  Medication Dose Route Frequency Provider Last Rate Last Dose  . efavirenz-emtricitabine-tenofovir (ATRIPLA) 600-200-300 MG per tablet 1 tablet  1 tablet Oral QHS  Shuvon Rankin, NP   1 tablet at 08/30/14 2115  . feeding supplement (ENSURE COMPLETE) (ENSURE COMPLETE) liquid 237 mL  237 mL Oral BID BM Elmarie Shiley, NP   237 mL at 08/31/14 1101  . lisinopril (PRINIVIL,ZESTRIL) tablet 10 mg  10 mg Oral Daily Shuvon Rankin, NP   10 mg at 08/31/14 1100   And  . hydrochlorothiazide (MICROZIDE) capsule 12.5 mg  12.5 mg Oral Daily Shuvon Rankin, NP   12.5 mg at 08/31/14 1100  . ibuprofen (ADVIL,MOTRIN) tablet 600 mg  600 mg Oral Q8H PRN Shuvon Rankin, NP      . levETIRAcetam (KEPPRA) tablet 500 mg  500 mg Oral BID Shuvon Rankin, NP   500 mg at 08/31/14 1059  . LORazepam (ATIVAN) tablet 0-4 mg  0-4 mg Oral Q12H Shuvon Rankin, NP   2 mg at 08/31/14 1100  . LORazepam (ATIVAN) tablet 1 mg  1 mg Oral Q8H PRN Shuvon Rankin, NP      . magnesium hydroxide (MILK OF MAGNESIA) suspension 30 mL  30 mL Oral Daily PRN Shuvon Rankin, NP      . nicotine (NICODERM CQ - dosed in mg/24 hours) patch 21 mg  21 mg Transdermal Daily Shuvon Rankin, NP   21 mg at 08/30/14 1003  . ondansetron (ZOFRAN) tablet 4 mg  4 mg Oral Q8H PRN Shuvon Rankin, NP      . thiamine (VITAMIN B-1) tablet 100 mg  100 mg Oral Daily Shuvon Rankin, NP   100 mg at 08/31/14 1058   Or  . thiamine (B-1) injection 100 mg  100 mg Intravenous  Daily Shuvon Rankin, NP        Lab Results: No results found for this or any previous visit (from the past 48 hour(s)).  Physical Findings: AIMS: Facial and Oral Movements Muscles of Facial Expression: None, normal Lips and Perioral Area: None, normal Jaw: None, normal Tongue: None, normal,Extremity Movements Upper (arms, wrists, hands, fingers): None, normal Lower (legs, knees, ankles, toes): None, normal, Trunk Movements Neck, shoulders, hips: None, normal, Overall Severity Severity of abnormal movements (highest score from questions above): None, normal Incapacitation due to abnormal movements: None, normal Patient's awareness of abnormal movements (rate only patient's  report): No Awareness, Dental Status Current problems with teeth and/or dentures?: No Does patient usually wear dentures?: No  CIWA:  CIWA-Ar Total: 10 COWS:     Treatment Plan Summary: Daily contact with patient to assess and evaluate symptoms and progress in treatment Medication management  Plan: 1. Continue crisis management and stabilization. 2. Medication management to reduce current symptoms to base line and improve patient's overall level of functioning 3. Treat health problems as indicated. 4. Develop treatment plan to decrease risk of relapse upon discharge and the need for readmission. 5. Psycho-social education regarding relapse prevention and self care. 6. Health care follow up as needed for medical problems. 7. Continue home medications where appropriate.   Medical Decision Making Problem Points:  Established problem, stable/improving (1) Data Points:  Review of medication regiment & side effects (2)  I certify that inpatient services furnished can reasonably be expected to improve the patient's condition.  Marlane Hatcher. Teauna Dubach RPAC 1:28 PM 08/31/2014
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
## 2014-08-31 NOTE — Progress Notes (Signed)
NUTRITION ASSESSMENT  Pt identified as at risk on the Malnutrition Screen Tool  INTERVENTION: 1. Supplements: Increase Ensure order to TID. 2. Encourage PO intake  NUTRITION DIAGNOSIS: Unintentional weight loss related to sub-optimal intake as evidenced by per documentation.   Goal: Pt to meet >/= 90% of their estimated nutrition needs.  Monitor:  PO intake  Assessment:  Pt admitted with SI, depression, ETOH and substance abuse, and HIV+.  Per weight history documentation, pt has lost 16 lb over the last 11 months (9% weight loss x 11 months, insignificant for time frame). Pt is at nutritional risk given hx of HIV, ETOH and cocaine abuse, suspect poor quality diet PTA.  Pt would benefit from nutritional supplementation. Ensure already ordered BID. RD to order Ensure supplement TID.  Height: Ht Readings from Last 1 Encounters:  08/29/14 5\' 8"  (1.727 m)    Weight: Wt Readings from Last 1 Encounters:  08/29/14 154 lb (69.854 kg)    Weight Hx: Wt Readings from Last 10 Encounters:  08/29/14 154 lb (69.854 kg)  06/17/14 162 lb (73.483 kg)  10/30/13 165 lb (74.844 kg)  10/08/13 164 lb (74.39 kg)  10/03/13 170 lb (77.111 kg)  05/31/13 176 lb (79.833 kg)  03/05/13 170 lb (77.111 kg)  02/23/13 185 lb (83.915 kg)  10/27/12 187 lb (84.823 kg)  04/28/12 176 lb (79.833 kg)    BMI:  Body mass index is 23.42 kg/(m^2). Pt meets criteria for normal range based on current BMI.  Estimated Nutritional Needs: Kcal: 30-35 kcal/kg Protein: > 1 gram protein/kg Fluid: 1 ml/kcal  Diet Order: Diet regular Pt is also offered choice of unit snacks mid-morning and mid-afternoon.  Pt is eating as desired.   Lab results and medications reviewed.   Clayton Bibles, MS, RD, LDN Pager: [phone] After Hours Pager: [phone]

## 2014-09-01 DIAGNOSIS — F1099 Alcohol use, unspecified with unspecified alcohol-induced disorder: Secondary | ICD-10-CM

## 2014-09-01 MED ORDER — LORAZEPAM 1 MG PO TABS
1.0000 mg | ORAL_TABLET | Freq: Two times a day (BID) | ORAL | Status: AC
  Administered 2014-09-01 (×2): 1 mg via ORAL
  Filled 2014-09-01 (×2): qty 1

## 2014-09-01 MED ORDER — LORAZEPAM 1 MG PO TABS
1.0000 mg | ORAL_TABLET | ORAL | Status: DC | PRN
  Administered 2014-09-03 (×2): 1 mg via ORAL
  Filled 2014-09-01 (×2): qty 1

## 2014-09-01 MED ORDER — LORAZEPAM 1 MG PO TABS
1.0000 mg | ORAL_TABLET | Freq: Every day | ORAL | Status: AC
  Administered 2014-09-02: 1 mg via ORAL
  Filled 2014-09-01: qty 1

## 2014-09-01 MED ORDER — LORAZEPAM 1 MG PO TABS: 1.0000 mg | ORAL_TABLET | Freq: Three times a day (TID) | ORAL | Status: DC

## 2014-09-01 MED ORDER — LORAZEPAM 1 MG PO TABS: 1.0000 mg | ORAL_TABLET | Freq: Every day | ORAL | Status: DC

## 2014-09-01 MED ORDER — LORAZEPAM 1 MG PO TABS
ORAL_TABLET | ORAL | Status: AC
  Administered 2014-09-01: 23:00:00
  Filled 2014-09-01: qty 1

## 2014-09-01 MED ORDER — LORAZEPAM 1 MG PO TABS: 1.0000 mg | ORAL_TABLET | Freq: Two times a day (BID) | ORAL | Status: DC

## 2014-09-01 NOTE — BHH Group Notes (Signed)
[facility] Group Notes:  (Nursing/MHT/Case Management/Adjunct)  Date:  09/01/2014  Time:  1:48 PM  Type of Therapy:  Psychoeducational Skills  Participation Level:  Did Not Attend  Participation Quality:  Did Not Attend  Affect:  Did Not Attend  Cognitive:  Did Not Attend  Insight:  None  Engagement in Group:  Did Not Attend  Modes of Intervention:  Did Not Attend  Summary of Progress/Problems: Pt did not attend patient self inventory group.   Kamiah Fite Shanta 09/01/2014, 1:48 PM 

## 2014-09-01 NOTE — Progress Notes (Addendum)
Patient ID: Isaac [name], male   DOB: February 14, 1961, 53 y.o.   MRN: [id number]   D: Pt has been very flat and depressed on the unit today, he was in the bed most of the day. Pt on the detox protocol for withdrawals which includes tremors, agitation, and anxiety. Pt took all medication without any problems. Pt reported his depression as a 7, and his hopelessness as a 5. Pt reported being negative SI/HI, no AH/VH noted. A: 15 min checks continued for patient safety. R: Pt safety maintained.

## 2014-09-01 NOTE — Progress Notes (Signed)
[place] LCSW Group Therapy  09/01/2014 10:00 AM   Type of Therapy:  Group Therapy  Participation Level:  Did Not Attend  Regan Lemming, LCSW 09/01/2014 11:27 AM

## 2014-09-01 NOTE — Progress Notes (Signed)
Patient did not attend the evening speaker [place] meeting. Pt was invited to group but remained in his room.

## 2014-09-01 NOTE — Progress Notes (Signed)
Patient ID: Isaac [name], male   DOB: Aug 21, 1961, 53 y.o.   MRN: [id number] D)  Has been lying in bed tonight, requested hs ativan for anxiety and was given.  Stated didn't feel he could go to group this evening, and went to sleep.  Was awakened for atripla, was given gatorade and ensure, went back to sleep. A)  Will continue to monitor for safety, continue POC R)  Safety maintained.

## 2014-09-01 NOTE — Progress Notes (Signed)
Psychoeducational Group Note  Date:  09/01/2014 Time:  1015  Group Topic/Focus:  Making Healthy Choices:   The focus of this group is to help patients identify negative/unhealthy choices they were using prior to admission and identify positive/healthier coping strategies to replace them upon discharge.  Participation Level:  Active  Participation Quality:  Appropriate  Affect:  Appropriate  Cognitive:  Oriented  Insight:  Improving  Engagement in Group:  Engaged  Additional Comments:  Interacted appropriately. States that knowledge was gained  Isaac [name] A 09/01/2014  

## 2014-09-01 NOTE — Progress Notes (Signed)
Patient ID: Isaac [name], male   DOB: [birth year], 53 y.o.   MRN: [id number] [facility] MD Progress Note  09/01/2014 3:06 PM AKSH SWART  MRN:  [id number] Subjective:  Met with patient today. States he has a headache, the kind he usually gets. Says his anxiety is up because he is worrying about where he will go and what he will do upon discharge. He continues to have a CIWA of 5 today. Rates his depression is a 6/10, and his anxiety is 8/10. Objective: He has been up and going to groups, has been appropriate on the unit, and has no new concerns. Diagnosis:   DSM5: Schizophrenia Disorders:   Obsessive-Compulsive Disorders:   Trauma-Stressor Disorders:   Substance/Addictive Disorders:  Alcohol Related Disorder - Severe (303.90) Depressive Disorders:  Major Depressive Disorder - Severe (296.23) Total Time spent with patient: 15 minutes    ADL's:  Impaired  Sleep: Poor  Appetite:  Poor  Suicidal Ideation:  denies Homicidal Ideation:  denies AEB (as evidenced by):  Psychiatric Specialty Exam: Physical Exam  ROS  Blood pressure 126/94, pulse 101, temperature 98.8 F (37.1 C), temperature source Oral, resp. rate 20, height 5' 8"  (1.727 m), weight 69.854 kg (154 lb).Body mass index is 23.42 kg/(m^2).  General Appearance: Disheveled  Eye Contact::  Poor  Speech:  Slow  Volume:  Normal  Mood:  Depressed  Affect:  Congruent  Thought Process:  Goal Directed  Orientation:  Full (Time, Place, and Person)  Thought Content:  NA  Suicidal Thoughts:  No  Homicidal Thoughts:  No  Memory:  NA  Judgement:  Poor  Insight:  Shallow  Psychomotor Activity:  Tremor  Concentration:  Poor  Recall:  Poor  Fund of Knowledge:Fair  Language: Good  Akathisia:  No  Handed:  Right  AIMS (if indicated):     Assets:  Communication Skills Desire for Improvement  Sleep:  Number of Hours: 6   Musculoskeletal: Strength & Muscle Tone: within normal limits Gait & Station: normal Patient leans:  N/A  Current Medications: Current Facility-Administered Medications  Medication Dose Route Frequency Provider Last Rate Last Dose  . efavirenz-emtricitabine-tenofovir (ATRIPLA) 600-200-300 MG per tablet 1 tablet  1 tablet Oral QHS Shuvon Rankin, NP   1 tablet at 08/31/14 2255  . feeding supplement (ENSURE COMPLETE) (ENSURE COMPLETE) liquid 237 mL  237 mL Oral TID BM Clayton Bibles, RD   237 mL at 09/01/14 1250  . lisinopril (PRINIVIL,ZESTRIL) tablet 10 mg  10 mg Oral Daily Shuvon Rankin, NP   10 mg at 09/01/14 1250   And  . hydrochlorothiazide (MICROZIDE) capsule 12.5 mg  12.5 mg Oral Daily Shuvon Rankin, NP   12.5 mg at 09/01/14 1250  . ibuprofen (ADVIL,MOTRIN) tablet 600 mg  600 mg Oral Q8H PRN Shuvon Rankin, NP      . levETIRAcetam (KEPPRA) tablet 500 mg  500 mg Oral BID Shuvon Rankin, NP   500 mg at 09/01/14 1250  . LORazepam (ATIVAN) tablet 1 mg  1 mg Oral BID Nicholaus Bloom, MD   1 mg at 09/01/14 1249   Followed by  . [START ON 09/02/2014] LORazepam (ATIVAN) tablet 1 mg  1 mg Oral Daily Nicholaus Bloom, MD      . magnesium hydroxide (MILK OF MAGNESIA) suspension 30 mL  30 mL Oral Daily PRN Shuvon Rankin, NP      . nicotine (NICODERM CQ - dosed in mg/24 hours) patch 21 mg  21 mg Transdermal Daily Shuvon Rankin, NP  21 mg at 08/30/14 1003  . ondansetron (ZOFRAN) tablet 4 mg  4 mg Oral Q8H PRN Shuvon Rankin, NP      . thiamine (VITAMIN B-1) tablet 100 mg  100 mg Oral Daily Shuvon Rankin, NP   100 mg at 09/01/14 1250    Lab Results: No results found for this or any previous visit (from the past 48 hour(s)).  Physical Findings: AIMS: Facial and Oral Movements Muscles of Facial Expression: None, normal Lips and Perioral Area: None, normal Jaw: None, normal Tongue: None, normal,Extremity Movements Upper (arms, wrists, hands, fingers): None, normal Lower (legs, knees, ankles, toes): None, normal, Trunk Movements Neck, shoulders, hips: None, normal, Overall Severity Severity of abnormal  movements (highest score from questions above): None, normal Incapacitation due to abnormal movements: None, normal Patient's awareness of abnormal movements (rate only patient's report): No Awareness, Dental Status Current problems with teeth and/or dentures?: No Does patient usually wear dentures?: No  CIWA:  CIWA-Ar Total: 5 COWS:     Treatment Plan Summary: Daily contact with patient to assess and evaluate symptoms and progress in treatment Medication management  Plan: 1. Continue crisis management and stabilization. 2. Medication management to reduce current symptoms to base line and improve patient's overall level of functioning 3. Treat health problems as indicated. 4. Develop treatment plan to decrease risk of relapse upon discharge and the need for readmission. 5. Psycho-social education regarding relapse prevention and self care. 6. Health care follow up as needed for medical problems. 7. Continue home medications where appropriate. 8. Consider SSRI when patient is more stable from detox.  Medical Decision Making Problem Points:  Established problem, stable/improving (1) Data Points:  Review of medication regiment & side effects (2)  I certify that inpatient services furnished can reasonably be expected to improve the patient's condition.  Marlane Hatcher. Varina Hulon RPAC 3:06 PM 09/01/2014

## 2014-09-02 DIAGNOSIS — F1994 Other psychoactive substance use, unspecified with psychoactive substance-induced mood disorder: Secondary | ICD-10-CM

## 2014-09-02 DIAGNOSIS — F1414 Cocaine abuse with cocaine-induced mood disorder: Secondary | ICD-10-CM | POA: Diagnosis present

## 2014-09-02 DIAGNOSIS — F1020 Alcohol dependence, uncomplicated: Secondary | ICD-10-CM | POA: Diagnosis present

## 2014-09-02 LAB — HIV-1 RNA QUANT-NO REFLEX-BLD: HIV-1 RNA Quant, Log: <1.3 {Log} (ref <1.30)

## 2014-09-02 NOTE — Progress Notes (Signed)
[facility] MD Progress Note  09/02/2014 6:06 PM Isaac [name]  MRN:  [id number] Subjective:  Isaac [name] is having a hard time. States he has no one he can count on. He is feeling increasingly more anxious worried. He wants to get his life back together. He wants to quit drinking and using. Understands what his use is doing to his body in terms of his HIV, Hep C status as well as the seizures. Wants to be considered for a residential treatment program. States that he does not know if he is going to be able to make it if he does not go Diagnosis:   DSM5: Substance/Addictive Disorders:  Alcohol Related Disorder - Severe (303.90), Cocaine related disorder Depressive Disorders:  Major Depressive Disorder - Severe (296.23) Total Time spent with patient: 30 minutes  Axis I: Substance Induced Mood Disorder  ADL's:  Intact  Sleep: Poor  Appetite:  Poor Psychiatric Specialty Exam: Physical Exam  Review of Systems  Constitutional: Positive for malaise/fatigue.  HENT: Negative.   Eyes: Negative.   Respiratory: Negative.   Cardiovascular: Negative.   Gastrointestinal: Negative.   Genitourinary: Negative.   Musculoskeletal: Negative.   Skin: Negative.   Neurological: Positive for weakness.  Endo/Heme/Allergies: Negative.   Psychiatric/Behavioral: Positive for depression and substance abuse. The patient is nervous/anxious and has insomnia.     Blood pressure 102/83, pulse 97, temperature 98 F (36.7 C), temperature source Oral, resp. rate 20, height 5\' 8"  (1.727 m), weight 69.854 kg (154 lb).Body mass index is 23.42 kg/(m^2).  General Appearance: Fairly Groomed  Engineer, water::  Minimal  Speech:  Clear and Coherent, Slow and not spontaneous  Volume:  Decreased  Mood:  Anxious and Depressed  Affect:  Restricted  Thought Process:  Coherent and Goal Directed  Orientation:  Full (Time, Place, and Person)  Thought Content:  symptoms events worries concerns  Suicidal Thoughts:  No  Homicidal Thoughts:   No  Memory:  Immediate;   Fair Recent;   Fair Remote;   Fair  Judgement:  Fair  Insight:  Present  Psychomotor Activity:  Decreased  Concentration:  Fair  Recall:  [employer] [place]: Fair  Akathisia:  No  Handed:    AIMS (if indicated):     Assets:  Desire for Improvement  Sleep:  Number of Hours: 5   Musculoskeletal: Strength & Muscle Tone: within normal limits Gait & Station: normal Patient leans: N/A  Current Medications: Current Facility-Administered Medications  Medication Dose Route Frequency Provider Last Rate Last Dose  . efavirenz-emtricitabine-tenofovir (ATRIPLA) 600-200-300 MG per tablet 1 tablet  1 tablet Oral QHS Isaac Rankin, NP   1 tablet at 09/01/14 2231  . feeding supplement (ENSURE COMPLETE) (ENSURE COMPLETE) liquid 237 mL  237 mL Oral TID BM Isaac [name], RD   237 mL at 09/01/14 2031  . lisinopril (PRINIVIL,ZESTRIL) tablet 10 mg  10 mg Oral Daily Isaac Rankin, NP   10 mg at 09/02/14 8250   And  . hydrochlorothiazide (MICROZIDE) capsule 12.5 mg  12.5 mg Oral Daily Isaac Rankin, NP   12.5 mg at 09/02/14 0837  . ibuprofen (ADVIL,MOTRIN) tablet 600 mg  600 mg Oral Q8H PRN Isaac Rankin, NP      . levETIRAcetam (KEPPRA) tablet 500 mg  500 mg Oral BID Isaac Rankin, NP   500 mg at 09/02/14 1723  . LORazepam (ATIVAN) tablet 1 mg  1 mg Oral Q4H PRN Isaac Campus, MD   1 mg at 09/02/14 5397  .  magnesium hydroxide (MILK OF MAGNESIA) suspension 30 mL  30 mL Oral Daily PRN Isaac Rankin, NP      . nicotine (NICODERM CQ - dosed in mg/24 hours) patch 21 mg  21 mg Transdermal Daily Isaac Rankin, NP   21 mg at 08/30/14 1003  . ondansetron (ZOFRAN) tablet 4 mg  4 mg Oral Q8H PRN Isaac Rankin, NP      . thiamine (VITAMIN B-1) tablet 100 mg  100 mg Oral Daily Isaac Rankin, NP   100 mg at 09/02/14 0315    Lab Results: No results found for this or any previous visit (from the past 48 hour(s)).  Physical Findings: AIMS: Facial and Oral  Movements Muscles of Facial Expression: None, normal Lips and Perioral Area: None, normal Jaw: None, normal Tongue: None, normal,Extremity Movements Upper (arms, wrists, hands, fingers): None, normal Lower (legs, knees, ankles, toes): None, normal, Trunk Movements Neck, shoulders, hips: None, normal, Overall Severity Severity of abnormal movements (highest score from questions above): None, normal Incapacitation due to abnormal movements: None, normal Patient's awareness of abnormal movements (rate only patient's report): No Awareness, Dental Status Current problems with teeth and/or dentures?: No Does patient usually wear dentures?: No  CIWA:  CIWA-Ar Total: 0 COWS:     Treatment Plan Summary: Daily contact with patient to assess and evaluate symptoms and progress in treatment Medication management  Plan: Supportive approach/coping skills/relapse prevention           Alcohol/cacaine dependence; continue detox           Reassess and address the co morbid mood disorder           Optimize response to the anticonvulsant            Explore residential treatment options  Medical Decision Making Problem Points:  Review of psycho-social stressors (1) Data Points:  Review of medication regiment & side effects (2) Review of new medications or change in dosage (2)  I certify that inpatient services furnished can reasonably be expected to improve the patient's condition.   Isaac [name] A 09/02/2014, 6:06 PM

## 2014-09-02 NOTE — BHH Group Notes (Signed)
   [facility] LCSW Aftercare Discharge Planning Group Note  09/02/2014  8:45 AM   Participation Quality: Alert, Appropriate and Oriented  Mood/Affect: Depressed and Flat  Depression Rating: 6  Anxiety Rating: 8  Thoughts of Suicide: Pt denies SI/HI  Will you contract for safety? Yes  Current AVH: Pt denies  Plan for Discharge/Comments: Pt attended discharge planning group and actively participated in group. CSW provided pt with today's workbook. Patient reports not feeling well today. Patient reports being hospitalized due to substance abuse, depression, homelessness, and having seizures. Patient requesting residential treatment at this time.  Transportation Means: Pt reports access to transportation  Supports: No supports mentioned at this time  [name], MSW, [place] Social Worker [employer] [phone]

## 2014-09-02 NOTE — Clinical Social Work Note (Addendum)
Referral made to [facility].  [facility] does not have expected availability until September 25, 2014.  [name], MSW, [place] Worker [facility] [phone]

## 2014-09-02 NOTE — Clinical Social Work Note (Signed)
[facility] screening scheduled for Wednesday 12/30 at 8 am.  [name], MSW, [place] Worker [facility] [phone]

## 2014-09-02 NOTE — Progress Notes (Signed)
Patient ID: Isaac [name], male   DOB: 07-May-1961, 53 y.o.   MRN: [id number] D)  Spent most of the evening in bed, didn't want to attend group, but got up and went out to the hall, looked into the dayroom where group was being held.  I encouraged him to go in, but he refused, stated just wanted to go in when group was over and get a snack.  Went back to his room and back to bed, stated feeling anxious, didn't want to be around people.  Affect flat, minimal conversation.  Got a snack after group, went back to bed, meds were taken to him. A)  Will continue to monitor for safety, support, encourage to participate in program, work on discharge planning. R)  Safety maintained.

## 2014-09-02 NOTE — BHH Group Notes (Signed)
Per Melissa at [facility], patient will need to have CD4 levels rechecked and discontinued on ativan to be considered for admission.  [name], MSW, [place] Worker [facility] [phone]

## 2014-09-02 NOTE — BHH Group Notes (Signed)
[place] LCSW Group Therapy 09/02/2014  1:15 PM   Type of Therapy: Group Therapy  Participation Level: Did Not Attend. Patient in bed,invited to attend but declined.   [name], MSW, [place] Worker [facility] [phone]

## 2014-09-02 NOTE — Progress Notes (Signed)
D: Pt presents with flat affect and depressed mood. Pt has spent most of the morning and noon in bed. Pt has minimal interaction on the unit and little engagement with peers. Pt denies experiencing s/s of depression, withdrawals, si/hi and AVH. Pt however pt verbalized feeling depressed this morning to CSW and requested long-term tx.  A:Medications administered as ordered per MD. Verbal support given. Writer encouraged pt to attend morning discharge group with CSW.. 15 minute checks performed for safety.  R: Pt forwards little information during shift assessment. Pt appears to be minimizing his s/s this morning.

## 2014-09-03 MED ORDER — LISINOPRIL-HYDROCHLOROTHIAZIDE 10-12.5 MG PO TABS: 1.0000 | ORAL_TABLET | Freq: Every day | ORAL | Status: DC

## 2014-09-03 MED ORDER — EFAVIRENZ-EMTRICITAB-TENOFOVIR 600-200-300 MG PO TABS: ORAL_TABLET | ORAL | Status: DC

## 2014-09-03 MED ORDER — LEVETIRACETAM 500 MG PO TABS: 500.0000 mg | ORAL_TABLET | Freq: Two times a day (BID) | ORAL | Status: DC

## 2014-09-03 NOTE — Discharge Summary (Signed)
Physician Discharge Summary Note  Patient:  Isaac [name] is an 53 y.o., male MRN:  [id number] DOB:  07/13/1961 Patient phone:  [phone] (home)  Patient address:   [place] [address],  Total Time spent with patient: Greater than 30 minutes  Date of Admission:  08/29/2014 Date of Discharge: 09/04/14  Reason for Admission:  Alcohol detox  Discharge Diagnoses: Principal Problem:   S/P alcohol detoxification Active Problems:   Alcohol dependence   Cocaine abuse with cocaine-induced mood disorder   Substance induced mood disorder   Psychiatric Specialty Exam: Physical Exam  Psychiatric: His speech is normal and behavior is normal. Judgment and thought content normal. His mood appears not anxious. His affect is not angry, not blunt, not labile and not inappropriate. Cognition and memory are normal. He does not exhibit a depressed mood.    Review of Systems  Constitutional: Negative.   HENT: Negative.   Eyes: Negative.   Respiratory: Negative.   Cardiovascular: Negative.   Gastrointestinal: Negative.   Genitourinary: Negative.   Musculoskeletal: Negative.   Skin: Negative.   Neurological: Negative.   Endo/Heme/Allergies: Negative.   Psychiatric/Behavioral: Positive for depression (Stable) and substance abuse (Alcoholism, benzodiazepine abuse, caocaine abuse). Negative for suicidal ideas, hallucinations and memory loss. The patient is not nervous/anxious and does not have insomnia.     Blood pressure 113/81, pulse 81, temperature 98.6 F (37 C), temperature source Oral, resp. rate 18, height 5\' 8"  (1.727 m), weight 69.854 kg (154 lb).Body mass index is 23.42 kg/(m^2).  See Md's SRA               Past Psychiatric History: Diagnosis: Alcohol dependence, Cocaine abuse  Hospitalizations: None reported  Outpatient Care: None reported  Substance Abuse Care: None reported  Self-Mutilation: Denies  Suicidal Attempts: Denies  Violent Behaviors: Denies    Musculoskeletal: Strength & Muscle Tone: within normal limits Gait & Station: normal Patient leans: N/A  DSM5: Schizophrenia Disorders:  NA Obsessive-Compulsive Disorders:  NA Trauma-Stressor Disorders:  NA Substance/Addictive Disorders:  Alcohol Related Disorder - Severe (303.90) Depressive Disorders: Substabance induced mood disorde4r  Axis Diagnosis:  AXIS I:  Alcohol dependence, Substance induced mood disorder AXIS II:  Deferred AXIS III:   Past Medical History  Diagnosis Date  . Hypertension   . HIV (human immunodeficiency virus infection)   . Seizures   . Immune deficiency disorder   . Hepatitis C    AXIS IV:  other psychosocial or environmental problems and Alcoholism, chronic AXIS V:  63  Level of Care:  [facility]  Hospital Course:  Patient presented to the ED reporting a history of seizures with SI due to being off of his medication for two weeks. He notes that he has been drinking daily for over a month, and using as much crack cocaine as he could get. He states the SI has worsened with the increasing seizures, but he has not plans to harm himself at this time.  Isaac [name] was admitted to the hospital for alcohol detoxification treatments. His blood alcohol level upon admission was 109 per toxicology tests reports, UDS test reports positive for Benzodiazepine & cocaine. He was intoxicated. Isaac [name]'s lab reports also indicated elevated liver enzymes from chronic alcoholism. As a result, not a candidate for Librium detox protocols. This is because, Librium is a long acting Benzodiazepine with a long half-life. If used for this particular detox treatment will impose heavily on already compromised liver functions. By using Isaac [name] received a cleaner detoxification treatment without endangering  his liver fuctions any further.  Besides the detox treatment, Isaac [name] also was resumed and discharged on his other pertinent home medications for his other pre-existing medical issues. He  tolerated his treatment regimen without any significant adverse effects and or reactions reported. He was enrolled and participated in the group counseling sessions being offered & held on this unit. He learned coping skills.  Isaac [name] has completed detox treatment and his mood is stable. He is currently being discharged to the [facility] to continue substance abuse treatment. He has been given all the necessary information needed to make this appointment without problems. Upon discharge, he adamantly denies any SIHI, AVH, delusional thoughts, paranoia and or withdrawal symptoms. He received some samples of his discharge medicines from the [facility]. He left [facility] with all personal belongings in no distress. Transportation per city bus. Isaac [name] assisted with bus fare.   Consults:  psychiatry  Upon discharge, Isaac [name] adamantly denies any suicidal, homicidal ideations, auditory, visual hallucinations, delusional thoughts, paranoia and or withdrawal symptoms. He left [name] with all personal belongings in no apparent distress. He received 4 days worth supply samples of his discharge medications provided by [facility] pharmacy. Transportation per city bus. Bus pass provided by [facility].  Consults:  psychiatry  Significant Diagnostic Studies:  labs: CBC with diff, CMP, UDS, toxicology tests, u/A, results reviewed, stable  Discharge Vitals:   Blood pressure 113/81, pulse 81, temperature 98.6 F (37 C), temperature source Oral, resp. rate 18, height 5\' 8"  (1.727 m), weight 69.854 kg (154 lb). Body mass index is 23.42 kg/(m^2). Lab Results:   No results found for this or any previous visit (from the past 72 hour(s)).  Physical Findings: AIMS: Facial and Oral Movements Muscles of Facial Expression: None, normal Lips and Perioral Area: None, normal Jaw: None, normal Tongue: None, normal,Extremity Movements Upper (arms, wrists, hands, fingers): None, normal Lower (legs, knees, ankles, toes): None, normal, Trunk  Movements Neck, shoulders, hips: None, normal, Overall Severity Severity of abnormal movements (highest score from questions above): None, normal Incapacitation due to abnormal movements: None, normal Patient's awareness of abnormal movements (rate only patient's report): No Awareness, Dental Status Current problems with teeth and/or dentures?: No Does patient usually wear dentures?: No  CIWA:  CIWA-Ar Total: 2 COWS:     Psychiatric Specialty Exam: See Psychiatric Specialty Exam and Suicide Risk Assessment completed by Attending Physician prior to discharge.  Discharge destination:  [facility]  Is patient on multiple antipsychotic therapies at discharge:  No   Has Patient had three or more failed trials of antipsychotic monotherapy by history:  No  Recommended Plan for Multiple Antipsychotic Therapies: NA    Medication List    STOP taking these medications        butalbital-acetaminophen-caffeine 50-325-40 MG per tablet  Commonly known as:  FIORICET     sildenafil 100 MG tablet  Commonly known as:  VIAGRA      TAKE these medications      Indication   efavirenz-emtricitabine-tenofovir 600-200-300 MG per tablet  Commonly known as:  ATRIPLA  TAKE 1 TABLET BY MOUTH DAILY: For HIV infection   Indication:  HIV Disease     levETIRAcetam 500 MG tablet  Commonly known as:  KEPPRA  Take 1 tablet (500 mg total) by mouth 2 (two) times daily. For seizures   Indication:  Seizures     lisinopril-hydrochlorothiazide 10-12.5 MG per tablet  Commonly known as:  PRINZIDE,ZESTORETIC  Take 1 tablet by mouth daily. For high blood pressure  Indication:  High Blood Pressure       Follow-up Information    Follow up with [facility] On 09/04/2014.   Why:  Admissions screening on Wednesday Dec. 30th at 8 am for possible admission. Please call office if you need to reschedule.   Contact information:   [address] [place] [place], [place] [address] [phone]      Follow-up recommendations: Activity:  As tolerated Diet: As recommended by your primary care doctor. Keep all scheduled follow-up appointments as recommended.   Comments: Take all your medications as prescribed by your mental healthcare provider. Report any adverse effects and or reactions from your medicines to your outpatient provider promptly. Patient is instructed and cautioned to not engage in alcohol and or illegal drug use while on prescription medicines. In the event of worsening symptoms, patient is instructed to call the crisis hotline, 911 and or go to the nearest ED for appropriate evaluation and treatment of symptoms. Follow-up with your primary care provider for your other medical issues, concerns and or health care needs.   Total Discharge Time:  Greater than 30 minutes.  Signed: Encarnacion Slates, PMHNP, FNP-BC 09/04/2014, 3:12 PM  I personally assessed the patient and formulated the plan Geralyn Flash A. Sabra Heck, M.D.

## 2014-09-03 NOTE — Tx Team (Signed)
Interdisciplinary Treatment Plan Update (Adult) Date: 09/03/2014   Time Reviewed: 9:30 AM  Progress in Treatment: Attending groups: Minimally Participating in groups: Minimally Taking medication as prescribed: Yes Tolerating medication: Yes Family/Significant other contact made: No, patient has declined collateral contact Patient understands diagnosis: Yes Discussing patient identified problems/goals with staff: Yes Medical problems stabilized or resolved: Yes Denies suicidal/homicidal ideation: Yes, denies Issues/concerns per patient self-inventory: Yes Other:  New problem(s) identified: N/A  Discharge Plan or Barriers: Patient is homeless and has limited social supports. Patient is requesting residential treatment at discharge.  Reason for Continuation of Hospitalization:  Depression Anxiety Medication Stabilization   Comments: N/A  Estimated length of stay: Discharge anticipated for tomorrow 09/04/14  For review of initial/current patient goals, please see plan of care.  12/29: Patient is agreeable to discharge Wednesday morning for [facility] Admissions Screening.   Attendees: Patient:    Family:    Physician: Dr. Sabra Heck 09/03/2014 9:30 AM  Nursing: Eduard Roux; Forest Becker, RN 09/03/2014 9:30 AM  Clinical Social Worker: [name],  LCSWA 09/03/2014 9:30 AM  Other: Joette Catching, LCSW 09/03/2014 9:30 AM  Other: Beather Arbour, Director 09/03/2014 9:30 AM  Other: Edwyna Shell, LCSW 09/03/2014 9:30 AM  Other: Loretha Brasil, NP 09/03/2014 9:30 AM  Other:    Other:    Other:    Other:    Other:     Scribe for Treatment Team:  [name], MSW, [employer] [phone]

## 2014-09-03 NOTE — [facility] Suicide Risk Assessment (Signed)
[place] INPATIENT:  Family/Significant Other Suicide Prevention Education  Suicide Prevention Education:  Patient Refusal for Family/Significant Other Suicide Prevention Education: The patient Isaac [name] has refused to provide written consent for family/significant other to be provided Family/Significant Other Suicide Prevention Education during admission and/or prior to discharge.  Physician notified. SPE reviewed with patient and brochure provided.  Isaac [name], Casimiro Needle 09/03/2014, 8:44 AM

## 2014-09-03 NOTE — Progress Notes (Signed)
Adult Psychoeducational Group Note  Date:  09/03/2014 Time:  1000  Group Topic/Focus:  Recovery Goals:   The focus of this group is to identify appropriate goals for recovery and establish a plan to achieve them.  Participation Level:  Did Not Attend  Participation Quality:    Affect:    Cognitive:    Insight:   Engagement in Group:    Modes of Intervention:    Additional Comments:    Isaac [name] L 09/03/2014, 12:44 PM

## 2014-09-03 NOTE — [facility] Group Notes (Signed)
Adult Psychoeducational Group Note  Date:  09/03/2014 Time:  9:34 PM  Group Topic/Focus:  Wrap-Up Group:   The focus of this group is to help patients review their daily goal of treatment and discuss progress on daily workbooks.  Participation Level:  Active  Participation Quality:  Appropriate  Affect:  Blunted  Cognitive:  Alert, Appropriate and Oriented  Insight: Improving  Engagement in Group:  Improving  Modes of Intervention:  Discussion and Support  Additional Comments:  Pt stated that today was a depressing day for him that he has been battling involuntary thoughts about being here, being homeless, drugs/alcohol. Today was really hard for the pt pt stated that he was really hard on himself wishing that he would die and just felt like giving up. But pt reminded himself that he has felt this way before and that there are others in way worse of situations. Pt also stated that he knows that these misfortunes are a result of his own actions and decisions. Pt reported that his day has gotten better and rated it a 5 out of 10 and reports he is grateful to be alive.   Flossie Dibble 09/03/2014, 9:34 PM

## 2014-09-03 NOTE — [facility] Suicide Risk Assessment (Signed)
Suicide Risk Assessment  Discharge Assessment     Demographic Factors:  Male  Total Time spent with patient: 30 minutes  Psychiatric Specialty Exam:     Blood pressure 113/81, pulse 81, temperature 98.6 F (37 C), temperature source Oral, resp. rate 18, height 5\' 8"  (1.727 m), weight 69.854 kg (154 lb).Body mass index is 23.42 kg/(m^2).  General Appearance: Fairly Groomed  Engineer, water::  Fair  Speech:  Clear and Coherent and not spontaneous  Volume:  Decreased  Mood:  Euthymic  Affect:  Restricted  Thought Process:  Coherent and Goal Directed  Orientation:  Full (Time, Place, and Person)  Thought Content:  plans as he moves on  Suicidal Thoughts:  No  Homicidal Thoughts:  No  Memory:  Immediate;   Fair Recent;   Fair Remote;   Fair  Judgement:  Fair  Insight:  Fair  Psychomotor Activity:  Decreased  Concentration:  Fair  Recall:  [employer] [place]: Fair  Akathisia:  No  Handed:    AIMS (if indicated):     Assets:  Desire for Improvement  Sleep:  Number of Hours: 5.5    Musculoskeletal: Strength & Muscle Tone: within normal limits Gait & Station: normal Patient leans: N/A   Mental Status Per Nursing Assessment::   On Admission:  Self-harm thoughts  Current Mental Status by Physician: In full contact with reality. There are no active S/S of withdrawal. There are no active SI plans or intent. He is willing and motivated to pursue residential treatment trough [facility]   Loss Factors: NA  Historical Factors: NA  Risk Reduction Factors:   resilient  Continued Clinical Symptoms:  Alcohol/Substance Abuse/Dependencies  Cognitive Features That Contribute To Risk:  Closed-mindedness Polarized thinking Thought constriction (tunnel vision)    Suicide Risk:  Minimal: No identifiable suicidal ideation.  Patients presenting with no risk factors but with morbid ruminations; may be classified as minimal risk based on the severity of the  depressive symptoms  Discharge Diagnoses:   AXIS I:  Alcohol Dependence, Cocaine Dependence, Substance Induced Mood Disorder AXIS II:  No diagnosis AXIS III:   Past Medical History  Diagnosis Date  . Hypertension   . HIV (human immunodeficiency virus infection)   . Seizures   . Immune deficiency disorder   . Hepatitis C    AXIS IV:  other psychosocial or environmental problems AXIS V:  61-70 mild symptoms  Plan Of Care/Follow-up recommendations:  Activity:  as tolerated Diet:  regular Follow up [facility] Is patient on multiple antipsychotic therapies at discharge:  No   Has Patient had three or more failed trials of antipsychotic monotherapy by history:  No  Recommended Plan for Multiple Antipsychotic Therapies: NA    Maryetta [place] A 09/03/2014, 5:01 PM

## 2014-09-03 NOTE — Progress Notes (Signed)
Pt attended the evening AA speaker meeting. Pt did not appear to be engaged. Pt had his head covered, with his head in his hands and appeared to be sleeping or drowsy.

## 2014-09-03 NOTE — Progress Notes (Signed)
[facility] MD Progress Note  09/03/2014 4:51 PM Isaac [name]  MRN:  [id number] Subjective:  Saivion states he is getting to feel better. He is still endorsing that he he has no one, no support. He is planning to go to [facility] and afterwards not sure what he is going to do. States that he is committed to abstinence. Worried about the effect of his using on his medical conditions Diagnosis:   DSM5: Substance/Addictive Disorders:  Alcohol Related Disorder - Severe (303.90), Cocaine Use Disorder Total Time spent with patient: 30 minutes  Axis I: Substance Induced Mood Disorder  ADL's:  Intact  Sleep: Fair  Appetite:  Fair  Psychiatric Specialty Exam: Physical Exam  Review of Systems  Constitutional: Negative.   HENT: Negative.   Eyes: Negative.   Respiratory: Negative.   Cardiovascular: Negative.   Gastrointestinal: Negative.   Genitourinary: Negative.   Musculoskeletal: Negative.   Skin: Negative.   Neurological: Negative.   Endo/Heme/Allergies: Negative.   Psychiatric/Behavioral: Positive for substance abuse. The patient is nervous/anxious.     Blood pressure 113/81, pulse 81, temperature 98.6 F (37 C), temperature source Oral, resp. rate 18, height 5\' 8"  (1.727 m), weight 69.854 kg (154 lb).Body mass index is 23.42 kg/(m^2).  General Appearance: Fairly Groomed  Engineer, water::  Fair  Speech:  Clear and Coherent  Volume:  Decreased  Mood:  Euthymic  Affect:  Restricted  Thought Process:  Coherent and Goal Directed  Orientation:  Full (Time, Place, and Person)  Thought Content:  plans as he moves on, relapse prevention plan  Suicidal Thoughts:  No  Homicidal Thoughts:  No  Memory:  Immediate;   Fair Recent;   Fair Remote;   Fair  Judgement:  Fair  Insight:  Present  Psychomotor Activity:  Decreased  Concentration:  Fair  Recall:  [employer] of [place]:Fair  Language: Fair  Akathisia:  No  Handed:    AIMS (if indicated):     Assets:  Desire for Improvement  Sleep:   Number of Hours: 5.5   Musculoskeletal: Strength & Muscle Tone: within normal limits Gait & Station: normal Patient leans: N/A  Current Medications: Current Facility-Administered Medications  Medication Dose Route Frequency Provider Last Rate Last Dose  . efavirenz-emtricitabine-tenofovir (ATRIPLA) 600-200-300 MG per tablet 1 tablet  1 tablet Oral QHS Shuvon Rankin, NP   1 tablet at 09/02/14 2108  . feeding supplement (ENSURE COMPLETE) (ENSURE COMPLETE) liquid 237 mL  237 mL Oral TID BM Clayton Bibles, RD   237 mL at 09/01/14 2031  . lisinopril (PRINIVIL,ZESTRIL) tablet 10 mg  10 mg Oral Daily Shuvon Rankin, NP   10 mg at 09/03/14 0919   And  . hydrochlorothiazide (MICROZIDE) capsule 12.5 mg  12.5 mg Oral Daily Shuvon Rankin, NP   12.5 mg at 09/03/14 0919  . ibuprofen (ADVIL,MOTRIN) tablet 600 mg  600 mg Oral Q8H PRN Shuvon Rankin, NP   600 mg at 09/03/14 0919  . levETIRAcetam (KEPPRA) tablet 500 mg  500 mg Oral BID Shuvon Rankin, NP   500 mg at 09/03/14 0919  . LORazepam (ATIVAN) tablet 1 mg  1 mg Oral Q4H PRN Jenne Campus, MD   1 mg at 09/03/14 0919  . magnesium hydroxide (MILK OF MAGNESIA) suspension 30 mL  30 mL Oral Daily PRN Shuvon Rankin, NP      . nicotine (NICODERM CQ - dosed in mg/24 hours) patch 21 mg  21 mg Transdermal Daily Shuvon Rankin, NP   21 mg at  08/30/14 1003  . ondansetron (ZOFRAN) tablet 4 mg  4 mg Oral Q8H PRN Shuvon Rankin, NP   4 mg at 09/02/14 2111  . thiamine (VITAMIN B-1) tablet 100 mg  100 mg Oral Daily Shuvon Rankin, NP   100 mg at 09/03/14 0800    Lab Results: No results found for this or any previous visit (from the past 48 hour(s)).  Physical Findings: AIMS: Facial and Oral Movements Muscles of Facial Expression: None, normal Lips and Perioral Area: None, normal Jaw: None, normal Tongue: None, normal,Extremity Movements Upper (arms, wrists, hands, fingers): None, normal Lower (legs, knees, ankles, toes): None, normal, Trunk Movements Neck,  shoulders, hips: None, normal, Overall Severity Severity of abnormal movements (highest score from questions above): None, normal Incapacitation due to abnormal movements: None, normal Patient's awareness of abnormal movements (rate only patient's report): No Awareness, Dental Status Current problems with teeth and/or dentures?: No Does patient usually wear dentures?: No  CIWA:  CIWA-Ar Total: 2 COWS:     Treatment Plan Summary: Daily contact with patient to assess and evaluate symptoms and progress in treatment Medication management  Plan: Supportive approach/coping skills/relapse prevention           Alcohol/Drug dependence: complete the detox           Work a relapse prevention plan           Facilitate admission to [facility] in the morning  Medical Decision Making Problem Points:  Review of psycho-social stressors (1) Data Points:  Review of medication regiment & side effects (2)  I certify that inpatient services furnished can reasonably be expected to improve the patient's condition.   Seferina Brokaw A 09/03/2014, 4:51 PM

## 2014-09-03 NOTE — Progress Notes (Signed)
Patient ID: Isaac [name], male   DOB: 1961/08/02, 53 y.o.   MRN: [id number] D)  Has been up and more active on the hall this evening, attended group, came out to the nursing station to tell tech he had a headache.  Came back after group and was angry that he hadn't been called out of group for medication.  Irritable, minimal interaction, short and rude, got a snack after his meds and went to bed. A)  Will continue to monitor for safety, continue POC R)  Safety maintained

## 2014-09-03 NOTE — [facility] Group Notes (Signed)
[facility] LCSW Group Therapy 09/03/2014  1:15 PM   Type of Therapy: Group Therapy  Participation Level: Did Not Attend. Patient invited to attend but declined.   Charleston Hankin, MSW, LCSWA Clinical Social Worker [facility] [phone]   

## 2014-09-03 NOTE — Progress Notes (Signed)
Recreation Therapy Notes  Animal-Assisted Activity/Therapy (AAA/T) Program Checklist/Progress Notes Patient Eligibility Criteria Checklist & Daily Group note for Rec Tx Intervention  Date: 12.29.2015 Time: 2:45pm Location: [address]   AAA/T Program Assumption of Risk Form signed by Patient/ or Parent Legal Guardian yes  Patient is free of allergies or sever asthma yes  Patient reports no fear of animals yes  Patient reports no history of cruelty to animals yes  Patient understands his/her participation is voluntary yes  Behavioral Response: Did not attend.   Laureen Ochs Vyla Pint, LRT/CTRS  Carolynn Tuley L 09/03/2014 4:00 PM

## 2014-09-03 NOTE — Progress Notes (Signed)
Patient ID: Isaac [name], male   DOB: 01/25/1961, 53 y.o.   MRN: [id number] D: Patient alert and cooperative. Pt reports he was not fully awake when the SW told him about his discharge plans. Pt stated he needs to retreive personal items from his sister house before he goes to [facility]. Pt interaction has been minimal and anxious. Pt denies any withdrawal symptoms.  No acute distressed noted at this time.   A: Medications administered as prescribed. Emotional support given and will continue to monitor pt's progress.  R: Patient remains safe and complaint with medications. Pt denies SI/HI/AVH and pain. Pt anxious and has been in bed most of the evening.

## 2014-09-03 NOTE — Clinical Social Work Note (Signed)
CSW provided patient with bus directions and bus pass to [facility] in the morning. Patient verbalized his understanding, has no further questions at this time.  [name], MSW, [place] Worker [facility] [phone]

## 2014-09-04 NOTE — Plan of Care (Signed)
Problem: Diagnosis: Increased Risk For Suicide Attempt Goal: LTG-Patient Will Report Absence of Withdrawal Symptoms LTG (by discharge): Patient will report absence of withdrawal symptoms.  Outcome: Adequate for Discharge [facility] denies absence of withdrawal symptoms.

## 2014-09-04 NOTE — Progress Notes (Signed)
D: Patient appears calm and cooperative, denies SI/HI  at this time.    A: All personal items in locker returned to patient.  Pt given am medications. Patient  provided with discharge instructions and verbalized understanding.  R: Patient states he will comply with OP services and medications as prescribed. Patient escorted to lobby.

## 2014-09-05 ENCOUNTER — Encounter (HOSPITAL_COMMUNITY): Payer: Self-pay | Admitting: Emergency Medicine

## 2014-09-05 ENCOUNTER — Telehealth: Payer: Self-pay | Admitting: *Deleted

## 2014-09-05 ENCOUNTER — Emergency Department (HOSPITAL_COMMUNITY)
Admission: EM | Admit: 2014-09-05 | Discharge: 2014-09-05 | Disposition: A | Discharge status: Home / Self Care | Payer: Self-pay | Attending: Emergency Medicine | Admitting: Emergency Medicine

## 2014-09-05 DIAGNOSIS — Z21 Asymptomatic human immunodeficiency virus [HIV] infection status: Secondary | ICD-10-CM | POA: Insufficient documentation

## 2014-09-05 DIAGNOSIS — Z862 Personal history of diseases of the blood and blood-forming organs and certain disorders involving the immune mechanism: Secondary | ICD-10-CM | POA: Insufficient documentation

## 2014-09-05 DIAGNOSIS — F1310 Sedative, hypnotic or anxiolytic abuse, uncomplicated: Secondary | ICD-10-CM | POA: Insufficient documentation

## 2014-09-05 DIAGNOSIS — F1910 Other psychoactive substance abuse, uncomplicated: Secondary | ICD-10-CM

## 2014-09-05 DIAGNOSIS — F1410 Cocaine abuse, uncomplicated: Secondary | ICD-10-CM | POA: Insufficient documentation

## 2014-09-05 DIAGNOSIS — Z8619 Personal history of other infectious and parasitic diseases: Secondary | ICD-10-CM | POA: Insufficient documentation

## 2014-09-05 DIAGNOSIS — F1210 Cannabis abuse, uncomplicated: Secondary | ICD-10-CM | POA: Insufficient documentation

## 2014-09-05 DIAGNOSIS — Z720 Tobacco use: Secondary | ICD-10-CM | POA: Insufficient documentation

## 2014-09-05 DIAGNOSIS — G40909 Epilepsy, unspecified, not intractable, without status epilepticus: Secondary | ICD-10-CM | POA: Insufficient documentation

## 2014-09-05 DIAGNOSIS — I10 Essential (primary) hypertension: Secondary | ICD-10-CM | POA: Insufficient documentation

## 2014-09-05 LAB — ETHANOL

## 2014-09-05 LAB — COMPREHENSIVE METABOLIC PANEL
ALT: 136 U/L — ABNORMAL HIGH (ref 0–53)
AST: 153 U/L — ABNORMAL HIGH (ref 0–37)
Albumin: 4 g/dL (ref 3.5–5.2)
Alkaline Phosphatase: 69 U/L (ref 39–117)
Anion gap: 8 (ref 5–15)
BUN: 27 mg/dL — AB (ref 6–23)
CALCIUM: 8.8 mg/dL (ref 8.4–10.5)
CO2: 28 mmol/L (ref 19–32)
Chloride: 106 mEq/L (ref 96–112)
Creatinine, Ser: 1.27 mg/dL (ref 0.50–1.35)
GFR calc non Af Amer: 63 mL/min — ABNORMAL LOW (ref >90)
GFR, EST AFRICAN AMERICAN: 73 mL/min — AB (ref >90)
GLUCOSE: 83 mg/dL (ref 70–99)
Potassium: 4.7 mmol/L (ref 3.5–5.1)
SODIUM: 142 mmol/L (ref 135–145)
TOTAL PROTEIN: 8.1 g/dL (ref 6.0–8.3)
Total Bilirubin: 0.7 mg/dL (ref 0.3–1.2)

## 2014-09-05 LAB — CBC WITH DIFFERENTIAL/PLATELET
BASOS ABS: 0 10*3/uL (ref 0.0–0.1)
BASOS PCT: 0 % (ref 0–1)
EOS ABS: 0.1 10*3/uL (ref 0.0–0.7)
Eosinophils Relative: 1 % (ref 0–5)
HCT: 40.5 % (ref 39.0–52.0)
Hemoglobin: 13.1 g/dL (ref 13.0–17.0)
Lymphocytes Relative: 36 % (ref 12–46)
Lymphs Abs: 2.7 10*3/uL (ref 0.7–4.0)
MCH: 28.5 pg (ref 26.0–34.0)
MCHC: 32.3 g/dL (ref 30.0–36.0)
MCV: 88.2 fL (ref 78.0–100.0)
Monocytes Absolute: 1 10*3/uL (ref 0.1–1.0)
Monocytes Relative: 13 % — ABNORMAL HIGH (ref 3–12)
Neutro Abs: 3.8 10*3/uL (ref 1.7–7.7)
Neutrophils Relative %: 50 % (ref 43–77)
PLATELETS: 340 10*3/uL (ref 150–400)
RBC: 4.59 MIL/uL (ref 4.22–5.81)
RDW: 12.9 % (ref 11.5–15.5)
WBC: 7.6 10*3/uL (ref 4.0–10.5)

## 2014-09-05 LAB — RAPID URINE DRUG SCREEN, HOSP PERFORMED
AMPHETAMINES: NOT DETECTED
BARBITURATES: NOT DETECTED
BENZODIAZEPINES: POSITIVE — AB
COCAINE: POSITIVE — AB
OPIATES: NOT DETECTED
TETRAHYDROCANNABINOL: POSITIVE — AB

## 2014-09-05 NOTE — Telephone Encounter (Signed)
Patient notified of appointment for elastography on 09/24/14 at 8:45 AM at [facility].  Myrtis Hopping CMA

## 2014-09-05 NOTE — ED Notes (Addendum)
Per patient "If I stay out on the streets, I relapse. Im trying to see if I can be admitted to behavioral health until Monday when I get admitted to the other place." Patient states he got drunk and blacked out last night. Patient denies SI/HI.

## 2014-09-05 NOTE — BH Assessment (Signed)
TTS Erasmo Downer informed TTS [place] of the consult.

## 2014-09-05 NOTE — Discharge Instructions (Signed)
Drug Abuse and Addiction in Sports °There are many types of drugs that one may become addicted to including illegal drugs (marijuana, cocaine, amphetamines, hallucinogens, and narcotics), prescription drugs (hydrocodone, codeine, and alprazolam), and other chemicals such as alcohol or nicotine. °Two types of addiction exist: physical and emotional. Physical addiction usually occurs after prolonged use of a drug. However, some drugs may only take a couple uses before addiction can occur. Physical addiction is marked by withdrawal symptoms in which the person experiences negative symptoms such as sweat, anxiety, tremors, hallucinations, or cravings in the absence of using the drug. Emotional dependence is the psychological desire for the "high" that the drugs produce when taken. °SYMPTOMS  °· Inattentiveness. °· Negligence. °· Forgetfulness. °· Insomnia. °· Mood swings. °RISK INCREASES WITH:  °· Family history of addiction. °· Personal history of addictive personality. °Studies have shown that risk takers, which many athletes are, have a higher risk of addiction. °PREVENTION °The only adequate prevention of drug abuse is abstinence from drugs. °TREATMENT  °The first step in quitting substance abuse is recognizing the problem and realizing that one has the power to change. Quitting requires a plan and support from others. It is often necessary to seek medical assistance. Caregivers are available to offer counseling, and for certain cases, medicine to diminish the physical symptoms of withdrawal. Many organizations exist such as Alcoholics Anonymous, Narcotics Anonymous, or the National Council on Alcoholism that offer support for individuals who have chosen to quit their habits. °Document Released: 08/23/2005 Document Revised: 01/07/2014 Document Reviewed: 12/05/2008 °ExitCare® Patient Information ©2015 [employer]. This information is not intended to replace advice given to you by your health care provider. Make  sure you discuss any questions you have with your health care provider. ° °

## 2014-09-05 NOTE — ED Notes (Signed)
Pt not in room when RN attempted to discharge.

## 2014-09-05 NOTE — ED Notes (Signed)
Pt not in room. Pt's belongings not in room. Pt assumed to have left. Pt left prior to receiving discharge instructions and discharge paperwork. Pt left prior to having discharge vital signs or pain score measured.

## 2014-09-05 NOTE — ED Notes (Signed)
Pt states he is here for ETOH detox, last drink was last night. C/o headache, sore joints.

## 2014-09-05 NOTE — BH Assessment (Signed)
Writer received a call requesting a TTS consult for Mr Isaac.Cecille [name]. Writer discussed clinicals with examining PA, Gertie Fey prior to assessing patient. Based on my conversation with Gertie Fey patient would not benefit from a TTS consult. Patient is not suicidal, homicidal, and/or experiencing any psychotic symptoms. Bowie asked that this writer speaks to patient briefly prior to discharging him from the ED today.  Writer spoke with patient. He explains that he was scheduled to start treatment at [place]. Unfortunately, patient missed his appointment after misunderstanding what time he should have been at [facility].  Patient showed up late to [facility] and was told that he couldn't start the program until Monday 8am. Patient sts that he has already relapsed and afraid that he will continue relapsing. Patient asked if he could go back into detox until Monday. Writer explained to patient that he did not meet criteria for detox due to 1 day of relapsing.   Writer did however offer assistance with housing. Writer contacted [facility] Address: [address] [place], [place] [address]  [phone]. Write Probation officer confirmed with staff at the facility [facility] that a bed will be available to this patient. Izell Tutuilla sts that as long as patient is not a registered sex offender and has 2 forms of ID he would be ok to come to the mens shelter. Patient verified that he was not a registered sex offender. Patient stated that he had two forms of ID. Writer contacted [employer] and requested a taxi voucher which was granted.   Discussed plan with Gertie Fey, PA and he agreed to terminate the TTS consult.

## 2014-09-05 NOTE — ED Provider Notes (Signed)
CSN: [id number]     Arrival date & time 09/05/14  1218 History  This chart was scribed for non-physician practitioner, Domenic Moras, PA-C, working with Ephraim Hamburger, MD, by Delphia Grates, ED Scribe. This patient was seen in room WTR3/WLPT3 and the patient's care was started at 12:40 PM.    Chief Complaint  Patient presents with  . ETOH detox    The history is provided by the patient. No language interpreter was used.     HPI Comments: Isaac [name] is a 53 y.o. male, with history of HTN, seizures, HIV, and Hepatitis C, who presents to the Emergency Department for medical clearance. Patient states he was released from [facility] yesterday and was to proceed to [facility] after his release. However, he states that he was not aware he needed to be at [facility] [place]. Patient states that was not released from [facility] until 8am and by the time he commuted to his destination, he was informed that he arrived too late and was rescheduled for January 4th. Patient states that he is homeless and reports history of EtOH consumption and cocaine abuse. He notes that these events caused him become depressed and roam the streets, last night. He states he "relapsed" and endorses EtOH consumption with associated "uncontrollable" vomiting. He also notes "excruciating" HA, abdominal cramping, joint stiffness. He reports history of seizures and notes that he also "blacked out". He states he needs to "make it to Monday (January 4th)" and is requesting admission to [facility] until he can be admitted to [facility]. He denies SI/HI or A/V hallucinations.   Past Medical History  Diagnosis Date  . Hypertension   . HIV (human immunodeficiency virus infection)   . Seizures   . Immune deficiency disorder   . Hepatitis C    No past surgical history on file. No family history on file. History  Substance Use Topics  . Smoking status: Current Every Day Smoker -- 0.50 packs/day for 35 years    Types: Cigarettes    Start date: 10/28/2011   . Smokeless tobacco: Never Used  . Alcohol Use: 0.0 oz/week     Comment: occasional    Review of Systems  Constitutional: Negative for fever.  Gastrointestinal: Positive for nausea, vomiting and abdominal pain (cramping).  Neurological: Positive for headaches.  Psychiatric/Behavioral: Positive for dysphoric mood. Negative for suicidal ideas and hallucinations.      Allergies  Review of patient's allergies indicates no known allergies.  Home Medications   Prior to Admission medications   Medication Sig Start Date End Date Taking? Authorizing Provider  efavirenz-emtricitabine-tenofovir (ATRIPLA) [phone] MG per tablet TAKE 1 TABLET BY MOUTH DAILY: For HIV infection 09/03/14  Yes Encarnacion Slates, NP  levETIRAcetam (KEPPRA) 500 MG tablet Take 1 tablet (500 mg total) by mouth 2 (two) times daily. For seizures 09/03/14  Yes Encarnacion Slates, NP  lisinopril-hydrochlorothiazide (PRINZIDE,ZESTORETIC) 10-12.5 MG per tablet Take 1 tablet by mouth daily. For high blood pressure 09/03/14  Yes Encarnacion Slates, NP   Triage Vitals: BP 110/74 mmHg  Pulse 84  Temp(Src) 97.7 F (36.5 C) (Oral)  Resp 20  SpO2 99%  Physical Exam  Constitutional: He is oriented to person, place, and time. He appears well-developed and well-nourished. No distress.  HENT:  Head: Normocephalic and atraumatic.  Eyes: Conjunctivae and EOM are normal.  Neck: Neck supple. No tracheal deviation present.  No nuchal rigidity  Cardiovascular: Normal rate.   Pulmonary/Chest: Effort normal. No respiratory distress.  Musculoskeletal: Normal range  of motion.  Neurological: He is alert and oriented to person, place, and time. He has normal strength. No cranial nerve deficit. He displays a negative Romberg sign. GCS eye subscore is 4. GCS verbal subscore is 5. GCS motor subscore is 6.  Skin: Skin is warm and dry.  Psychiatric: He has a normal mood and affect. His speech is normal and behavior is normal. Judgment and thought  content normal. Thought content is not paranoid. Cognition and memory are normal. He expresses no homicidal and no suicidal ideation.  Nursing note and vitals reviewed.   ED Course  Procedures (including critical care time)  DIAGNOSTIC STUDIES: Oxygen Saturation is 99% on room air, normal by my interpretation.    COORDINATION OF CARE: At 1244 Discussed treatment plan with patient which includes labs and psychiatric evaluation. Patient agrees.   1:34 PM TTS has evaluated pt and has arranged pt to be sent to [facility] for   Labs Review Labs Reviewed - No data to display  Imaging Review No results found.   EKG Interpretation None      MDM   Final diagnoses:  Polysubstance abuse    BP 110/74 mmHg  Pulse 84  Temp(Src) 97.7 F (36.5 C) (Oral)  Resp 20  SpO2 99%  I personally performed the services described in this documentation, which was scribed in my presence. The recorded information has been reviewed and is accurate.     Domenic Moras, PA-C 09/06/14 1003  Ephraim Hamburger, MD 09/06/14 [phone]

## 2014-09-09 ENCOUNTER — Other Ambulatory Visit: Payer: Self-pay

## 2014-09-09 ENCOUNTER — Telehealth: Payer: Self-pay | Admitting: *Deleted

## 2014-09-09 NOTE — Progress Notes (Signed)
Patient Discharge Instructions:  No documentation was faxed to [facility] for [place].  Per the SW the information was already sent.  Isaac [name], 09/09/2014, 3:06 PM

## 2014-09-09 NOTE — Telephone Encounter (Signed)
Pt currently at [facility] residential facility from [facility] ED.  Faxing [facility] for information.

## 2014-09-23 ENCOUNTER — Ambulatory Visit: Payer: Self-pay | Admitting: Infectious Diseases

## 2014-09-24 ENCOUNTER — Ambulatory Visit (HOSPITAL_COMMUNITY): Admission: RE | Admit: 2014-09-24 | Payer: Self-pay | Source: Ambulatory Visit

## 2014-09-25 ENCOUNTER — Ambulatory Visit: Payer: Self-pay | Admitting: Infectious Diseases

## 2014-10-03 ENCOUNTER — Other Ambulatory Visit: Payer: Self-pay | Admitting: Infectious Diseases

## 2014-10-14 ENCOUNTER — Telehealth: Payer: Self-pay | Admitting: *Deleted

## 2014-10-14 ENCOUNTER — Other Ambulatory Visit: Payer: Self-pay | Admitting: *Deleted

## 2014-10-14 DIAGNOSIS — N528 Other male erectile dysfunction: Secondary | ICD-10-CM

## 2014-10-14 MED ORDER — SILDENAFIL CITRATE 100 MG PO TABS: 100.0000 mg | ORAL_TABLET | Freq: Every day | ORAL | Status: DC | PRN

## 2014-10-14 NOTE — Telephone Encounter (Signed)
Faxed copy of application for Viagra to [facility] today

## 2014-10-15 ENCOUNTER — Other Ambulatory Visit: Payer: Self-pay | Admitting: *Deleted

## 2014-10-15 DIAGNOSIS — B20 Human immunodeficiency virus [HIV] disease: Secondary | ICD-10-CM

## 2014-10-15 MED ORDER — EFAVIRENZ-EMTRICITAB-TENOFOVIR 600-200-300 MG PO TABS: 1.0000 | ORAL_TABLET | Freq: Every day | ORAL | Status: DC

## 2014-10-17 ENCOUNTER — Other Ambulatory Visit (INDEPENDENT_AMBULATORY_CARE_PROVIDER_SITE_OTHER): Payer: Self-pay

## 2014-10-17 DIAGNOSIS — Z113 Encounter for screening for infections with a predominantly sexual mode of transmission: Secondary | ICD-10-CM

## 2014-10-17 DIAGNOSIS — B182 Chronic viral hepatitis C: Secondary | ICD-10-CM

## 2014-10-17 DIAGNOSIS — B20 Human immunodeficiency virus [HIV] disease: Secondary | ICD-10-CM

## 2014-10-17 DIAGNOSIS — Z79899 Other long term (current) drug therapy: Secondary | ICD-10-CM

## 2014-10-18 LAB — CBC
HCT: 41.6 % (ref 39.0–52.0)
HEMOGLOBIN: 13.8 g/dL (ref 13.0–17.0)
MCH: 28.1 pg (ref 26.0–34.0)
MCHC: 33.2 g/dL (ref 30.0–36.0)
MCV: 84.7 fL (ref 78.0–100.0)
MPV: 7.9 fL — ABNORMAL LOW (ref 8.6–12.4)
Platelets: 288 10*3/uL (ref 150–400)
RBC: 4.91 MIL/uL (ref 4.22–5.81)
RDW: 12.7 % (ref 11.5–15.5)
WBC: 4.7 10*3/uL (ref 4.0–10.5)

## 2014-10-18 LAB — COMPREHENSIVE METABOLIC PANEL
ALT: 26 U/L (ref 0–53)
AST: 29 U/L (ref 0–37)
Albumin: 4.1 g/dL (ref 3.5–5.2)
Alkaline Phosphatase: 60 U/L (ref 39–117)
BUN: 10 mg/dL (ref 6–23)
CHLORIDE: 103 meq/L (ref 96–112)
CO2: 27 mEq/L (ref 19–32)
Calcium: 9.3 mg/dL (ref 8.4–10.5)
Creat: 0.97 mg/dL (ref 0.50–1.35)
Glucose, Bld: 71 mg/dL (ref 70–99)
Potassium: 4.1 mEq/L (ref 3.5–5.3)
Sodium: 140 mEq/L (ref 135–145)
Total Bilirubin: 0.4 mg/dL (ref 0.2–1.2)
Total Protein: 7.3 g/dL (ref 6.0–8.3)

## 2014-10-18 LAB — PROTIME-INR
INR: 1.08 (ref <1.50)
PROTHROMBIN TIME: 14 s (ref 11.6–15.2)

## 2014-10-18 LAB — RPR

## 2014-10-18 LAB — T-HELPER CELL (CD4) - (RCID CLINIC ONLY)
CD4 % Helper T Cell: 25 % — ABNORMAL LOW (ref 33–55)
CD4 T CELL ABS: 570 /uL (ref 400–2700)

## 2014-10-18 LAB — LIPID PANEL
CHOL/HDL RATIO: 5.3 ratio
Cholesterol: 196 mg/dL (ref 0–200)
HDL: 37 mg/dL — AB (ref >39)
LDL CALC: 129 mg/dL — AB (ref 0–99)
Triglycerides: 148 mg/dL (ref <150)
VLDL: 30 mg/dL (ref 0–40)

## 2014-10-18 LAB — IRON: Iron: 79 ug/dL (ref 42–165)

## 2014-10-18 LAB — ANA: ANA: NEGATIVE

## 2014-10-19 LAB — HIV-1 RNA QUANT-NO REFLEX-BLD
HIV 1 RNA Quant: <20 copies/mL (ref <20)
HIV-1 RNA Quant, Log: <1.3 {Log} (ref <1.30)

## 2014-10-30 ENCOUNTER — Ambulatory Visit: Payer: Self-pay | Admitting: Infectious Diseases

## 2014-11-02 ENCOUNTER — Other Ambulatory Visit: Payer: Self-pay | Admitting: Infectious Disease

## 2014-11-02 DIAGNOSIS — B20 Human immunodeficiency virus [HIV] disease: Secondary | ICD-10-CM

## 2014-11-04 ENCOUNTER — Telehealth: Payer: Self-pay | Admitting: *Deleted

## 2014-11-04 NOTE — Telephone Encounter (Signed)
Called the patient to advise him that he could pick up his PA medication.

## 2014-11-04 NOTE — Telephone Encounter (Signed)
Received a fax from [facility].  Isaac [name] has been approved for his viagra through 10-30-15.  I called but was unable to leave a message.  His mailbox is full.

## 2014-11-05 NOTE — Telephone Encounter (Signed)
Patient returned your call, RN relayed the information.  Patient picked it up from [facility] yesterday, 11/04/14.  He is appreciative of your efforts!

## 2014-11-13 ENCOUNTER — Ambulatory Visit (HOSPITAL_COMMUNITY)
Admission: RE | Admit: 2014-11-13 | Discharge: 2014-11-13 | Disposition: A | Discharge status: Home / Self Care | Payer: Self-pay | Source: Ambulatory Visit | Attending: Infectious Diseases | Admitting: Infectious Diseases

## 2014-11-13 DIAGNOSIS — B1920 Unspecified viral hepatitis C without hepatic coma: Secondary | ICD-10-CM | POA: Insufficient documentation

## 2014-11-13 DIAGNOSIS — B182 Chronic viral hepatitis C: Secondary | ICD-10-CM

## 2014-11-13 DIAGNOSIS — Z21 Asymptomatic human immunodeficiency virus [HIV] infection status: Secondary | ICD-10-CM | POA: Insufficient documentation

## 2014-11-18 ENCOUNTER — Encounter: Payer: Self-pay | Admitting: Infectious Diseases

## 2014-11-18 ENCOUNTER — Ambulatory Visit (INDEPENDENT_AMBULATORY_CARE_PROVIDER_SITE_OTHER): Payer: Self-pay | Admitting: Infectious Diseases

## 2014-11-18 VITALS — BP 136/88 | HR 82 | Temp 98.2°F | Ht 69.0 in | Wt 162.0 lb

## 2014-11-18 DIAGNOSIS — B20 Human immunodeficiency virus [HIV] disease: Secondary | ICD-10-CM

## 2014-11-18 DIAGNOSIS — B1710 Acute hepatitis C without hepatic coma: Secondary | ICD-10-CM

## 2014-11-18 DIAGNOSIS — Z9289 Personal history of other medical treatment: Secondary | ICD-10-CM

## 2014-11-18 DIAGNOSIS — F1414 Cocaine abuse with cocaine-induced mood disorder: Secondary | ICD-10-CM

## 2014-11-18 DIAGNOSIS — Z09 Encounter for follow-up examination after completed treatment for conditions other than malignant neoplasm: Secondary | ICD-10-CM

## 2014-11-18 NOTE — Assessment & Plan Note (Signed)
Needs to be [facility] to [facility] his staging. Get rx.  Will refer, make sure he has orange card.

## 2014-11-18 NOTE — Assessment & Plan Note (Signed)
Doing well, going to NA/AA. Offered him Leveda Anna but he defers.

## 2014-11-18 NOTE — Assessment & Plan Note (Signed)
He is doing very well since rehab.  Offered/refused condoms.  Will see him back in 4 months with labs prior.

## 2014-11-18 NOTE — Assessment & Plan Note (Signed)
Going to na/aa.

## 2014-11-18 NOTE — Progress Notes (Signed)
   Subjective:    Patient ID: NETTIE CROMWELL, male    DOB: 12/29/1960, 54 y.o.   MRN: [id number]  HPI 54 yo M with hx of HIV+, Hep C (1a F0/F1 [11-2014]), seizure d/o, 10-2014 hospitalized for substance abuse at [facility] (ETOH, drug addiction).   Has been on atripla, no problems.  Has been clean since d/c. Is going to na/aa.  Hasn't been eating as consistently as prev. Feels like he is 10# down.  Has been smoking cigarettes.  No further sz.   HIV 1 RNA QUANT (copies/mL)  Date Value  10/17/2014 <20  08/29/2014 <20  05/10/2014 <20   CD4 T CELL ABS (/uL)  Date Value  10/17/2014 570  08/29/2014 740  05/10/2014 390*   Review of Systems  Constitutional: Negative for appetite change and unexpected weight change.  Gastrointestinal: Negative for diarrhea and constipation.  Genitourinary: Negative for difficulty urinating.       Objective:   Physical Exam  Constitutional: He appears well-developed and well-nourished.  HENT:  Mouth/Throat: No oropharyngeal exudate.  Eyes: EOM are normal. Pupils are equal, round, and reactive to light.  Neck: Neck supple.  Cardiovascular: Normal rate, regular rhythm and normal heart sounds.   Pulmonary/Chest: Effort normal and breath sounds normal.  Abdominal: Soft. Bowel sounds are normal. He exhibits no distension. There is no tenderness.  Lymphadenopathy:    He has no cervical adenopathy.          Assessment & Plan:

## 2014-11-23 ENCOUNTER — Encounter (HOSPITAL_COMMUNITY): Payer: Self-pay | Admitting: Emergency Medicine

## 2014-11-23 ENCOUNTER — Emergency Department (HOSPITAL_COMMUNITY)
Admission: EM | Admit: 2014-11-23 | Discharge: 2014-11-23 | Disposition: A | Discharge status: Home / Self Care | Payer: Self-pay | Attending: Emergency Medicine | Admitting: Emergency Medicine

## 2014-11-23 DIAGNOSIS — F1010 Alcohol abuse, uncomplicated: Secondary | ICD-10-CM | POA: Diagnosis present

## 2014-11-23 DIAGNOSIS — F1020 Alcohol dependence, uncomplicated: Secondary | ICD-10-CM | POA: Insufficient documentation

## 2014-11-23 DIAGNOSIS — Z720 Tobacco use: Secondary | ICD-10-CM | POA: Insufficient documentation

## 2014-11-23 DIAGNOSIS — G40909 Epilepsy, unspecified, not intractable, without status epilepticus: Secondary | ICD-10-CM | POA: Insufficient documentation

## 2014-11-23 DIAGNOSIS — Z79899 Other long term (current) drug therapy: Secondary | ICD-10-CM | POA: Insufficient documentation

## 2014-11-23 DIAGNOSIS — Z21 Asymptomatic human immunodeficiency virus [HIV] infection status: Secondary | ICD-10-CM | POA: Insufficient documentation

## 2014-11-23 DIAGNOSIS — F1410 Cocaine abuse, uncomplicated: Secondary | ICD-10-CM | POA: Insufficient documentation

## 2014-11-23 DIAGNOSIS — Z862 Personal history of diseases of the blood and blood-forming organs and certain disorders involving the immune mechanism: Secondary | ICD-10-CM | POA: Insufficient documentation

## 2014-11-23 DIAGNOSIS — Z8619 Personal history of other infectious and parasitic diseases: Secondary | ICD-10-CM | POA: Insufficient documentation

## 2014-11-23 DIAGNOSIS — I10 Essential (primary) hypertension: Secondary | ICD-10-CM | POA: Insufficient documentation

## 2014-11-23 LAB — COMPREHENSIVE METABOLIC PANEL
ALT: 141 U/L — ABNORMAL HIGH (ref 0–53)
ANION GAP: 12 (ref 5–15)
AST: 159 U/L — ABNORMAL HIGH (ref 0–37)
Albumin: 4.2 g/dL (ref 3.5–5.2)
Alkaline Phosphatase: 83 U/L (ref 39–117)
BUN: 20 mg/dL (ref 6–23)
CO2: 22 mmol/L (ref 19–32)
Calcium: 8.6 mg/dL (ref 8.4–10.5)
Chloride: 108 mmol/L (ref 96–112)
Creatinine, Ser: 1.09 mg/dL (ref 0.50–1.35)
GFR calc Af Amer: 88 mL/min — ABNORMAL LOW (ref >90)
GFR, EST NON AFRICAN AMERICAN: 76 mL/min — AB (ref >90)
Glucose, Bld: 105 mg/dL — ABNORMAL HIGH (ref 70–99)
Potassium: 4.2 mmol/L (ref 3.5–5.1)
Sodium: 142 mmol/L (ref 135–145)
Total Bilirubin: 1 mg/dL (ref 0.3–1.2)
Total Protein: 7.9 g/dL (ref 6.0–8.3)

## 2014-11-23 LAB — CBC WITH DIFFERENTIAL/PLATELET
Basophils Absolute: 0 10*3/uL (ref 0.0–0.1)
Basophils Relative: 1 % (ref 0–1)
Eosinophils Absolute: 0.1 10*3/uL (ref 0.0–0.7)
Eosinophils Relative: 2 % (ref 0–5)
HEMATOCRIT: 39.8 % (ref 39.0–52.0)
Hemoglobin: 13.6 g/dL (ref 13.0–17.0)
LYMPHS PCT: 57 % — AB (ref 12–46)
Lymphs Abs: 3.4 10*3/uL (ref 0.7–4.0)
MCH: 29.4 pg (ref 26.0–34.0)
MCHC: 34.2 g/dL (ref 30.0–36.0)
MCV: 86 fL (ref 78.0–100.0)
Monocytes Absolute: 0.3 10*3/uL (ref 0.1–1.0)
Monocytes Relative: 5 % (ref 3–12)
NEUTROS ABS: 2.1 10*3/uL (ref 1.7–7.7)
Neutrophils Relative %: 35 % — ABNORMAL LOW (ref 43–77)
PLATELETS: 296 10*3/uL (ref 150–400)
RBC: 4.63 MIL/uL (ref 4.22–5.81)
RDW: 12.7 % (ref 11.5–15.5)
WBC: 5.9 10*3/uL (ref 4.0–10.5)

## 2014-11-23 LAB — RAPID URINE DRUG SCREEN, HOSP PERFORMED
AMPHETAMINES: NOT DETECTED
BENZODIAZEPINES: NOT DETECTED
Barbiturates: NOT DETECTED
COCAINE: POSITIVE — AB
Opiates: NOT DETECTED
Tetrahydrocannabinol: NOT DETECTED

## 2014-11-23 LAB — ETHANOL: Alcohol, Ethyl (B): 143 mg/dL — ABNORMAL HIGH (ref 0–9)

## 2014-11-23 MED ORDER — LORAZEPAM 1 MG PO TABS: 0.0000 mg | ORAL_TABLET | Freq: Two times a day (BID) | ORAL | Status: DC

## 2014-11-23 MED ORDER — LORAZEPAM 1 MG PO TABS
0.0000 mg | ORAL_TABLET | Freq: Four times a day (QID) | ORAL | Status: DC
  Administered 2014-11-23: 1 mg via ORAL
  Filled 2014-11-23: qty 1

## 2014-11-23 MED ORDER — IBUPROFEN 200 MG PO TABS: 600.0000 mg | ORAL_TABLET | Freq: Three times a day (TID) | ORAL | Status: DC | PRN

## 2014-11-23 MED ORDER — ZOLPIDEM TARTRATE 5 MG PO TABS: 5.0000 mg | ORAL_TABLET | Freq: Every evening | ORAL | Status: DC | PRN

## 2014-11-23 MED ORDER — NICOTINE 21 MG/24HR TD PT24: 21.0000 mg | MEDICATED_PATCH | Freq: Every day | TRANSDERMAL | Status: DC

## 2014-11-23 MED ORDER — ACETAMINOPHEN 325 MG PO TABS: 650.0000 mg | ORAL_TABLET | ORAL | Status: DC | PRN

## 2014-11-23 MED ORDER — ONDANSETRON HCL 4 MG PO TABS: 4.0000 mg | ORAL_TABLET | Freq: Three times a day (TID) | ORAL | Status: DC | PRN

## 2014-11-23 MED ORDER — THIAMINE HCL 100 MG/ML IJ SOLN: 100.0000 mg | Freq: Every day | INTRAMUSCULAR | Status: DC

## 2014-11-23 MED ORDER — ALUM & MAG HYDROXIDE-SIMETH 200-200-20 MG/5ML PO SUSP: 30.0000 mL | ORAL | Status: DC | PRN

## 2014-11-23 MED ORDER — VITAMIN B-1 100 MG PO TABS
100.0000 mg | ORAL_TABLET | Freq: Every day | ORAL | Status: DC
  Administered 2014-11-23: 100 mg via ORAL
  Filled 2014-11-23: qty 1

## 2014-11-23 NOTE — ED Provider Notes (Signed)
CSN: [id number]     Arrival date & time 11/23/14  0019 History   First MD Initiated Contact with Patient 11/23/14 0600     Chief Complaint  Patient presents with  . Alcohol Intoxication     (Consider location/radiation/quality/duration/timing/severity/associated sxs/prior Treatment) HPI Isaac [name] is a 54 year old male with past medical history of hypertension, HIV, seizures, hepatitis C who presents the ER requesting assistance with detoxification from alcohol and crack/cocaine. Patient states he has had an ongoing issue with polysubstance abuse, and is tired of not being able to function because of his intoxication and substance abuse disorder. Patient states he last used alcohol and cocaine at midnight last night, approximately 6 hours ago. Patient states he does have a history of withdrawal seizures, and last had a withdrawal seizure in December while trying to detoxify from alcohol. Patient denies having any specific medical complaints. Patient denies headache, blurred vision, weakness, dizziness, chest pain, shortness of breath, abdominal pain, nausea, vomiting.  Past Medical History  Diagnosis Date  . Hypertension   . HIV (human immunodeficiency virus infection)   . Seizures   . Immune deficiency disorder   . Hepatitis C    History reviewed. No pertinent past surgical history. History reviewed. No pertinent family history. History  Substance Use Topics  . Smoking status: Current Every Day Smoker -- 1.00 packs/day for 35 years    Types: Cigarettes    Start date: 10/28/2011  . Smokeless tobacco: Never Used     Comment: not ready yet  . Alcohol Use: 0.0 oz/week    0 Standard drinks or equivalent per week     Comment: occasional    Review of Systems  Constitutional: Negative for fever.  HENT: Negative for trouble swallowing.   Eyes: Negative for visual disturbance.  Respiratory: Negative for shortness of breath.   Cardiovascular: Negative for chest pain.   Gastrointestinal: Negative for nausea, vomiting and abdominal pain.  Genitourinary: Negative for dysuria.  Musculoskeletal: Negative for neck pain.  Skin: Negative for rash.  Neurological: Negative for dizziness, weakness and numbness.  Psychiatric/Behavioral: Negative.       Allergies  Review of patient's allergies indicates no known allergies.  Home Medications   Prior to Admission medications   Medication Sig Start Date End Date Taking? Authorizing Provider  ATRIPLA [phone] MG per tablet TAKE 1 TABLET BY MOUTH EVERY DAY 11/04/14  Yes Campbell Riches, MD  levETIRAcetam (KEPPRA) 500 MG tablet Take 1 tablet (500 mg total) by mouth 2 (two) times daily. For seizures 09/03/14  Yes Encarnacion Slates, NP  lisinopril-hydrochlorothiazide (PRINZIDE,ZESTORETIC) 10-12.5 MG per tablet Take 1 tablet by mouth daily. For high blood pressure 09/03/14  Yes Encarnacion Slates, NP  sildenafil (VIAGRA) 100 MG tablet Take 1 tablet (100 mg total) by mouth daily as needed for erectile dysfunction. 10/14/14   Campbell Riches, MD   BP 133/73 mmHg  Pulse 76  Temp(Src) 98.7 F (37.1 C) (Oral)  Resp 18  SpO2 100% Physical Exam  Constitutional: He is oriented to person, place, and time. He appears well-developed and well-nourished. No distress.  HENT:  Head: Normocephalic and atraumatic.  Mouth/Throat: Oropharynx is clear and moist. No oropharyngeal exudate.  Eyes: Right eye exhibits no discharge. Left eye exhibits no discharge. No scleral icterus.  Neck: Normal range of motion.  Cardiovascular: Normal rate, regular rhythm and normal heart sounds.   No murmur heard. Pulmonary/Chest: Effort normal and breath sounds normal. No respiratory distress.  Abdominal: Soft. There is no tenderness.  Musculoskeletal: Normal range of motion. He exhibits no edema or tenderness.  Neurological: He is alert and oriented to person, place, and time. No cranial nerve deficit. Coordination normal.  Skin: Skin is warm and dry.  No rash noted. He is not diaphoretic.  Psychiatric:  Patient's mood is depressed with affect appropriate to mood. Speech is normal and communicative. Behavior is appropriate and goal directed. Patient does not appear to be responding to any internal stimuli. Thought content revolves around patient's substance abuse and his feeling that this is causing his life to be dysfunctional. Judgment and insight are appropriate.  Nursing note and vitals reviewed.   ED Course  Procedures (including critical care time) Labs Review Labs Reviewed  COMPREHENSIVE METABOLIC PANEL - Abnormal; Notable for the following:    Glucose, Bld 105 (*)    AST 159 (*)    ALT 141 (*)    GFR calc non Af Amer 76 (*)    GFR calc Af Amer 88 (*)    All other components within normal limits  ETHANOL - Abnormal; Notable for the following:    Alcohol, Ethyl (B) 143 (*)    All other components within normal limits  CBC WITH DIFFERENTIAL/PLATELET - Abnormal; Notable for the following:    Neutrophils Relative % 35 (*)    Lymphocytes Relative 57 (*)    All other components within normal limits  URINE RAPID DRUG SCREEN (HOSP PERFORMED) - Abnormal; Notable for the following:    Cocaine POSITIVE (*)    All other components within normal limits    Imaging Review No results found.   EKG Interpretation   Date/Time:  Saturday November 23 2014 08:22:04 EDT Ventricular Rate:  76 PR Interval:  119 QRS Duration: 82 QT Interval:  373 QTC Calculation: 419 R Axis:   72 Text Interpretation:  Sinus rhythm Borderline short PR interval Borderline  T abnormalities, inferior leads since last tracing no significant change  Confirmed by Eulis Foster  MD, ELLIOTT ([id number]) on 11/23/2014 8:52:58 AM      MDM   Final diagnoses:  Uncomplicated alcohol dependence    Patient requesting alcohol and cocaine detox. Patient last used approximate 6 hours ago, still has alcohol level of 143. Clinically patient is sober. We'll consult TTS based on the  fact the patient does have a history of withdrawal seizures, as well as a seizure history for she takes Keppra. Patient states he has been compliant with all his medications. Patient denying any medical complaints to me. Patient not showing any signs or symptoms of withdrawal at this time during evaluation.  Consult TTS, psychiatry evaluated patient and report that patient does not meet inpatient criteria at this time. Psychiatry recommends to discharge patient and to give outpatient resources. Patient hemodynamically stable and medically cleared at this time, we'll discharge patient and have him follow up with outpatient resources for alcohol detoxification. Return precautions discussed. Patient verbalizes understanding and agreement with this plan. Patient encouraged to call or return to ER with any worsening symptoms or should he have any questions or concerns.  BP 133/73 mmHg  Pulse 76  Temp(Src) 98.7 F (37.1 C) (Oral)  Resp 18  SpO2 100%  Signed,  Dahlia Bailiff, PA-C 2:00 PM   Dahlia Bailiff, PA-C 60/60/04 [id number]  Delora Fuel, MD 74/14/23 9532

## 2014-11-23 NOTE — ED Notes (Signed)
Pt given bus pass and lunch tray. Will d/c to lobby once he finishes eating.

## 2014-11-23 NOTE — ED Notes (Signed)
Pt was asked for a urine sample, pt sts that he had just went but sts he will try again.

## 2014-11-23 NOTE — ED Notes (Signed)
Pt states he wants alcohol and crack cocaine detox. Denies SI/HI. Pt states he just used 20 minutes ago.

## 2014-11-23 NOTE — Discharge Instructions (Signed)
Alcohol Use Disorder °Alcohol use disorder is a mental disorder. It is not a one-time incident of heavy drinking. Alcohol use disorder is the excessive and uncontrollable use of alcohol over time that leads to problems with functioning in one or more areas of daily living. People with this disorder risk harming themselves and others when they drink to excess. Alcohol use disorder also can cause other mental disorders, such as mood and anxiety disorders, and serious physical problems. People with alcohol use disorder often misuse other drugs.  °Alcohol use disorder is common and widespread. Some people with this disorder drink alcohol to cope with or escape from negative life events. Others drink to relieve chronic pain or symptoms of mental illness. People with a family history of alcohol use disorder are at higher risk of losing control and using alcohol to excess.  °SYMPTOMS  °Signs and symptoms of alcohol use disorder may include the following:  °· Consumption of alcohol in larger amounts or over a longer period of time than intended. °· Multiple unsuccessful attempts to cut down or control alcohol use.   °· A great deal of time spent obtaining alcohol, using alcohol, or recovering from the effects of alcohol (hangover). °· A strong desire or urge to use alcohol (cravings).   °· Continued use of alcohol despite problems at work, school, or home because of alcohol use.   °· Continued use of alcohol despite problems in relationships because of alcohol use. °· Continued use of alcohol in situations when it is physically hazardous, such as driving a car. °· Continued use of alcohol despite awareness of a physical or psychological problem that is likely related to alcohol use. Physical problems related to alcohol use can involve the brain, heart, liver, stomach, and intestines. Psychological problems related to alcohol use include intoxication, depression, anxiety, psychosis, delirium, and dementia.   °· The need for  increased amounts of alcohol to achieve the same desired effect, or a decreased effect from the consumption of the same amount of alcohol (tolerance). °· Withdrawal symptoms upon reducing or stopping alcohol use, or alcohol use to reduce or avoid withdrawal symptoms. Withdrawal symptoms include: °· Racing heart. °· Hand tremor. °· Difficulty sleeping. °· Nausea. °· Vomiting. °· Hallucinations. °· Restlessness. °· Seizures. °DIAGNOSIS °Alcohol use disorder is diagnosed through an assessment by your health care provider. Your health care provider may start by asking three or four questions to screen for excessive or problematic alcohol use. To confirm a diagnosis of alcohol use disorder, at least two symptoms must be present within a 12-month period. The severity of alcohol use disorder depends on the number of symptoms: °· Mild--two or three. °· Moderate--four or five. °· Severe--six or more. °Your health care provider may perform a physical exam or use results from lab tests to see if you have physical problems resulting from alcohol use. Your health care provider may refer you to a mental health professional for evaluation. °TREATMENT  °Some people with alcohol use disorder are able to reduce their alcohol use to low-risk levels. Some people with alcohol use disorder need to quit drinking alcohol. When necessary, mental health professionals with specialized training in substance use treatment can help. Your health care provider can help you decide how severe your alcohol use disorder is and what type of treatment you need. The following forms of treatment are available:  °· Detoxification. Detoxification involves the use of prescription medicines to prevent alcohol withdrawal symptoms in the first week after quitting. This is important for people with a history of symptoms   of withdrawal and for heavy drinkers who are likely to have withdrawal symptoms. Alcohol withdrawal can be dangerous and, in severe cases, cause  death. Detoxification is usually provided in a hospital or in-patient substance use treatment facility.  Counseling or talk therapy. Talk therapy is provided by substance use treatment counselors. It addresses the reasons people use alcohol and ways to keep them from drinking again. The goals of talk therapy are to help people with alcohol use disorder find healthy activities and ways to cope with life stress, to identify and avoid triggers for alcohol use, and to handle cravings, which can cause relapse.  Medicines.Different medicines can help treat alcohol use disorder through the following actions:  Decrease alcohol cravings.  Decrease the positive reward response felt from alcohol use.  Produce an uncomfortable physical reaction when alcohol is used (aversion therapy).  Support groups. Support groups are run by people who have quit drinking. They provide emotional support, advice, and guidance. These forms of treatment are often combined. Some people with alcohol use disorder benefit from intensive combination treatment provided by specialized substance use treatment centers. Both inpatient and outpatient treatment programs are available. Document Released: 09/30/2004 Document Revised: 01/07/2014 Document Reviewed: 11/30/2012 [facility] Patient Information 2015 [place], [place]. This information is not intended to replace advice given to you by your health care provider. Make sure you discuss any questions you have with your health care provider.   Alcohol Withdrawal Anytime drug use is interfering with normal living activities it has become abuse. This includes problems with family and friends. Psychological dependence has developed when your mind tells you that the drug is needed. This is usually followed by physical dependence when a continuing increase of drugs are required to get the same feeling or "high." This is known as addiction or chemical dependency. A person's risk is much higher if  there is a history of chemical dependency in the family. Mild Withdrawal Following Stopping Alcohol, When Addiction or Chemical Dependency Has Developed When a person has developed tolerance to alcohol, any sudden stopping of alcohol can cause uncomfortable physical symptoms. Most of the time these are mild and consist of tremors in the hands and increases in heart rate, breathing, and temperature. Sometimes these symptoms are associated with anxiety, panic attacks, and bad dreams. There may also be stomach upset. Normal sleep patterns are often interrupted with periods of inability to sleep (insomnia). This may last for 6 months. Because of this discomfort, many people choose to continue drinking to get rid of this discomfort and to try to feel normal. Severe Withdrawal with Decreased or No Alcohol Intake, When Addiction or Chemical Dependency Has Developed About five percent of alcoholics will develop signs of severe withdrawal when they stop using alcohol. One sign of this is development of generalized seizures (convulsions). Other signs of this are severe agitation and confusion. This may be associated with believing in things which are not real or seeing things which are not really there (delusions and hallucinations). Vitamin deficiencies are usually present if alcohol intake has been long-term. Treatment for this most often requires hospitalization and close observation. Addiction can only be helped by stopping use of all chemicals. This is hard but may save your life. With continual alcohol use, possible outcomes are usually loss of self respect and esteem, violence, and death. Addiction cannot be cured but it can be stopped. This often requires outside help and the care of professionals. Treatment centers are listed in the yellow pages under Cocaine, Narcotics, and  Alcoholics Anonymous. Most hospitals and clinics can refer you to a specialized care center. It is not necessary for you to go through  the uncomfortable symptoms of withdrawal. Your caregiver can provide you with medicines that will help you through this difficult period. Try to avoid situations, friends, or drugs that made it possible for you to keep using alcohol in the past. Learn how to say no. It takes a long period of time to overcome addictions to all drugs, including alcohol. There may be many times when you feel as though you want a drink. After getting rid of the physical addiction and withdrawal, you will have a lessening of the craving which tells you that you need alcohol to feel normal. Call your caregiver if more support is needed. Learn who to talk to in your family and among your friends so that during these periods you can receive outside help. Alcoholics Anonymous (AA) has helped many people over the years. To get further help, contact AA or call your caregiver, counselor, or clergyperson. Al-Anon and Alateen are support groups for friends and family members of an alcoholic. The people who love and care for an alcoholic often need help, too. For information about these organizations, check your phone directory or call a local alcoholism treatment center.  SEEK IMMEDIATE MEDICAL CARE IF:   You have a seizure.  You have a fever.  You experience uncontrolled vomiting or you vomit up blood. This may be bright red or look like black coffee grounds.  You have blood in the stool. This may be bright red or appear as a black, tarry, bad-smelling stool.  You become lightheaded or faint. Do not drive if you feel this way. Have someone else drive you or call 102 for help.  You become more agitated or confused.  You develop uncontrolled anxiety.  You begin to see things that are not really there (hallucinate). Your caregiver has determined that you completely understand your medical condition, and that your mental state is back to normal. You understand that you have been treated for alcohol withdrawal, have agreed not to  drink any alcohol for a minimum of 1 day, will not operate a car or other machinery for 24 hours, and have had an opportunity to ask any questions about your condition. Document Released: 06/02/2005 Document Revised: 11/15/2011 Document Reviewed: 04/10/2008 [facility] Patient Information 2015 [place], [place]. This information is not intended to replace advice given to you by your health care provider. Make sure you discuss any questions you have with your health care provider.  Stimulant Use Disorder-Cocaine Cocaine is one of a group of powerful drugs called stimulants. Cocaine has medical uses for stopping nosebleeds and for pain control before minor nose or dental surgery. However, cocaine is misused because of the effects that it produces. These effects include:   A feeling of extreme pleasure.  Alertness.  High energy. Common street names for cocaine include coke, crack, blow, snow, and nose candy. Cocaine is snorted, dissolved in water and injected, or smoked.  Stimulants are addictive because they activate regions of the brain that produce both the pleasurable sensation of "reward" and psychological dependence. Together, these actions account for loss of control and the rapid development of drug dependence. This means you become ill without the drug (withdrawal) and need to keep using it to function.  Stimulant use disorder is use of stimulants that disrupts your daily life. It disrupts relationships with family and friends and how you do your job. Cocaine  increases your blood pressure and heart rate. It can cause a heart attack or stroke. Cocaine can also cause death from irregular heart rate or seizures. SYMPTOMS Symptoms of stimulant use disorder with cocaine include:  Use of cocaine in larger amounts or over a longer period of time than intended.  Unsuccessful attempts to cut down or control cocaine use.  A lot of time spent obtaining, using, or recovering from the effects of  cocaine.  A strong desire or urge to use cocaine (craving).  Continued use of cocaine in spite of major problems at work, school, or home because of use.  Continued use of cocaine in spite of relationship problems because of use.  Giving up or cutting down on important life activities because of cocaine use.  Use of cocaine over and over in situations when it is physically hazardous, such as driving a car.  Continued use of cocaine in spite of a physical problem that is likely related to use. Physical problems can include:  Malnutrition.  Nosebleeds.  Chest pain.  High blood pressure.  A hole that develops between the part of your nose that separates your nostrils (perforated nasal septum).  Lung and kidney damage.  Continued use of cocaine in spite of a mental problem that is likely related to use. Mental problems can include:  Schizophrenia-like symptoms.  Depression.  Bipolar mood swings.  Anxiety.  Sleep problems.  Need to use more and more cocaine to get the same effect, or lessened effect over time with use of the same amount of cocaine (tolerance).  Having withdrawal symptoms when cocaine use is stopped, or using cocaine to reduce or avoid withdrawal symptoms. Withdrawal symptoms include:  Depressed or irritable mood.  Low energy or restlessness.  Bad dreams.  Poor or excessive sleep.  Increased appetite. DIAGNOSIS Stimulant use disorder is diagnosed by your health care provider. You may be asked questions about your cocaine use and how it affects your life. A physical exam may be done. A drug screen may be ordered. You may be referred to a mental health professional. The diagnosis of stimulant use disorder requires at least two symptoms within 12 months. The type of stimulant use disorder depends on the number of signs and symptoms you have. The type may be:  Mild. Two or three signs and symptoms.  Moderate. Four or five signs and symptoms.  Severe.  Six or more signs and symptoms. TREATMENT Treatment for stimulant use disorder is usually provided by mental health professionals with training in substance use disorders. The following options are available:  Counseling or talk therapy. Talk therapy addresses the reasons you use cocaine and ways to keep you from using again. Goals of talk therapy include:  Identifying and avoiding triggers for use.  Handling cravings.  Replacing use with healthy activities.  Support groups. Support groups provide emotional support, advice, and guidance.  Medicine. Certain medicines may decrease cocaine cravings or withdrawal symptoms. HOME CARE INSTRUCTIONS  Take medicines only as directed by your health care provider.  Identify the people and activities that trigger your cocaine use and avoid them.  Keep all follow-up visits as directed by your health care provider. SEEK MEDICAL CARE IF:  Your symptoms get worse or you relapse.  You are not able to take medicines as directed. SEEK IMMEDIATE MEDICAL CARE IF:  You have serious thoughts about hurting yourself or others.  You have a seizure, chest pain, sudden weakness, or loss of speech or vision. FOR MORE INFORMATION  [employer] on Drug Abuse: motorcyclefax.[email or web address]  Substance Abuse and Mental Health Services Administration: [email or web address] Document Released: 08/20/2000 Document Revised: 01/07/2014 Document Reviewed: 09/05/2013 [facility] Patient Information 2015 [place], [place]. This information is not intended to replace advice given to you by your health care provider. Make sure you discuss any questions you have with your health care provider.   Emergency Department Resource Guide 1) Find a Doctor and Pay Out of Pocket Although you won't have to find out who is covered by your insurance plan, it is a good idea to ask around and get recommendations. You will then need to call the office and see if the doctor you have chosen will accept  you as a new patient and what types of options they offer for patients who are self-pay. Some doctors offer discounts or will set up payment plans for their patients who do not have insurance, but you will need to ask so you aren't surprised when you get to your appointment.  2) Contact Your Local Health Department Not all health departments have doctors that can see patients for sick visits, but many do, so it is worth a call to see if yours does. If you don't know where your local health department is, you can check in your phone book. The CDC also has a tool to help you locate your state's health department, and many state websites also have listings of all of their local health departments.  3) Find a [facility] If your illness is not likely to be very severe or complicated, you may want to try a walk in clinic. These are popping up all over the country in pharmacies, drugstores, and shopping centers. They're usually staffed by nurse practitioners or physician assistants that have been trained to treat common illnesses and complaints. They're usually fairly quick and inexpensive. However, if you have serious medical issues or chronic medical problems, these are probably not your best option.  No Primary Care Doctor: - Call Health Connect at  [phone] - they can help you locate a primary care doctor that  accepts your insurance, provides certain services, etc. - Physician Referral Service- [phone]  Chronic Pain Problems: Organization         Address  Phone   Notes  [facility]  [phone] Patients need to be referred by their primary care doctor.   Medication Assistance: Organization         Address  Phone   Notes  [facility] Medication [facility] [address] [place], [place] [address] [phone] --Must be a resident of [facility] -- Must have NO insurance coverage whatsoever (no Medicaid/ Medicare, etc.) -- The pt. MUST  have a primary care doctor that directs their care regularly and follows them in the community   MedAssist  [phone]   [employer]  [phone]    Agencies that provide inexpensive medical care: Organization         Address  Phone   Notes  [place]  [phone]   [name]    [phone]   [facility] [place], [place] [address] [phone]   [place] [address] [place] [phone]   Planned Parenthood    [phone]   [facility]    [phone]   [place] and [place] [address] [place], [place] Phone:  [phone])  [phone], Fax:  [phone] Hours of Operation:  9 am - 6 pm, M-F.  Also accepts Medicaid/Medicare and self-pay.  [facility] for [place], [address], [place] Phone: [phone], Fax: [phone]. Hours of Operation:  8:30 am - 5:30 pm, M-F.  Also accepts Medicaid and self-pay.  [facility] [place] [address], [place] Phone: [phone]   [place], [place], [place] [phone], Ext. [phone] Mondays & Thursdays: 7-9 AM.  First 15 patients are seen on a first come, first serve basis.    [place] Providers:  Organization         Address  Phone   Notes  [facility] [address], [place] [phone] Also accepts self-pay patients.  [facility] [address], [place]  [phone]   [place], [address], [place] [phone]   [facility] [address], [place] [phone]   Lucianne Lei [address], [place]   [phone] Only accepts [place] patients after they have their name applied to their card.   Self-Pay (no insurance) in [facility]:  Organization         Address  Phone   Notes  Sickle Cell Patients, [facility] [place] [phone]   [facility] [place] [phone]   [name] [place]  [place], [place], [place] [phone]   [facility]/Dr. Osei-Bonsu  [address], [place] or [address], [place] [phone] Phone number for both [place] and [place] locations is the same.  Urgent Medical and [facility] [address], [place] [phone]   [facility] [address], [place] or [address] [phone]   [facility] [address], [place] [phone], phone; [phone], fax Sees patients 1st and 3rd Saturday of every month.  Must not qualify for public or private insurance (i.e. Medicaid, Medicare, [place] Health Choice, Veterans' Benefits)  Household income should be no more than 200% of the poverty level The clinic cannot treat you if you are pregnant or think you are pregnant  Sexually transmitted diseases are not treated at the clinic.    Dental Care: Organization         Address  Phone  Notes  [facility] Department of [facility] [place] [phone] Accepts children up to age 20 who are enrolled in [place] or [place]; pregnant women with a Medicaid card; and children who have applied for Medicaid or [place] Health Choice, but were declined, whose parents can pay a reduced fee at time of service.  [facility] Department of [facility]  [address], [place] [phone] Accepts children up to age 73 who are enrolled in [place] or [place]; pregnant women with a Medicaid card; and children who have applied for Medicaid or [place] Health Choice, but were declined, whose parents can  pay a reduced fee at time of service.  [place] Adult [facility] Access PRO[facility]  [place] [phone] Patients are seen by appointment only. Walk-ins are  not accepted. [place] will see patients 7 years of age and older. Monday - Tuesday (8am-5pm) Most Wednesdays (8:30-5pm) $30 per visit, cash only  [facility] Adult Dental Access PROGRAM  [address], [facility], [place] [phone] Patients are seen by appointment only. Walk-ins are not accepted. [place] will see patients 73 years of age and older. One Wednesday Evening (Monthly: Volunteer Based).  $30 per visit, cash only  [place]  [phone] for adults; Children under age 69, call Graduate Pediatric Dentistry at [phone]. Children aged 48-14, please call [phone] to request a pediatric application.  Dental services are provided in all areas of dental care including fillings, crowns and bridges, complete and partial dentures, implants, gum treatment, root canals, and extractions. Preventive care is also provided. Treatment is provided to both adults and children. Patients are selected via a lottery and there is often a waiting list.   [facility] [address], [place]  [phone] [email or web address]   [facility] [address] [place], [place] [phone], Ext. [phone] Second and Fourth Thursday of each month, opens at 6:30 AM; Clinic ends at 9 AM.  Patients are seen on a first-come first-served basis, and a limited number are seen during each clinic.   [facility]  [address] [place], [place] [phone]   Eligibility Requirements You must have lived in [place], [place], or [place] counties for at least the last three months.   You cannot be eligible for state or federal sponsored [employer], including [employer], [place], or [employer].   You generally cannot be eligible for healthcare  insurance through your employer.    How to apply: Eligibility screenings are held every Tuesday and Wednesday afternoon from 1:00 pm until 4:00 pm. You do not need an appointment for the interview!  [facility] [address], [place], [place]   [place] Department  [place]  [phone]    Behavioral Health Resources in the Community: Intensive Outpatient Programs Organization         Address  Phone  Notes  [place]. [address] [place], [place] [phone]   [facility] [address] [place], [place]   ADS: [facility] [address] [place], [place]   [place] [address],  [place], [place] or [phone]   Substance Abuse Resources Organization         Address  Phone  Notes  Al[facility] Drug Services  [phone]   [place]  [phone]   The [place]   Chinita Pester  [phone]   Residential & Outpatient Substance Abuse Program  [phone]   Psychological Services Organization         Address  Phone  Notes  [facility] [place]  [place]  [phone]   [place] [address], [place] or [phone]    Mobile Crisis Teams Organization         Address  Phone  Notes  [facility], Mobile Crisis Care Unit  [phone]   [facility]  [address] [place], [place]   [facility] [address], [place] [phone]    Self-Help/Support Groups  Organization         Address  Phone             Notes  [facility] [place] - variety of support groups  [place] Call for more information  Narcotics Anonymous (NA),  [facility] [address], [employer] [place]  2 meetings at this location   Special educational needs teacher         Address  Phone  Notes  [facility] [place],    [place]  [phone]   [facility]  [address] [place] [id number], [place], [place]   [place], [place] [phone] Admissions: 8am-3pm M-F  [facility] [address],    [place], [place] [phone]   [facility] [address] [place], [place], [place]   [facility] [address]  [place], [place]   [facility] - [address], [place], [place]   [facility] ([place]  [place], [place] [phone] or [phone]   [facility] ([facility]) [address] [place], [place] Accepts Medicaid  [facility] [place] [address],  [place] [phone] Substance Abuse/Addiction Treatment   [facility] Organization         Address  Phone  Notes  [facility]  [phone]   Domenic Schwab, PhD [address] [place], [place]   [phone] or [phone]   [place], [place] [phone]   [facility] [address] [place], [place] [phone] Insurance/Medicaid/sponsorship through [facility] and Families [address], [address] [place]                                    [place], [place] [phone] [place] [address], [place] [phone]    Dr. Adele Schilder  [phone]   [facility] Dept. 1) [address], [place] 2) [place] 3)  [place] [address], [place] [phone]  [phone]    [place] [phone] or [phone] (After Hours)

## 2014-11-23 NOTE — BH Assessment (Signed)
Pt referred to [facility], [employer], [place], and [facility].   Ruben Reason, MA, [facility], [facility]

## 2014-11-23 NOTE — ED Notes (Signed)
Pt ambulated with a steady gait, no assistance to the bathroom for a urine specimen

## 2014-11-23 NOTE — ED Notes (Signed)
Pt with [place] in consultation room for [facility]TS assessment.
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
## 2014-11-23 NOTE — Progress Notes (Signed)
12:55pm. CSW faxed pt's clinicals to [facility] and [facility] for review. Awaiting admissions decisions.   Rochele Pages,     ED CSW  phone: [phone]

## 2014-11-26 ENCOUNTER — Encounter (HOSPITAL_COMMUNITY): Payer: Self-pay | Admitting: *Deleted

## 2014-11-26 ENCOUNTER — Emergency Department (HOSPITAL_COMMUNITY)
Admission: EM | Admit: 2014-11-26 | Discharge: 2014-11-27 | Disposition: A | Discharge status: Home / Self Care | Payer: Self-pay | Attending: Emergency Medicine | Admitting: Emergency Medicine

## 2014-11-26 DIAGNOSIS — G40909 Epilepsy, unspecified, not intractable, without status epilepticus: Secondary | ICD-10-CM | POA: Insufficient documentation

## 2014-11-26 DIAGNOSIS — Z8619 Personal history of other infectious and parasitic diseases: Secondary | ICD-10-CM | POA: Insufficient documentation

## 2014-11-26 DIAGNOSIS — Z862 Personal history of diseases of the blood and blood-forming organs and certain disorders involving the immune mechanism: Secondary | ICD-10-CM | POA: Insufficient documentation

## 2014-11-26 DIAGNOSIS — Z720 Tobacco use: Secondary | ICD-10-CM | POA: Insufficient documentation

## 2014-11-26 DIAGNOSIS — I10 Essential (primary) hypertension: Secondary | ICD-10-CM | POA: Insufficient documentation

## 2014-11-26 DIAGNOSIS — F10929 Alcohol use, unspecified with intoxication, unspecified: Secondary | ICD-10-CM

## 2014-11-26 DIAGNOSIS — G40919 Epilepsy, unspecified, intractable, without status epilepticus: Secondary | ICD-10-CM

## 2014-11-26 DIAGNOSIS — Z21 Asymptomatic human immunodeficiency virus [HIV] infection status: Secondary | ICD-10-CM | POA: Insufficient documentation

## 2014-11-26 DIAGNOSIS — Z79899 Other long term (current) drug therapy: Secondary | ICD-10-CM | POA: Insufficient documentation

## 2014-11-26 DIAGNOSIS — R0902 Hypoxemia: Secondary | ICD-10-CM | POA: Insufficient documentation

## 2014-11-26 DIAGNOSIS — R569 Unspecified convulsions: Secondary | ICD-10-CM

## 2014-11-26 DIAGNOSIS — F10129 Alcohol abuse with intoxication, unspecified: Secondary | ICD-10-CM | POA: Insufficient documentation

## 2014-11-26 HISTORY — DX: Alcohol dependence, uncomplicated: F10.20

## 2014-11-26 LAB — CBG MONITORING, ED: GLUCOSE-CAPILLARY: 128 mg/dL — AB (ref 70–99)

## 2014-11-26 NOTE — ED Provider Notes (Signed)
CSN: [id number]     Arrival date & time 11/26/14  2251 History  This chart was scribed for Julianne Rice, MD by Eustaquio Maize, ED Scribe. This patient was seen in room D36C/D36C and the patient's care was started at 11:29 PM.    Chief Complaint  Patient presents with  . Seizures   The history is provided by the patient and the EMS personnel. No language interpreter was used.     HPI Comments: ANTOWAN SAMFORD is a 54 y.o. male brought in by ambulance, who presents to the Emergency Department complaining of a seizure that occurred earlier tonight. According to EMS, pt has unwitnessed seizure tonight at a grocery store. Pt hadn't had his Keppra in the past 2 days. Pt states his last seizure was approximately 1 month ago. He complains of a headache. Pt believes he hit his head during his seizure. He denies biting his tongue during the episode. Pt admits to drinking alcohol tonight. He denies any other symptoms.      Past Medical History  Diagnosis Date  . Hypertension   . HIV (human immunodeficiency virus infection)   . Seizures   . Immune deficiency disorder   . Hepatitis C   . Alcoholism    History reviewed. No pertinent past surgical history. No family history on file. History  Substance Use Topics  . Smoking status: Current Every Day Smoker -- 1.00 packs/day for 35 years    Types: Cigarettes    Start date: 10/28/2011  . Smokeless tobacco: Never Used     Comment: not ready yet  . Alcohol Use: 0.0 oz/week    0 Standard drinks or equivalent per week     Comment: occasional    Review of Systems  Constitutional: Negative for fever and chills.  HENT: Negative for facial swelling.   Eyes: Negative for visual disturbance.  Respiratory: Negative for shortness of breath.   Cardiovascular: Negative for chest pain.  Gastrointestinal: Negative for nausea, vomiting and abdominal pain.  Musculoskeletal: Negative for myalgias, back pain, neck pain and neck stiffness.  Skin: Negative  for rash and wound.  Neurological: Positive for seizures, syncope and headaches. Negative for dizziness, weakness and light-headedness.  All other systems reviewed and are negative.     Allergies  Review of patient's allergies indicates no known allergies.  Home Medications   Prior to Admission medications   Medication Sig Start Date End Date Taking? Authorizing Provider  ATRIPLA [phone] MG per tablet TAKE 1 TABLET BY MOUTH EVERY DAY 11/04/14  Yes Campbell Riches, MD  lisinopril-hydrochlorothiazide (PRINZIDE,ZESTORETIC) 10-12.5 MG per tablet Take 1 tablet by mouth daily. For high blood pressure 09/03/14  Yes Encarnacion Slates, NP  sildenafil (VIAGRA) 100 MG tablet Take 1 tablet (100 mg total) by mouth daily as needed for erectile dysfunction. 10/14/14  Yes Campbell Riches, MD  levETIRAcetam (KEPPRA) 500 MG tablet Take 1 tablet (500 mg total) by mouth 2 (two) times daily. For seizures 11/27/14   Julianne Rice, MD   BP 124/83 mmHg  Pulse 103  Temp(Src) 97.9 F (36.6 C) (Oral)  Resp 22  SpO2 91%   Physical Exam  Constitutional: He is oriented to person, place, and time. He appears well-developed and well-nourished. No distress.  HENT:  Head: Normocephalic and atraumatic.  Mouth/Throat: Oropharynx is clear and moist.  No obvious scalp trauma. No intraoral trauma.  Eyes: EOM are normal. Pupils are equal, round, and reactive to light.  Neck: Normal range of motion. Neck supple.  No posterior midline cervical tenderness to palpation  Cardiovascular: Normal rate and regular rhythm.  Exam reveals no gallop and no friction rub.   No murmur heard. Pulmonary/Chest: Effort normal and breath sounds normal. No respiratory distress. He has no wheezes. He has no rales. He exhibits no tenderness.  Abdominal: Soft. Bowel sounds are normal. He exhibits no distension and no mass. There is no tenderness. There is no rebound and no guarding.  Musculoskeletal: Normal range of motion. He exhibits no  edema or tenderness.  Full range of motion of all joints.  Neurological: He is alert and oriented to person, place, and time.  Moves all extremities without deficit. Sensation is grossly intact.  Skin: Skin is warm and dry. No rash noted. No erythema.  Psychiatric: He has a normal mood and affect. His behavior is normal.  Nursing note and vitals reviewed.   ED Course  Procedures (including critical care time)  DIAGNOSTIC STUDIES: Oxygen Saturation is 91% on RA, adequate by my interpretation.    COORDINATION OF CARE: 11:35 PM-Discussed treatment plan which includes CBC, CMP, EtOH, CBG with pt at bedside and pt agreed to plan.   Labs Review Labs Reviewed  CBC WITH DIFFERENTIAL/PLATELET - Abnormal; Notable for the following:    Hemoglobin 12.4 (*)    HCT 35.6 (*)    Neutrophils Relative % 30 (*)    Lymphocytes Relative 63 (*)    All other components within normal limits  COMPREHENSIVE METABOLIC PANEL - Abnormal; Notable for the following:    Glucose, Bld 120 (*)    Calcium 7.8 (*)    AST 186 (*)    ALT 158 (*)    All other components within normal limits  ETHANOL - Abnormal; Notable for the following:    Alcohol, Ethyl (B) 233 (*)    All other components within normal limits  CBG MONITORING, ED - Abnormal; Notable for the following:    Glucose-Capillary 128 (*)    All other components within normal limits    Imaging Review Dg Chest 2 View  11/27/2014   CLINICAL DATA:  Hypoxia.  Question seizure.  EXAM: CHEST  2 VIEW  COMPARISON:  07/15/2014  FINDINGS: The cardiomediastinal contours are normal. Lower lung volumes from prior leading to crowding of bronchovascular markings. Pulmonary vasculature is normal. No consolidation, pleural effusion, or pneumothorax. No acute osseous abnormalities are seen.  IMPRESSION: No acute pulmonary process.   Electronically Signed   By: Jeb Levering M.D.   On: 11/27/2014 01:59     EKG Interpretation   Date/Time:  Tuesday November 26 2014  23:44:58 EDT Ventricular Rate:  86 PR Interval:  123 QRS Duration: 89 QT Interval:  394 QTC Calculation: 471 R Axis:   61 Text Interpretation:  Sinus rhythm Borderline T wave abnormalities  Confirmed by Lita Mains  MD, Renell Coaxum ([id number]) on 11/26/2014 11:56:42 PM      MDM   Final diagnoses:  Hypoxia  Alcohol intoxication, with unspecified complication  Breakthrough seizure   I personally performed the services described in this documentation, which was scribed in my presence. The recorded information has been reviewed and is accurate.  Patient with episodic hypoxia in the emergency department. Chest x-ray clear. Likely due to obstructive sleep apnea. His baseline mental status. Advised to take antiepileptics as prescribed. Also advised against imbibing alcohol to excess. Return precautions given.     Julianne Rice, MD 11/27/14 [phone]

## 2014-11-26 NOTE — ED Notes (Signed)
Pt arrives via EMS from grocery store. Pt had an unwitnessed seizure. 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 States that he has not taken the Keppra x2 days because he has been drinking. Has a hx of alcoholism. Pt states he hit the front of his head when he fell. C/o headache and neck pain. Arrived in c-collar by EMS. EMS reports that pt was ambulatory upon their arrival.

## 2014-11-26 NOTE — ED Notes (Signed)
CBG 128 

## 2014-11-27 ENCOUNTER — Emergency Department (HOSPITAL_COMMUNITY): Payer: Self-pay

## 2014-11-27 LAB — COMPREHENSIVE METABOLIC PANEL
ALK PHOS: 77 U/L (ref 39–117)
ALT: 158 U/L — ABNORMAL HIGH (ref 0–53)
ANION GAP: 6 (ref 5–15)
AST: 186 U/L — ABNORMAL HIGH (ref 0–37)
Albumin: 3.5 g/dL (ref 3.5–5.2)
BILIRUBIN TOTAL: 0.4 mg/dL (ref 0.3–1.2)
BUN: 22 mg/dL (ref 6–23)
CHLORIDE: 107 mmol/L (ref 96–112)
CO2: 26 mmol/L (ref 19–32)
Calcium: 7.8 mg/dL — ABNORMAL LOW (ref 8.4–10.5)
Creatinine, Ser: 0.96 mg/dL (ref 0.50–1.35)
GLUCOSE: 120 mg/dL — AB (ref 70–99)
POTASSIUM: 4.2 mmol/L (ref 3.5–5.1)
Sodium: 139 mmol/L (ref 135–145)
Total Protein: 6.5 g/dL (ref 6.0–8.3)

## 2014-11-27 LAB — CBC WITH DIFFERENTIAL/PLATELET
BASOS PCT: 1 % (ref 0–1)
Basophils Absolute: 0 10*3/uL (ref 0.0–0.1)
Eosinophils Absolute: 0.1 10*3/uL (ref 0.0–0.7)
Eosinophils Relative: 1 % (ref 0–5)
HEMATOCRIT: 35.6 % — AB (ref 39.0–52.0)
HEMOGLOBIN: 12.4 g/dL — AB (ref 13.0–17.0)
LYMPHS PCT: 63 % — AB (ref 12–46)
Lymphs Abs: 3.7 10*3/uL (ref 0.7–4.0)
MCH: 29.2 pg (ref 26.0–34.0)
MCHC: 34.8 g/dL (ref 30.0–36.0)
MCV: 84 fL (ref 78.0–100.0)
MONO ABS: 0.3 10*3/uL (ref 0.1–1.0)
Monocytes Relative: 5 % (ref 3–12)
Neutro Abs: 1.8 10*3/uL (ref 1.7–7.7)
Neutrophils Relative %: 30 % — ABNORMAL LOW (ref 43–77)
Platelets: 259 10*3/uL (ref 150–400)
RBC: 4.24 MIL/uL (ref 4.22–5.81)
RDW: 12.7 % (ref 11.5–15.5)
WBC: 5.9 10*3/uL (ref 4.0–10.5)

## 2014-11-27 LAB — ETHANOL: ALCOHOL ETHYL (B): 233 mg/dL — AB (ref 0–9)

## 2014-11-27 MED ORDER — LEVETIRACETAM 500 MG PO TABS: 500.0000 mg | ORAL_TABLET | Freq: Two times a day (BID) | ORAL | Status: DC

## 2014-11-27 MED ORDER — LEVETIRACETAM 500 MG PO TABS
1000.0000 mg | ORAL_TABLET | Freq: Once | ORAL | Status: AC
  Administered 2014-11-27: 1000 mg via ORAL
  Filled 2014-11-27: qty 2

## 2014-11-27 NOTE — Discharge Instructions (Signed)
Alcohol Intoxication Alcohol intoxication occurs when the amount of alcohol that a person has consumed impairs his or her ability to mentally and physically function. Alcohol directly impairs the normal chemical activity of the brain. Drinking large amounts of alcohol can lead to changes in mental function and behavior, and it can cause many physical effects that can be harmful.  Alcohol intoxication can range in severity from mild to very severe. Various factors can affect the level of intoxication that occurs, such as the person's age, gender, weight, frequency of alcohol consumption, and the presence of other medical conditions (such as diabetes, seizures, or heart conditions). Dangerous levels of alcohol intoxication may occur when people drink large amounts of alcohol in a short period (binge drinking). Alcohol can also be especially dangerous when combined with certain prescription medicines or "recreational" drugs. SIGNS AND SYMPTOMS Some common signs and symptoms of mild alcohol intoxication include:  Loss of coordination.  Changes in mood and behavior.  Impaired judgment.  Slurred speech. As alcohol intoxication progresses to more severe levels, other signs and symptoms will appear. These may include:  Vomiting.  Confusion and impaired memory.  Slowed breathing.  Seizures.  Loss of consciousness. DIAGNOSIS  Your health care provider will take a medical history and perform a physical exam. You will be asked about the amount and type of alcohol you have consumed. Blood tests will be done to measure the concentration of alcohol in your blood. In many places, your blood alcohol level must be lower than 80 mg/dL (0.08%) to legally drive. However, many dangerous effects of alcohol can occur at much lower levels.  TREATMENT  People with alcohol intoxication often do not require treatment. Most of the effects of alcohol intoxication are temporary, and they go away as the alcohol naturally  leaves the body. Your health care provider will monitor your condition until you are stable enough to go home. Fluids are sometimes given through an IV access tube to help prevent dehydration.  HOME CARE INSTRUCTIONS  Do not drive after drinking alcohol.  Stay hydrated. Drink enough water and fluids to keep your urine clear or pale yellow. Avoid caffeine.   Only take over-the-counter or prescription medicines as directed by your health care provider.  SEEK MEDICAL CARE IF:   You have persistent vomiting.   You do not feel better after a few days.  You have frequent alcohol intoxication. Your health care provider can help determine if you should see a substance use treatment counselor. SEEK IMMEDIATE MEDICAL CARE IF:   You become shaky or tremble when you try to stop drinking.   You shake uncontrollably (seizure).   You throw up (vomit) blood. This may be bright red or may look like black coffee grounds.   You have blood in your stool. This may be bright red or may appear as a black, tarry, bad smelling stool.   You become lightheaded or faint.  MAKE SURE YOU:   Understand these instructions.  Will watch your condition.  Will get help right away if you are not doing well or get worse. Document Released: 06/02/2005 Document Revised: 04/25/2013 Document Reviewed: 01/26/2013 [facility] Patient Information 2015 [place], [place]. This information is not intended to replace advice given to you by your health care provider. Make sure you discuss any questions you have with your health care provider.  Seizure, Adult A seizure is abnormal electrical activity in the brain. Seizures usually last from 30 seconds to 2 minutes. There are various types of seizures.  Before a seizure, you may have a warning sensation (aura) that a seizure is about to occur. An aura may include the following symptoms:   Fear or anxiety.  Nausea.  Feeling like the room is spinning (vertigo).  Vision  changes, such as seeing flashing lights or spots. Common symptoms during a seizure include:  A change in attention or behavior (altered mental status).  Convulsions with rhythmic jerking movements.  Drooling.  Rapid eye movements.  Grunting.  Loss of bladder and bowel control.  Bitter taste in the mouth.  Tongue biting. After a seizure, you may feel confused and sleepy. You may also have an injury resulting from convulsions during the seizure. HOME CARE INSTRUCTIONS   If you are given medicines, take them exactly as prescribed by your health care provider.  Keep all follow-up appointments as directed by your health care provider.  Do not swim or drive or engage in risky activity during which a seizure could cause further injury to you or others until your health care provider says it is OK.  Get adequate rest.  Teach friends and family what to do if you have a seizure. They should:  Lay you on the ground to prevent a fall.  Put a cushion under your head.  Loosen any tight clothing around your neck.  Turn you on your side. If vomiting occurs, this helps keep your airway clear.  Stay with you until you recover.  Know whether or not you need emergency care. SEEK IMMEDIATE MEDICAL CARE IF:  The seizure lasts longer than 5 minutes.  The seizure is severe or you do not wake up immediately after the seizure.  You have an altered mental status after the seizure.  You are having more frequent or worsening seizures. Someone should drive you to the emergency department or call local emergency services (911 in [place] MAKE SURE YOU:  Understand these instructions.  Will watch your condition.  Will get help right away if you are not doing well or get worse. Document Released: 08/20/2000 Document Revised: 06/13/2013 Document Reviewed: 04/04/2013 [facility] Patient Information 2015 [place], [place]. This information is not intended to replace advice given to you by your  health care provider. Make sure you discuss any questions you have with your health care provider.

## 2014-11-28 ENCOUNTER — Emergency Department (HOSPITAL_COMMUNITY)
Admission: EM | Admit: 2014-11-28 | Discharge: 2014-11-29 | Disposition: A | Discharge status: Home / Self Care | Payer: Self-pay | Attending: Emergency Medicine | Admitting: Emergency Medicine

## 2014-11-28 ENCOUNTER — Encounter (HOSPITAL_COMMUNITY): Payer: Self-pay | Admitting: Emergency Medicine

## 2014-11-28 DIAGNOSIS — R110 Nausea: Secondary | ICD-10-CM | POA: Insufficient documentation

## 2014-11-28 DIAGNOSIS — I10 Essential (primary) hypertension: Secondary | ICD-10-CM | POA: Insufficient documentation

## 2014-11-28 DIAGNOSIS — Z862 Personal history of diseases of the blood and blood-forming organs and certain disorders involving the immune mechanism: Secondary | ICD-10-CM | POA: Insufficient documentation

## 2014-11-28 DIAGNOSIS — Z720 Tobacco use: Secondary | ICD-10-CM | POA: Insufficient documentation

## 2014-11-28 DIAGNOSIS — R519 Headache, unspecified: Secondary | ICD-10-CM

## 2014-11-28 DIAGNOSIS — G40909 Epilepsy, unspecified, not intractable, without status epilepticus: Secondary | ICD-10-CM | POA: Insufficient documentation

## 2014-11-28 DIAGNOSIS — Z21 Asymptomatic human immunodeficiency virus [HIV] infection status: Secondary | ICD-10-CM | POA: Insufficient documentation

## 2014-11-28 DIAGNOSIS — R197 Diarrhea, unspecified: Secondary | ICD-10-CM | POA: Insufficient documentation

## 2014-11-28 DIAGNOSIS — R5383 Other fatigue: Secondary | ICD-10-CM | POA: Insufficient documentation

## 2014-11-28 DIAGNOSIS — Z8619 Personal history of other infectious and parasitic diseases: Secondary | ICD-10-CM | POA: Insufficient documentation

## 2014-11-28 DIAGNOSIS — Z79899 Other long term (current) drug therapy: Secondary | ICD-10-CM | POA: Insufficient documentation

## 2014-11-28 DIAGNOSIS — R109 Unspecified abdominal pain: Secondary | ICD-10-CM | POA: Insufficient documentation

## 2014-11-28 DIAGNOSIS — R51 Headache: Secondary | ICD-10-CM | POA: Insufficient documentation

## 2014-11-28 LAB — URINALYSIS, ROUTINE W REFLEX MICROSCOPIC
BILIRUBIN URINE: NEGATIVE
GLUCOSE, UA: NEGATIVE mg/dL
HGB URINE DIPSTICK: NEGATIVE
Ketones, ur: NEGATIVE mg/dL
Leukocytes, UA: NEGATIVE
Nitrite: NEGATIVE
PROTEIN: NEGATIVE mg/dL
Specific Gravity, Urine: 1.024 (ref 1.005–1.030)
UROBILINOGEN UA: 1 mg/dL (ref 0.0–1.0)
pH: 5.5 (ref 5.0–8.0)

## 2014-11-28 LAB — COMPREHENSIVE METABOLIC PANEL
ALBUMIN: 3.7 g/dL (ref 3.5–5.2)
ALK PHOS: 74 U/L (ref 39–117)
ALT: 145 U/L — ABNORMAL HIGH (ref 0–53)
ANION GAP: 11 (ref 5–15)
AST: 160 U/L — ABNORMAL HIGH (ref 0–37)
BUN: 22 mg/dL (ref 6–23)
CHLORIDE: 107 mmol/L (ref 96–112)
CO2: 22 mmol/L (ref 19–32)
CREATININE: 1.28 mg/dL (ref 0.50–1.35)
Calcium: 8.7 mg/dL (ref 8.4–10.5)
GFR calc non Af Amer: 62 mL/min — ABNORMAL LOW (ref >90)
GFR, EST AFRICAN AMERICAN: 72 mL/min — AB (ref >90)
GLUCOSE: 102 mg/dL — AB (ref 70–99)
POTASSIUM: 3.6 mmol/L (ref 3.5–5.1)
SODIUM: 140 mmol/L (ref 135–145)
TOTAL PROTEIN: 7.1 g/dL (ref 6.0–8.3)
Total Bilirubin: 0.8 mg/dL (ref 0.3–1.2)

## 2014-11-28 LAB — CBC WITH DIFFERENTIAL/PLATELET
BASOS ABS: 0 10*3/uL (ref 0.0–0.1)
BASOS PCT: 0 % (ref 0–1)
EOS ABS: 0.1 10*3/uL (ref 0.0–0.7)
EOS PCT: 1 % (ref 0–5)
HCT: 38.9 % — ABNORMAL LOW (ref 39.0–52.0)
Hemoglobin: 12.9 g/dL — ABNORMAL LOW (ref 13.0–17.0)
Lymphocytes Relative: 48 % — ABNORMAL HIGH (ref 12–46)
Lymphs Abs: 3.2 10*3/uL (ref 0.7–4.0)
MCH: 28.1 pg (ref 26.0–34.0)
MCHC: 33.2 g/dL (ref 30.0–36.0)
MCV: 84.7 fL (ref 78.0–100.0)
MONOS PCT: 6 % (ref 3–12)
Monocytes Absolute: 0.4 10*3/uL (ref 0.1–1.0)
NEUTROS PCT: 45 % (ref 43–77)
Neutro Abs: 3 10*3/uL (ref 1.7–7.7)
PLATELETS: 259 10*3/uL (ref 150–400)
RBC: 4.59 MIL/uL (ref 4.22–5.81)
RDW: 12.4 % (ref 11.5–15.5)
WBC: 6.7 10*3/uL (ref 4.0–10.5)

## 2014-11-28 LAB — LIPASE, BLOOD: Lipase: 43 U/L (ref 11–59)

## 2014-11-28 MED ORDER — KETOROLAC TROMETHAMINE 30 MG/ML IJ SOLN
30.0000 mg | Freq: Once | INTRAMUSCULAR | Status: AC
  Administered 2014-11-28: 30 mg via INTRAVENOUS
  Filled 2014-11-28: qty 1

## 2014-11-28 MED ORDER — SODIUM CHLORIDE 0.9 % IV BOLUS (SEPSIS)
1000.0000 mL | Freq: Once | INTRAVENOUS | Status: AC
Start: 2014-11-28 — End: 2014-11-29
  Administered 2014-11-28: 1000 mL via INTRAVENOUS

## 2014-11-28 MED ORDER — METOCLOPRAMIDE HCL 5 MG/ML IJ SOLN
10.0000 mg | Freq: Once | INTRAMUSCULAR | Status: AC
Start: 2014-11-28 — End: 2014-11-28
  Administered 2014-11-28: 10 mg via INTRAVENOUS
  Filled 2014-11-28: qty 2

## 2014-11-28 MED ORDER — DIPHENHYDRAMINE HCL 50 MG/ML IJ SOLN
25.0000 mg | Freq: Once | INTRAMUSCULAR | Status: AC
  Administered 2014-11-28: 25 mg via INTRAVENOUS
  Filled 2014-11-28: qty 1

## 2014-11-28 NOTE — ED Notes (Signed)
MD at the bedside  

## 2014-11-28 NOTE — ED Provider Notes (Signed)
CSN: [id number]     Arrival date & time 11/28/14  2012 History  This chart was scribed for Everlene Balls, MD by Randa Evens, ED Scribe. This patient was seen in room A04C/A04C and the patient's care was started at 11:06 PM.     Chief Complaint  Patient presents with  . Abdominal Pain  . Diarrhea  . Headache   Patient is a 54 y.o. male presenting with abdominal pain, diarrhea, and headaches. The history is provided by the patient. No language interpreter was used.  Abdominal Pain Pain radiates to:  Does not radiate Pain severity:  Mild Onset quality:  Gradual Duration:  3 days Timing:  Intermittent Relieved by:  Nothing Worsened by:  Nothing tried Ineffective treatments:  None tried Associated symptoms: diarrhea, fatigue and nausea   Associated symptoms: no dysuria, no fever, no hematuria and no vomiting   Diarrhea Associated symptoms: abdominal pain and headaches   Associated symptoms: no fever and no vomiting   Headache Associated symptoms: abdominal pain, diarrhea, fatigue and nausea   Associated symptoms: no fever, no numbness, no vomiting and no weakness    HPI Comments: Isaac [name] is a 54 y.o. male who presents to the Emergency Department complaing of new abdominal pain onset 3 days prior. Pt states he has associated worsening HA, nausea and intermittent bloody diarrhea x5. Pt states that he also feels as if he is fatigue. Pt describes his headache as making him feel light headed. Pt states that he feels as if he is under a lot of stress due to a certain situation he is currently in. Pt denies any suspect food intake. Pt denies vomiting, hematuria, dysuria, numbness or weakness.   Past Medical History  Diagnosis Date  . Hypertension   . HIV (human immunodeficiency virus infection)   . Seizures   . Immune deficiency disorder   . Hepatitis C   . Alcoholism    History reviewed. No pertinent past surgical history. No family history on file. History  Substance Use  Topics  . Smoking status: Current Every Day Smoker -- 1.00 packs/day for 35 years    Types: Cigarettes    Start date: 10/28/2011  . Smokeless tobacco: Never Used     Comment: not ready yet  . Alcohol Use: Yes     Comment: 2-3 quarts -- mix beer/wine/liquor    Review of Systems  Constitutional: Positive for fatigue. Negative for fever.  Gastrointestinal: Positive for nausea, abdominal pain and diarrhea. Negative for vomiting.  Genitourinary: Negative for dysuria and hematuria.  Neurological: Positive for headaches. Negative for weakness and numbness.  All other systems reviewed and are negative.     Allergies  Review of patient's allergies indicates no known allergies.  Home Medications   Prior to Admission medications   Medication Sig Start Date End Date Taking? Authorizing Provider  ATRIPLA [phone] MG per tablet TAKE 1 TABLET BY MOUTH EVERY DAY 11/04/14  Yes Campbell Riches, MD  ibuprofen (ADVIL,MOTRIN) 200 MG tablet Take 400 mg by mouth every 6 (six) hours as needed for mild pain or moderate pain.   Yes Historical Provider, MD  levETIRAcetam (KEPPRA) 500 MG tablet Take 1 tablet (500 mg total) by mouth 2 (two) times daily. For seizures 11/27/14  Yes Julianne Rice, MD  lisinopril-hydrochlorothiazide (PRINZIDE,ZESTORETIC) 10-12.5 MG per tablet Take 1 tablet by mouth daily. For high blood pressure 09/03/14  Yes Encarnacion Slates, NP  sildenafil (VIAGRA) 100 MG tablet Take 1 tablet (100 mg total) by  mouth daily as needed for erectile dysfunction. 10/14/14  Yes Campbell Riches, MD   BP 113/58 mmHg  Pulse 93  Temp(Src) 98.7 F (37.1 C) (Oral)  Resp 16  Ht 5\' 9"  (1.753 m)  Wt 160 lb (72.576 kg)  BMI 23.62 kg/m2  SpO2 99%   Physical Exam  Constitutional: He is oriented to person, place, and time. Vital signs are normal. He appears well-developed and well-nourished.  Non-toxic appearance. He does not appear ill. No distress.  HENT:  Head: Normocephalic and atraumatic.  Nose:  Nose normal.  Mouth/Throat: Oropharynx is clear and moist. No oropharyngeal exudate.  Eyes: Conjunctivae and EOM are normal. Pupils are equal, round, and reactive to light. No scleral icterus.  Neck: Normal range of motion. Neck supple. No tracheal deviation, no edema, no erythema and normal range of motion present. No thyroid mass and no thyromegaly present.  Cardiovascular: Normal rate, regular rhythm, S1 normal, S2 normal, normal heart sounds, intact distal pulses and normal pulses.  Exam reveals no gallop and no friction rub.   No murmur heard. Pulses:      Radial pulses are 2+ on the right side, and 2+ on the left side.       Dorsalis pedis pulses are 2+ on the right side, and 2+ on the left side.  Pulmonary/Chest: Effort normal and breath sounds normal. No respiratory distress. He has no wheezes. He has no rhonchi. He has no rales.  Abdominal: Soft. Normal appearance and bowel sounds are normal. He exhibits no distension, no ascites and no mass. There is no hepatosplenomegaly. There is no tenderness. There is no rebound, no guarding and no CVA tenderness.  Musculoskeletal: Normal range of motion. He exhibits no edema or tenderness.  Lymphadenopathy:    He has no cervical adenopathy.  Neurological: He is alert and oriented to person, place, and time. He has normal strength. No cranial nerve deficit or sensory deficit.  Skin: Skin is warm, dry and intact. No petechiae and no rash noted. He is not diaphoretic. No erythema. No pallor.  Psychiatric: He has a normal mood and affect. His behavior is normal. Judgment normal.  Nursing note and vitals reviewed.   ED Course  Procedures (including critical care time) DIAGNOSTIC STUDIES: Oxygen Saturation is 99% on RA, normal by my interpretation.    COORDINATION OF CARE: 11:30 PM-Discussed treatment plan with pt at bedside and pt agreed to plan.     Labs Review Labs Reviewed  CBC WITH DIFFERENTIAL/PLATELET - Abnormal; Notable for the  following:    Hemoglobin 12.9 (*)    HCT 38.9 (*)    Lymphocytes Relative 48 (*)    All other components within normal limits  COMPREHENSIVE METABOLIC PANEL - Abnormal; Notable for the following:    Glucose, Bld 102 (*)    AST 160 (*)    ALT 145 (*)    GFR calc non Af Amer 62 (*)    GFR calc Af Amer 72 (*)    All other components within normal limits  LIPASE, BLOOD  URINALYSIS, ROUTINE W REFLEX MICROSCOPIC    Imaging Review Dg Chest 2 View  11/27/2014   CLINICAL DATA:  Hypoxia.  Question seizure.  EXAM: CHEST  2 VIEW  COMPARISON:  07/15/2014  FINDINGS: The cardiomediastinal contours are normal. Lower lung volumes from prior leading to crowding of bronchovascular markings. Pulmonary vasculature is normal. No consolidation, pleural effusion, or pneumothorax. No acute osseous abnormalities are seen.  IMPRESSION: No acute pulmonary process.  Electronically Signed   By: Jeb Levering M.D.   On: 11/27/2014 01:59     EKG Interpretation None      MDM   Final diagnoses:  None   Patient presents emergency department for diffuse abdominal pain and diarrhea. He states he has had hematochezia but hemoglobin is stable at 12.9. Blood is intermittent and not constant, likely related to the diarrhea. He was given IV fluids, Toradol, Reglan, Benadryl for his headache and nausea. Patient appears well, his vital signs remained within his normal limits. His headache was not acute in onset and likely related to his viral disease. I will concern for meningitis or subarachnoid hemorrhage in this patient. Patient is newly homeless and is requesting to stay the night.  Since we are not busy, the patient was allowed to sleep in the ED until the morning.  He is safe for discharge and for regular follow-up with 3 days.  I personally performed the services described in this documentation, which was scribed in my presence. The recorded information has been reviewed and is accurate.    Everlene Balls,  MD 11/29/14 [phone]

## 2014-11-28 NOTE — ED Notes (Addendum)
H/a and diarrhea started yesterday-- was seen at [facility] 2 days ago for seizure-- hx of seizures. States has had 4-5 diarrheal stools with blood in stool, Pt also states he is homeless and has been sleeping in abandoned cars  Has been drinking daily up until Tuesday-- has never been through withdrawal symptoms--

## 2014-11-28 NOTE — Discharge Instructions (Signed)
General Headache Without Cause Mr. Dugo, continue to stay well hydrated as this diarrhea runs its course. Take Motrin or Tylenol as needed for headache. Follow-up with your primary care physician within 3 days for continued management. If symptoms worsen come back to the emergency department immediately. Thank you. A general headache is pain or discomfort felt around the head or neck area. The cause may not be found.  HOME CARE   Keep all doctor visits.  Only take medicines as told by your doctor.  Lie down in a dark, quiet room when you have a headache.  Keep a journal to find out if certain things bring on headaches. For example, write down:  What you eat and drink.  How much sleep you get.  Any change to your diet or medicines.  Relax by getting a massage or doing other relaxing activities.  Put ice or heat packs on the head and neck area as told by your doctor.  Lessen stress.  Sit up straight. Do not tighten (tense) your muscles.  Quit smoking if you smoke.  Lessen how much alcohol you drink.  Lessen how much caffeine you drink, or stop drinking caffeine.  Eat and sleep on a regular schedule.  Get 7 to 9 hours of sleep, or as told by your doctor.  Keep lights dim if bright lights bother you or make your headaches worse. GET HELP RIGHT AWAY IF:   Your headache becomes really bad.  You have a fever.  You have a stiff neck.  You have trouble seeing.  Your muscles are weak, or you lose muscle control.  You lose your balance or have trouble walking.  You feel like you will pass out (faint), or you pass out.  You have really bad symptoms that are different than your first symptoms.  You have problems with the medicines given to you by your doctor.  Your medicines do not work.  Your headache feels different than the other headaches.  You feel sick to your stomach (nauseous) or throw up (vomit). MAKE SURE YOU:   Understand these instructions.  Will  watch your condition.  Will get help right away if you are not doing well or get worse. Document Released: 06/01/2008 Document Revised: 11/15/2011 Document Reviewed: 08/13/2011 [facility] Patient Information 2015 [place], [place]. This information is not intended to replace advice given to you by your health care provider. Make sure you discuss any questions you have with your health care provider. Diarrhea Diarrhea is watery poop (stool). It can make you feel weak, tired, thirsty, or give you a dry mouth (signs of dehydration). Watery poop is a sign of another problem, most often an infection. It often lasts 2-3 days. It can last longer if it is a sign of something serious. Take care of yourself as told by your doctor. HOME CARE   Drink 1 cup (8 ounces) of fluid each time you have watery poop.  Do not drink the following fluids:  Those that contain simple sugars (fructose, glucose, galactose, lactose, sucrose, maltose).  Sports drinks.  Fruit juices.  Whole milk products.  Sodas.  Drinks with caffeine (coffee, tea, soda) or alcohol.  Oral rehydration solution may be used if the doctor says it is okay. You may make your own solution. Follow this recipe:   - teaspoon table salt.   teaspoon baking soda.   teaspoon salt substitute containing potassium chloride.  1 tablespoons sugar.  1 liter (34 ounces) of water.  Avoid the following foods:  High fiber foods, such as raw fruits and vegetables.  Nuts, seeds, and whole grain breads and cereals.   Those that are sweetened with sugar alcohols (xylitol, sorbitol, mannitol).  Try eating the following foods:  Starchy foods, such as rice, toast, pasta, low-sugar cereal, oatmeal, baked potatoes, crackers, and bagels.  Bananas.  Applesauce.  Eat probiotic-rich foods, such as yogurt and milk products that are fermented.  Wash your hands well after each time you have watery poop.  Only take medicine as told by your  doctor.  Take a warm bath to help lessen burning or pain from having watery poop. GET HELP RIGHT AWAY IF:   You cannot drink fluids without throwing up (vomiting).  You keep throwing up.  You have blood in your poop, or your poop looks black and tarry.  You do not pee (urinate) in 6-8 hours, or there is only a small amount of very dark pee.  You have belly (abdominal) pain that gets worse or stays in the same spot (localizes).  You are weak, dizzy, confused, or light-headed.  You have a very bad headache.  Your watery poop gets worse or does not get better.  You have a fever or lasting symptoms for more than 2-3 days.  You have a fever and your symptoms suddenly get worse. MAKE SURE YOU:   Understand these instructions.  Will watch your condition.  Will get help right away if you are not doing well or get worse. Document Released: 02/09/2008 Document Revised: 01/07/2014 Document Reviewed: 04/30/2012 [facility] Patient Information 2015 [place], [place]. This information is not intended to replace advice given to you by your health care provider. Make sure you discuss any questions you have with your health care provider.

## 2014-12-07 ENCOUNTER — Emergency Department (HOSPITAL_COMMUNITY)
Admission: EM | Admit: 2014-12-07 | Discharge: 2014-12-08 | Disposition: A | Discharge status: Other Acute Inpatient Hospital | Payer: Self-pay | Attending: Emergency Medicine | Admitting: Emergency Medicine

## 2014-12-07 ENCOUNTER — Encounter (HOSPITAL_COMMUNITY): Payer: Self-pay

## 2014-12-07 DIAGNOSIS — R45851 Suicidal ideations: Secondary | ICD-10-CM

## 2014-12-07 DIAGNOSIS — R300 Dysuria: Secondary | ICD-10-CM | POA: Insufficient documentation

## 2014-12-07 DIAGNOSIS — Z720 Tobacco use: Secondary | ICD-10-CM | POA: Insufficient documentation

## 2014-12-07 DIAGNOSIS — F1010 Alcohol abuse, uncomplicated: Secondary | ICD-10-CM | POA: Insufficient documentation

## 2014-12-07 DIAGNOSIS — F32A Depression, unspecified: Secondary | ICD-10-CM

## 2014-12-07 DIAGNOSIS — Z21 Asymptomatic human immunodeficiency virus [HIV] infection status: Secondary | ICD-10-CM | POA: Insufficient documentation

## 2014-12-07 DIAGNOSIS — Z8619 Personal history of other infectious and parasitic diseases: Secondary | ICD-10-CM | POA: Insufficient documentation

## 2014-12-07 DIAGNOSIS — F329 Major depressive disorder, single episode, unspecified: Secondary | ICD-10-CM | POA: Insufficient documentation

## 2014-12-07 DIAGNOSIS — G40909 Epilepsy, unspecified, not intractable, without status epilepticus: Secondary | ICD-10-CM | POA: Insufficient documentation

## 2014-12-07 DIAGNOSIS — Z79899 Other long term (current) drug therapy: Secondary | ICD-10-CM | POA: Insufficient documentation

## 2014-12-07 DIAGNOSIS — F1414 Cocaine abuse with cocaine-induced mood disorder: Secondary | ICD-10-CM | POA: Insufficient documentation

## 2014-12-07 DIAGNOSIS — F1210 Cannabis abuse, uncomplicated: Secondary | ICD-10-CM | POA: Insufficient documentation

## 2014-12-07 DIAGNOSIS — I10 Essential (primary) hypertension: Secondary | ICD-10-CM | POA: Insufficient documentation

## 2014-12-07 LAB — COMPREHENSIVE METABOLIC PANEL
ALBUMIN: 3.6 g/dL (ref 3.5–5.2)
ALT: 153 U/L — ABNORMAL HIGH (ref 0–53)
AST: 267 U/L — ABNORMAL HIGH (ref 0–37)
Alkaline Phosphatase: 68 U/L (ref 39–117)
Anion gap: 14 (ref 5–15)
BILIRUBIN TOTAL: 0.3 mg/dL (ref 0.3–1.2)
BUN: 21 mg/dL (ref 6–23)
CO2: 17 mmol/L — ABNORMAL LOW (ref 19–32)
Calcium: 8.2 mg/dL — ABNORMAL LOW (ref 8.4–10.5)
Chloride: 105 mmol/L (ref 96–112)
Creatinine, Ser: 1.18 mg/dL (ref 0.50–1.35)
GFR calc Af Amer: 80 mL/min — ABNORMAL LOW (ref >90)
GFR calc non Af Amer: 69 mL/min — ABNORMAL LOW (ref >90)
Glucose, Bld: 92 mg/dL (ref 70–99)
POTASSIUM: 3.3 mmol/L — AB (ref 3.5–5.1)
Sodium: 136 mmol/L (ref 135–145)
Total Protein: 7.4 g/dL (ref 6.0–8.3)

## 2014-12-07 LAB — CBC
HCT: 36.2 % — ABNORMAL LOW (ref 39.0–52.0)
Hemoglobin: 12 g/dL — ABNORMAL LOW (ref 13.0–17.0)
MCH: 28.3 pg (ref 26.0–34.0)
MCHC: 33.1 g/dL (ref 30.0–36.0)
MCV: 85.4 fL (ref 78.0–100.0)
PLATELETS: 260 10*3/uL (ref 150–400)
RBC: 4.24 MIL/uL (ref 4.22–5.81)
RDW: 13.1 % (ref 11.5–15.5)
WBC: 6 10*3/uL (ref 4.0–10.5)

## 2014-12-07 LAB — URINALYSIS, ROUTINE W REFLEX MICROSCOPIC
Bilirubin Urine: NEGATIVE
Glucose, UA: NEGATIVE mg/dL
HGB URINE DIPSTICK: NEGATIVE
Ketones, ur: NEGATIVE mg/dL
Leukocytes, UA: NEGATIVE
Nitrite: NEGATIVE
PH: 5.5 (ref 5.0–8.0)
PROTEIN: NEGATIVE mg/dL
Specific Gravity, Urine: 1.021 (ref 1.005–1.030)
Urobilinogen, UA: 1 mg/dL (ref 0.0–1.0)

## 2014-12-07 LAB — ACETAMINOPHEN LEVEL: Acetaminophen (Tylenol), Serum: <10 ug/mL — ABNORMAL LOW (ref 10–30)

## 2014-12-07 LAB — RAPID URINE DRUG SCREEN, HOSP PERFORMED
AMPHETAMINES: NOT DETECTED
BENZODIAZEPINES: NOT DETECTED
Barbiturates: NOT DETECTED
COCAINE: POSITIVE — AB
Opiates: NOT DETECTED
TETRAHYDROCANNABINOL: POSITIVE — AB

## 2014-12-07 LAB — SALICYLATE LEVEL: Salicylate Lvl: <4 mg/dL (ref 2.8–20.0)

## 2014-12-07 LAB — ETHANOL: ALCOHOL ETHYL (B): 41 mg/dL — AB (ref 0–9)

## 2014-12-07 MED ORDER — ZOLPIDEM TARTRATE 5 MG PO TABS: 5.0000 mg | ORAL_TABLET | Freq: Every evening | ORAL | Status: DC | PRN

## 2014-12-07 MED ORDER — ONDANSETRON HCL 4 MG PO TABS: 4.0000 mg | ORAL_TABLET | Freq: Three times a day (TID) | ORAL | Status: DC | PRN

## 2014-12-07 MED ORDER — LORAZEPAM 1 MG PO TABS: 1.0000 mg | ORAL_TABLET | Freq: Three times a day (TID) | ORAL | Status: DC | PRN

## 2014-12-07 MED ORDER — THIAMINE HCL 100 MG/ML IJ SOLN: 100.0000 mg | Freq: Every day | INTRAMUSCULAR | Status: DC

## 2014-12-07 MED ORDER — ALUM & MAG HYDROXIDE-SIMETH 200-200-20 MG/5ML PO SUSP: 30.0000 mL | ORAL | Status: DC | PRN

## 2014-12-07 MED ORDER — IBUPROFEN 800 MG PO TABS
800.0000 mg | ORAL_TABLET | Freq: Once | ORAL | Status: AC
  Administered 2014-12-07: 800 mg via ORAL
  Filled 2014-12-07: qty 1

## 2014-12-07 MED ORDER — LORAZEPAM 1 MG PO TABS: 0.0000 mg | ORAL_TABLET | Freq: Two times a day (BID) | ORAL | Status: DC

## 2014-12-07 MED ORDER — LORAZEPAM 1 MG PO TABS: 0.0000 mg | ORAL_TABLET | Freq: Four times a day (QID) | ORAL | Status: DC

## 2014-12-07 MED ORDER — IBUPROFEN 200 MG PO TABS: 600.0000 mg | ORAL_TABLET | Freq: Three times a day (TID) | ORAL | Status: DC | PRN

## 2014-12-07 MED ORDER — VITAMIN B-1 100 MG PO TABS
100.0000 mg | ORAL_TABLET | Freq: Every day | ORAL | Status: DC
  Administered 2014-12-07: 100 mg via ORAL
  Filled 2014-12-07: qty 1

## 2014-12-07 MED ORDER — POTASSIUM CHLORIDE CRYS ER 20 MEQ PO TBCR
40.0000 meq | EXTENDED_RELEASE_TABLET | Freq: Once | ORAL | Status: AC
  Administered 2014-12-07: 40 meq via ORAL
  Filled 2014-12-07: qty 2

## 2014-12-07 MED ORDER — NICOTINE 21 MG/24HR TD PT24
21.0000 mg | MEDICATED_PATCH | Freq: Every day | TRANSDERMAL | Status: DC
  Administered 2014-12-07: 21 mg via TRANSDERMAL
  Filled 2014-12-07: qty 1

## 2014-12-07 NOTE — BH Assessment (Signed)
Assessment Note  Isaac [name] is an 54 y.o. male who reports walking to [facility] after experiencing SI with a plan for the past 3 days.  The Patient presented orientated x4, mood "depressed", affect congruent with mood, reports current SI with a plan to jump off a bridge onto train tracks, denied HI and AVH.  The Patient reports over the past month he became homeless, is unemployed, a relationship end, he relapsed to alcohol use, and he has no family.  He reports sleeping  3 to 4 hours per night due to being homeless and also racing thoughts, has lost 12 pounds over the past month due to poor appetite, self isolates, and is tearful.  The Patient reports consuming 2 quarts of beer daily, smoking 1/2 of Cannabis blunt 2x in the past 30 days, and smoking crack cocaine 6x of $20 worth in the past 30 days.  The Patient reports last consuming alcohol yesterday, smoking Cannabis on 12-05-2014, and smoking crack cocaine on 12-02-2014.  The Patient denied previous psychiatric hospitalization or outpatient mental health treatment.  He reports residential substance use treatment at [facility] in [facility] in December 2015.  The Patient reports wanting inpatient treatment for depression and alcohol use.    Axis I: Major Depression, single episode and Alcohol Use Disorder, moderate, Cannabis Use Disorder, mild, Stimulant Use Disorder, moderate Axis II: Deferred Axis IV: economic problems, housing problems, occupational problems and problems with primary support group Axis V: 11-20 some danger of hurting self or others possible OR occasionally fails to maintain minimal personal hygiene OR gross impairment in communication  Past Medical History:  Past Medical History  Diagnosis Date  . Hypertension   . HIV (human immunodeficiency virus infection)   . Seizures   . Immune deficiency disorder   . Hepatitis C   . Alcoholism     History reviewed. No pertinent past surgical history.  Family History: History reviewed. No  pertinent family history.  Social History:  reports that he has been smoking Cigarettes.  He started smoking about 3 years ago. He has a 17.5 pack-year smoking history. He has never used smokeless tobacco. He reports that he drinks alcohol. He reports that he uses illicit drugs (Cocaine).  Additional Social History:     CIWA: CIWA-Ar BP: 134/88 mmHg Pulse Rate: 96 COWS:    Allergies: No Known Allergies  Home Medications:  (Not in a hospital admission)  OB/GYN Status:  No LMP for male patient.  General Assessment Data Location of Assessment: [facility] ED ACT Assessment: Yes Is this a Tele or Face-to-Face Assessment?: Tele Assessment Is this an Initial Assessment or a Re-assessment for this encounter?: Initial Assessment Living Arrangements: Other (Comment) (Homeless) Can pt return to current living arrangement?: Yes Admission Status: Voluntary Is patient capable of signing voluntary admission?: Yes Transfer from: Other (Comment) (Patient walked to [facility]) Referral Source: Self/Family/Friend  Medical Screening Exam ([place]) Medical Exam completed: Yes  [place] Living Arrangements: Other (Comment) (Homeless) Name of Psychiatrist: N/A Name of Therapist: N/A  Education Status Is patient currently in school?: No Current Grade: N/A Highest grade of school patient has completed: N/A Name of school: N/A Contact person: N/A  Risk to self with the past 6 months Suicidal Ideation: Yes-Currently Present Suicidal Intent: Yes-Currently Present Is patient at risk for suicide?: Yes Suicidal Plan?: Yes-Currently Present Specify Current Suicidal Plan: Patient reports a plan to jump off a bridge Access to Means: No What has been your use of drugs/alcohol  within the last 12 months?: ETOH, THC and Cocaine Previous Attempts/Gestures: Yes How many times?: 1 Other Self Harm Risks: None Triggers for Past Attempts: Other (Comment) (Depression) Intentional Self Injurious  Behavior: None Family Suicide History: No Recent stressful life event(s): Other (Comment) (homeless, uinemployed, relapse to ETOH use, end relationship) Persecutory voices/beliefs?: No Depression: Yes Depression Symptoms: Insomnia, Tearfulness, Isolating, Feeling worthless/self pity, Despondent Substance abuse history and/or treatment for substance abuse?: Yes Suicide prevention information given to non-admitted patients: Not applicable  Risk to Others within the past 6 months Homicidal Ideation: No Thoughts of Harm to Others: No Current Homicidal Intent: No Current Homicidal Plan: No Access to Homicidal Means: No Identified Victim: N/A History of harm to others?: No Assessment of Violence: None Noted Violent Behavior Description: N/A Does patient have access to weapons?: No (Patient denied owning a gun) Criminal Charges Pending?: No Does patient have a court date: No  Psychosis Hallucinations: None noted Delusions: None noted  Mental Status Report Appearance/Hygiene: In hospital gown Eye Contact: Fair Motor Activity: Restlessness Speech: Logical/coherent Level of Consciousness: Alert Mood: Depressed, Anxious Affect: Anxious, Depressed, Sad Anxiety Level: Moderate Thought Processes: Coherent, Relevant Judgement: Impaired Orientation: Person, Place, Time, Situation Obsessive Compulsive Thoughts/Behaviors: None  Cognitive Functioning Concentration: Decreased Memory: Recent Intact, Remote Intact IQ: Average Insight: Poor Impulse Control: Fair Appetite: Poor Weight Loss: 12 Weight Gain: 0 Sleep: Decreased Total Hours of Sleep: 4 Vegetative Symptoms: None  ADLScreening [facility] Assessment Services) Patient's cognitive ability adequate to safely complete daily activities?: Yes Patient able to express need for assistance with ADLs?: Yes Independently performs ADLs?: Yes (appropriate for developmental age)  Prior Inpatient Therapy Prior Inpatient Therapy: No Prior  Therapy Dates: None Prior Therapy Facilty/Provider(s): N/A Reason for Treatment: N/A  Prior Outpatient Therapy Prior Outpatient Therapy: No Prior Therapy Dates: N/A Prior Therapy Facilty/Provider(s): N/A Reason for Treatment: N/A  ADL Screening (condition at time of admission) Patient's cognitive ability adequate to safely complete daily activities?: Yes Is the patient deaf or have difficulty hearing?: No Does the patient have difficulty seeing, even when wearing glasses/contacts?: No Does the patient have difficulty concentrating, remembering, or making decisions?: No Patient able to express need for assistance with ADLs?: Yes Does the patient have difficulty dressing or bathing?: No Independently performs ADLs?: Yes (appropriate for developmental age) Does the patient have difficulty walking or climbing stairs?: No Weakness of Legs: None Weakness of Arms/Hands: None  Home Assistive Devices/Equipment Home Assistive Devices/Equipment: None    Abuse/Neglect Assessment (Assessment to be complete while patient is alone) Physical Abuse: Denies Verbal Abuse: Denies Sexual Abuse: Denies Exploitation of patient/patient's resources: Denies Self-Neglect: Denies Values / Beliefs Cultural Requests During Hospitalization: None Spiritual Requests During Hospitalization: None   Advance Directives (For Healthcare) Does patient have an advance directive?: No Would patient like information on creating an advanced directive?: No - patient declined information    Additional Information 1:1 In Past 12 Months?: No CIRT Risk: No Elopement Risk: No Does patient have medical clearance?: Yes     Disposition:  Disposition Initial Assessment Completed for this Encounter: Yes Disposition of Patient: Inpatient treatment program Type of inpatient treatment program: Adult  On Site Evaluation by:   Reviewed with Physician:    Dey-Johnson,Deren Degrazia 12/07/2014 10:20 PM

## 2014-12-07 NOTE — ED Notes (Signed)
Onset 1 week pt feeling fatigued and depressed.  Onset 3-4 days SI, wants to jump off bridge at [employer] bridge.  Pt is homeless and unemployed.  Pt not sleeping since there is no room at homeless shelters.

## 2014-12-07 NOTE — ED Notes (Signed)
Report given to [facility].  Waiting for [place] for transportation.  Patient calm sitting on side of the bed at this time eating a bag lunch.  No distress noted or complaints voiced.

## 2014-12-07 NOTE — ED Provider Notes (Signed)
CSN: [id number]     Arrival date & time 12/07/14  1812 History   First MD Initiated Contact with Patient 12/07/14 2002     Chief Complaint  Patient presents with  . Suicidal  . Fatigue     (Consider location/radiation/quality/duration/timing/severity/associated sxs/prior Treatment) The history is provided by the patient and medical records. No language interpreter was used.     Isaac [name] is a 54 y.o. male  with a hx of HTN, HIV, seizures, Hep C, alcoholism presents to the Emergency Department complaining of gradual, persistent, progressively worsening SI onset > 1.5 months worsening until he made a plan to jump off a bridge.  Pt reports he has had many stressors including loss of a long term girlfriend, unemployment and homelessness.  Pt reports 2 quarts of beer per day.  He reports he has been drinking daily for approx 2-3 months, but has a hx of alcoholism.  Pt reports he was in rehab in Dec 2015.  He reports no hx of detox seizures but he does have a seizure disorder.  Pt reports compliance with his HIV medications.  Pt reports he is not being treated for his depression at this time.  Pt reports sinus congestion for several days consistent with previous episodes of hay fever with mild, generalized throbbing headache. Pt also admits to dysuria, but denies fever, chills, neck pain, chest pain, shortness of breath, abdominal pain, nausea, vomiting, diarrhea, weakness, dizziness, syncope, hematuria, penile discharge, testicular pain.  Pt also reports generalized fatigue without weakness for several months.     Past Medical History  Diagnosis Date  . Hypertension   . HIV (human immunodeficiency virus infection)   . Seizures   . Immune deficiency disorder   . Hepatitis C   . Alcoholism    History reviewed. No pertinent past surgical history. History reviewed. No pertinent family history. History  Substance Use Topics  . Smoking status: Current Every Day Smoker -- 0.50 packs/day for 35  years    Types: Cigarettes    Start date: 10/28/2011  . Smokeless tobacco: Never Used     Comment: not ready yet  . Alcohol Use: Yes     Comment: 5 quarts beer per week    Review of Systems  Constitutional: Negative for fever, diaphoresis, appetite change, fatigue and unexpected weight change.  HENT: Negative for mouth sores.   Eyes: Negative for visual disturbance.  Respiratory: Negative for cough, chest tightness, shortness of breath and wheezing.   Cardiovascular: Negative for chest pain.  Gastrointestinal: Negative for nausea, vomiting, abdominal pain, diarrhea and constipation.  Endocrine: Negative for polydipsia, polyphagia and polyuria.  Genitourinary: Positive for dysuria. Negative for urgency, frequency and hematuria.  Musculoskeletal: Negative for back pain and neck stiffness.  Skin: Negative for rash.  Allergic/Immunologic: Negative for immunocompromised state.  Neurological: Negative for syncope, light-headedness and headaches.  Hematological: Does not bruise/bleed easily.  Psychiatric/Behavioral: Positive for suicidal ideas and dysphoric mood. Negative for sleep disturbance. The patient is not nervous/anxious.       Allergies  Review of patient's allergies indicates no known allergies.  Home Medications   Prior to Admission medications   Medication Sig Start Date End Date Taking? Authorizing Provider  ATRIPLA [phone] MG per tablet TAKE 1 TABLET BY MOUTH EVERY DAY 11/04/14  Yes Campbell Riches, MD  ibuprofen (ADVIL,MOTRIN) 200 MG tablet Take 400 mg by mouth every 6 (six) hours as needed for mild pain or moderate pain.   Yes Historical Provider, MD  levETIRAcetam (KEPPRA) 500 MG tablet Take 1 tablet (500 mg total) by mouth 2 (two) times daily. For seizures 11/27/14  Yes Julianne Rice, MD  lisinopril-hydrochlorothiazide (PRINZIDE,ZESTORETIC) 10-12.5 MG per tablet Take 1 tablet by mouth daily. For high blood pressure 09/03/14  Yes Encarnacion Slates, NP  sildenafil  (VIAGRA) 100 MG tablet Take 1 tablet (100 mg total) by mouth daily as needed for erectile dysfunction. 10/14/14  Yes Campbell Riches, MD   BP 132/78 mmHg  Pulse 96  Temp(Src) 98.9 F (37.2 C) (Oral)  Resp 18  SpO2 100% Physical Exam  Constitutional: He appears well-developed and well-nourished. No distress.  Awake, alert, nontoxic appearance  HENT:  Head: Normocephalic and atraumatic.  Mouth/Throat: Oropharynx is clear and moist. No oropharyngeal exudate.  Eyes: Conjunctivae are normal. No scleral icterus.  Neck: Normal range of motion. Neck supple.  Cardiovascular: Normal rate, regular rhythm and intact distal pulses.   Pulmonary/Chest: Effort normal and breath sounds normal. No respiratory distress. He has no wheezes.  Equal chest expansion  Abdominal: Soft. Bowel sounds are normal. He exhibits no mass. There is no tenderness. There is no rebound and no guarding.  Musculoskeletal: Normal range of motion. He exhibits no edema.  Neurological: He is alert.  Speech is clear and goal oriented Moves extremities without ataxia  Skin: Skin is warm and dry. He is not diaphoretic.  Psychiatric: He exhibits a depressed mood. He expresses suicidal ideation. He expresses no homicidal ideation. He expresses suicidal plans. He expresses no homicidal plans.  Nursing note and vitals reviewed.   ED Course  Procedures (including critical care time) Labs Review Labs Reviewed  ACETAMINOPHEN LEVEL - Abnormal; Notable for the following:    Acetaminophen (Tylenol), Serum <10.0 (*)    All other components within normal limits  CBC - Abnormal; Notable for the following:    Hemoglobin 12.0 (*)    HCT 36.2 (*)    All other components within normal limits  COMPREHENSIVE METABOLIC PANEL - Abnormal; Notable for the following:    Potassium 3.3 (*)    CO2 17 (*)    Calcium 8.2 (*)    AST 267 (*)    ALT 153 (*)    GFR calc non Af Amer 69 (*)    GFR calc Af Amer 80 (*)    All other components within  normal limits  ETHANOL - Abnormal; Notable for the following:    Alcohol, Ethyl (B) 41 (*)    All other components within normal limits  URINE RAPID DRUG SCREEN (HOSP PERFORMED) - Abnormal; Notable for the following:    Cocaine POSITIVE (*)    Tetrahydrocannabinol POSITIVE (*)    All other components within normal limits  SALICYLATE LEVEL  URINALYSIS, ROUTINE W REFLEX MICROSCOPIC    Imaging Review No results found.   EKG Interpretation None      MDM   Final diagnoses:  Suicidal ideation  Depression  ETOH abuse  Cocaine abuse with cocaine-induced mood disorder   Isaac [name] presents with h/o depression and alcoholism with c/o SI with a plan worsening tonight.  Pt has dysuria on ROS but no penile discharge.  He is HIV+ but reports compliance.  Will check labs, UA.  Pt will need evaluation for inpatient placement for his SI.  9:28 PM UA without evidence of UTI and abdomen soft and nontender.  Labs are reassuring. Alcohol level XLI. Patient reports no cocaine usage in the last 3 days however UDS is positive for cocaine and  marijuana. He is alert and oriented, nontoxic and nonseptic appearing. Have discussed patient with total psych who will evaluate.  Pt is medically cleared.  Mild hypokalemia, repleted in the department.    11:05 PM Pt evaluated and meets inpatient criteria.  He is to be transferred to [place].    Per Lynnda Child, Patient given bed 302-2 accepting Dr. Sabra Heck.    Isaac Soho Arihaan Bellucci, PA-C 12/07/14 [id number]  Quintella Reichert, MD 12/07/14 [phone]

## 2014-12-07 NOTE — BH Assessment (Addendum)
2128:  Consulted with Abigail Butts, PA-C about Patient.  She reports Patient uses alcohol is depressed and tearful.  She reports Patient has experienced intermediate SI with a plan to jump off a bridge constantly for 24 hours.  2130:  Scheduled tele-assessment with Nurse Phineas Real.  2157:  Complete tele-assessment  2005:  Consult with Othella Boyer, Extender:  Per Extender Patient meets inpatient criteria and placement at [facility].  2042:  Per Lynnda Child, Patient given bed 302-2 accepting Dr. Sabra Heck.  2044:  Provided PA-C [place] with Patient's disposition.  2048:  Nurse Jorene Guest informed of Patient's acceptance to [facility] and provided room and bed number, accepting physician, and report number.

## 2014-12-07 NOTE — ED Notes (Signed)
Staffing office made aware of the need for sitter.

## 2014-12-08 ENCOUNTER — Inpatient Hospital Stay (HOSPITAL_COMMUNITY)
Admission: AD | Admit: 2014-12-08 | Discharge: 2014-12-16 | DRG: 885 | Disposition: A | Discharge status: Rehabilitation Facility | Payer: Federal, State, Local not specified - Other | Source: Intra-hospital | Attending: Psychiatry | Admitting: Psychiatry

## 2014-12-08 ENCOUNTER — Encounter (HOSPITAL_COMMUNITY): Payer: Self-pay | Admitting: *Deleted

## 2014-12-08 DIAGNOSIS — R45851 Suicidal ideations: Secondary | ICD-10-CM | POA: Diagnosis present

## 2014-12-08 DIAGNOSIS — G4700 Insomnia, unspecified: Secondary | ICD-10-CM | POA: Diagnosis present

## 2014-12-08 DIAGNOSIS — F1020 Alcohol dependence, uncomplicated: Secondary | ICD-10-CM | POA: Diagnosis present

## 2014-12-08 DIAGNOSIS — B20 Human immunodeficiency virus [HIV] disease: Secondary | ICD-10-CM | POA: Diagnosis present

## 2014-12-08 DIAGNOSIS — F17210 Nicotine dependence, cigarettes, uncomplicated: Secondary | ICD-10-CM | POA: Diagnosis present

## 2014-12-08 DIAGNOSIS — Z590 Homelessness: Secondary | ICD-10-CM

## 2014-12-08 DIAGNOSIS — R51 Headache: Secondary | ICD-10-CM | POA: Diagnosis present

## 2014-12-08 DIAGNOSIS — B1920 Unspecified viral hepatitis C without hepatic coma: Secondary | ICD-10-CM | POA: Diagnosis present

## 2014-12-08 DIAGNOSIS — F419 Anxiety disorder, unspecified: Secondary | ICD-10-CM | POA: Diagnosis present

## 2014-12-08 DIAGNOSIS — I10 Essential (primary) hypertension: Secondary | ICD-10-CM | POA: Diagnosis present

## 2014-12-08 DIAGNOSIS — F1014 Alcohol abuse with alcohol-induced mood disorder: Secondary | ICD-10-CM | POA: Diagnosis present

## 2014-12-08 DIAGNOSIS — Y902 Blood alcohol level of 40-59 mg/100 ml: Secondary | ICD-10-CM | POA: Diagnosis present

## 2014-12-08 DIAGNOSIS — R569 Unspecified convulsions: Secondary | ICD-10-CM | POA: Diagnosis present

## 2014-12-08 DIAGNOSIS — F332 Major depressive disorder, recurrent severe without psychotic features: Principal | ICD-10-CM | POA: Diagnosis present

## 2014-12-08 MED ORDER — EFAVIRENZ-EMTRICITAB-TENOFOVIR 600-200-300 MG PO TABS
1.0000 | ORAL_TABLET | Freq: Every day | ORAL | Status: DC
  Administered 2014-12-08 – 2014-12-16 (×9): 1 via ORAL
  Filled 2014-12-08 (×11): qty 1

## 2014-12-08 MED ORDER — LOPERAMIDE HCL 2 MG PO CAPS: 2.0000 mg | ORAL_CAPSULE | ORAL | Status: AC | PRN

## 2014-12-08 MED ORDER — POTASSIUM CHLORIDE CRYS ER 20 MEQ PO TBCR
20.0000 meq | EXTENDED_RELEASE_TABLET | Freq: Two times a day (BID) | ORAL | Status: AC
  Administered 2014-12-08 – 2014-12-10 (×4): 20 meq via ORAL
  Filled 2014-12-08 (×6): qty 1

## 2014-12-08 MED ORDER — LORAZEPAM 1 MG PO TABS
1.0000 mg | ORAL_TABLET | Freq: Two times a day (BID) | ORAL | Status: AC
  Administered 2014-12-10 (×2): 1 mg via ORAL
  Filled 2014-12-08 (×2): qty 1

## 2014-12-08 MED ORDER — LISINOPRIL-HYDROCHLOROTHIAZIDE 10-12.5 MG PO TABS: 1.0000 | ORAL_TABLET | Freq: Every day | ORAL | Status: DC

## 2014-12-08 MED ORDER — HYDROXYZINE HCL 25 MG PO TABS
25.0000 mg | ORAL_TABLET | Freq: Three times a day (TID) | ORAL | Status: DC | PRN
  Administered 2014-12-08: 25 mg via ORAL
  Filled 2014-12-08: qty 30
  Filled 2014-12-08: qty 1

## 2014-12-08 MED ORDER — METHOCARBAMOL 500 MG PO TABS
500.0000 mg | ORAL_TABLET | Freq: Three times a day (TID) | ORAL | Status: AC | PRN
  Administered 2014-12-08 – 2014-12-12 (×4): 500 mg via ORAL
  Filled 2014-12-08 (×4): qty 1

## 2014-12-08 MED ORDER — HYDROCHLOROTHIAZIDE 12.5 MG PO CAPS
12.5000 mg | ORAL_CAPSULE | Freq: Every day | ORAL | Status: DC
  Administered 2014-12-08 – 2014-12-16 (×9): 12.5 mg via ORAL
  Filled 2014-12-08 (×11): qty 1

## 2014-12-08 MED ORDER — LORAZEPAM 1 MG PO TABS
1.0000 mg | ORAL_TABLET | Freq: Three times a day (TID) | ORAL | Status: AC
Start: 2014-12-09 — End: 2014-12-09
  Administered 2014-12-09 (×3): 1 mg via ORAL
  Filled 2014-12-08 (×3): qty 1

## 2014-12-08 MED ORDER — IBUPROFEN 400 MG PO TABS
400.0000 mg | ORAL_TABLET | Freq: Four times a day (QID) | ORAL | Status: DC | PRN
  Administered 2014-12-08: 400 mg via ORAL
  Filled 2014-12-08: qty 1

## 2014-12-08 MED ORDER — DICYCLOMINE HCL 20 MG PO TABS: 20.0000 mg | ORAL_TABLET | Freq: Four times a day (QID) | ORAL | Status: AC | PRN

## 2014-12-08 MED ORDER — MAGNESIUM HYDROXIDE 400 MG/5ML PO SUSP: 30.0000 mL | Freq: Every day | ORAL | Status: DC | PRN

## 2014-12-08 MED ORDER — SILDENAFIL CITRATE 100 MG PO TABS: 100.0000 mg | ORAL_TABLET | Freq: Every day | ORAL | Status: DC | PRN

## 2014-12-08 MED ORDER — LISINOPRIL 10 MG PO TABS
10.0000 mg | ORAL_TABLET | Freq: Every day | ORAL | Status: DC
  Administered 2014-12-08 – 2014-12-16 (×9): 10 mg via ORAL
  Filled 2014-12-08 (×4): qty 1
  Filled 2014-12-08: qty 14
  Filled 2014-12-08 (×3): qty 1
  Filled 2014-12-08: qty 2
  Filled 2014-12-08 (×3): qty 1

## 2014-12-08 MED ORDER — LEVETIRACETAM 500 MG PO TABS
500.0000 mg | ORAL_TABLET | Freq: Two times a day (BID) | ORAL | Status: DC
  Administered 2014-12-08 – 2014-12-16 (×17): 500 mg via ORAL
  Filled 2014-12-08 (×4): qty 1
  Filled 2014-12-08: qty 28
  Filled 2014-12-08 (×2): qty 1
  Filled 2014-12-08: qty 28
  Filled 2014-12-08 (×13): qty 1

## 2014-12-08 MED ORDER — ACETAMINOPHEN 325 MG PO TABS
650.0000 mg | ORAL_TABLET | Freq: Four times a day (QID) | ORAL | Status: DC | PRN
  Administered 2014-12-14: 650 mg via ORAL
  Filled 2014-12-08: qty 2

## 2014-12-08 MED ORDER — LORAZEPAM 1 MG PO TABS
1.0000 mg | ORAL_TABLET | Freq: Four times a day (QID) | ORAL | Status: AC
  Administered 2014-12-08 (×3): 1 mg via ORAL
  Filled 2014-12-08 (×3): qty 1

## 2014-12-08 MED ORDER — LORAZEPAM 1 MG PO TABS
1.0000 mg | ORAL_TABLET | Freq: Every day | ORAL | Status: AC
  Administered 2014-12-11: 1 mg via ORAL
  Filled 2014-12-08: qty 1

## 2014-12-08 MED ORDER — ALUM & MAG HYDROXIDE-SIMETH 200-200-20 MG/5ML PO SUSP: 30.0000 mL | ORAL | Status: DC | PRN

## 2014-12-08 MED ORDER — ONDANSETRON 4 MG PO TBDP: 4.0000 mg | ORAL_TABLET | Freq: Four times a day (QID) | ORAL | Status: AC | PRN

## 2014-12-08 MED ORDER — NAPROXEN 500 MG PO TABS
500.0000 mg | ORAL_TABLET | Freq: Two times a day (BID) | ORAL | Status: AC | PRN
  Administered 2014-12-12: 500 mg via ORAL
  Filled 2014-12-08: qty 1

## 2014-12-08 MED ORDER — TRAZODONE HCL 50 MG PO TABS
50.0000 mg | ORAL_TABLET | Freq: Every evening | ORAL | Status: DC | PRN
  Administered 2014-12-08 – 2014-12-09 (×2): 50 mg via ORAL
  Filled 2014-12-08 (×2): qty 1
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
## 2014-12-08 NOTE — Progress Notes (Signed)
Psychoeducational Group Note  Date:  12/08/2014 Time:  1015  Group Topic/Focus:  Making Healthy Choices:   The focus of this group is to help patients identify negative/unhealthy choices they were using prior to admission and identify positive/healthier coping strategies to replace them upon discharge.  Participation Level:  Active  Participation Quality:  Appropriate  Affect:  Appropriate  Cognitive:  Oriented  Insight:  Improving  Engagement in Group:  Engaged  Additional Comments:  Attentive and participating in the group  Bryson Dames A 12/08/2014
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
## 2014-12-08 NOTE — Tx Team (Addendum)
Initial Interdisciplinary Treatment Plan   PATIENT STRESSORS: Financial difficulties Health problems Marital or family conflict Substance abuse   PATIENT STRENGTHS: Average or above average intelligence Motivation for treatment/growth   PROBLEM LIST: Problem List/Patient Goals Date to be addressed Date deferred Reason deferred Estimated date of resolution  Depression 12/08/14     Suicidal Ideation 12/08/14     Substance abuse 12/08/14     (Pt unable to list goal, feeling very ill)            "I need help with finding a place to go"                         DISCHARGE CRITERIA:  Adequate post-discharge living arrangements Improved stabilization in mood, thinking, and/or behavior Medical problems require only outpatient monitoring Motivation to continue treatment in a less acute level of care Need for constant or close observation no longer present Withdrawal symptoms are absent or subacute and managed without 24-hour nursing intervention  PRELIMINARY DISCHARGE PLAN: Attend 12-step recovery group Outpatient therapy Placement in alternative living arrangements  PATIENT/FAMIILY INVOLVEMENT: This treatment plan has been presented to and reviewed with the patient, Isaac [name].  The patient and family have been given the opportunity to ask questions and make suggestions.  Margaretann Loveless 12/08/2014, 1:21 AM

## 2014-12-08 NOTE — Progress Notes (Signed)
Pt reports he is not feeling well this evening.  He did not attend the evening AA group.  He stayed in his room and slept.  He is having mild to moderate withdrawal symptoms.  He rates his depression/anxiety high. He denies SI/HI/AV at this time.  He would like to go to a long term treatment program or an [place] after detox.  He makes his needs known to staff.  He was encouraged to be out of his room and participate more when his withdrawal symptoms decrease.  Support and encouragement offered.  Safety maintained with q15 minute checks.

## 2014-12-08 NOTE — B[facility] Group Notes (Signed)
[facility] Group Notes: (Clinical Social Work)   12/08/2014      Type of Therapy:  Group Therapy   Participation Level:  Did Not Attend despite MHT prompting   Selmer Dominion, LCSW 12/08/2014, 3:18 PM
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
## 2014-12-08 NOTE — Progress Notes (Signed)
Admission note:  Pt is 54 year old AAM admitted to the services of Dr. Sabra Heck after over a month of suicidal thoughts without attempt.  Pt states he had been drinking two quarts of beer daily as well as using cocaine and marijuana.  Pt has a seizure disorder and is on medication for this, last seizure was in January of this year.  Pt is HIV+ as well as Hep C positive.  Main complaint upon admission is fatigue and feeling ill from sinus symptoms.  Pt was cooperative with admission process but due to feeling ill was a poor historian.  Pt is presently homeless and unemployed.  Pt could not state any support systems nor any deterrents to suicide.  Pt reports no prior attempts but does state he has suicidal thoughts often.

## 2014-12-08 NOTE — ED Notes (Signed)
Patient left at this time via [facility] transport.

## 2014-12-08 NOTE — Progress Notes (Signed)
Patient did not attend the evening speaker [facility] meeting. Pt had fallen asleep in the dayroom and was woke up and notified that group was beginning.  Pt returned to his room and fell back to sleep.

## 2014-12-08 NOTE — [facility] Group Notes (Signed)
[facility] Group Notes:  Healthy support system  Date:  12/08/2014  Time:  10:09 AM  Type of Therapy:  Nurse Education  Participation Level:  Did Not Attend  Participation Quality:  Inattentive  Affect:  Flat  Cognitive:  Lacking  Insight:  None  Engagement in Group:  Lacking  Modes of Intervention:  Discussion  Summary of Progress/Problems:pt did not attend  Marcello Moores [facility] 12/08/2014, 10:09 AM

## 2014-12-09 LAB — HEPATITIS PANEL, ACUTE
HCV Ab: REACTIVE — AB
HEP B S AG: NEGATIVE
Hep A IgM: NONREACTIVE
Hep B C IgM: NONREACTIVE

## 2014-12-09 MED ORDER — SERTRALINE HCL 50 MG PO TABS
50.0000 mg | ORAL_TABLET | Freq: Every day | ORAL | Status: DC
  Administered 2014-12-09 – 2014-12-13 (×5): 50 mg via ORAL
  Filled 2014-12-09 (×7): qty 1
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
## 2014-12-09 NOTE — Progress Notes (Signed)
D: Pt is alert and oriented. Pt's mood and affect is depressed and flat. Pt denies HI and AVH. Pt reports passive suicidal thoughts stating "I just don't want to live anymore." Pt rates his depression 8/10 today. Pt complains of muscle spasms this morning with relief from PRN medication. Pt remains in bed isolative the majority of the day. Pt rates depression and hopelessness 8/10, and anxiety 7/10. Pt states his goal for the day is "feel better about my life." A: Active listening by RN. Encouragement/Support provided to pt. Medication education reviewed with pt. Pt encourage to get out of bed for dinner tonight. PRN medication administered for muscle spasms per providers orders (See MAR). Scheduled medications administered per providers orders (See MAR). 15 minute checks continued per protocol for patient safety.  R: Pt verbally contracts for safety, agrees not to harm self and agrees to come to staff with increased intensity of suicidal thoughts. Patient cooperative and receptive to nursing interventions. Pt remains safe.

## 2014-12-09 NOTE — BHH Group Notes (Signed)
[place] LCSW Group Therapy 12/09/2014  1:15 PM   Type of Therapy: Group Therapy  Participation Level: Did Not Attend. Patient invited to attend but declined.   [name], MSW, [place] Worker [facility] [phone]

## 2014-12-09 NOTE — Progress Notes (Signed)
Recreation Therapy Notes  Date: 04.04.2016 Time: 9:30am Location: [address] Group Room   Group Topic: Stress Management  Goal Area(s) Addresses:  Patient will actively participate in stress management techniques presented during session.   Behavioral Response: Did not attend.   Laureen Ochs Hatsue Sime, LRT/CTRS  Deboraha Goar L 12/09/2014 3:06 PM

## 2014-12-09 NOTE — Plan of Care (Signed)
Problem: Ineffective individual coping Goal: STG: Patient will remain free from self harm Outcome: Progressing Patient remains free from self harm. 15 minute checks continued per protocol for patient safety.   Problem: Alteration in mood Goal: LTG-Patient reports reduction in suicidal thoughts (Patient reports reduction in suicidal thoughts and is able to verbalize a safety plan for whenever patient is feeling suicidal)  Outcome: Not Progressing Patient endorses having suicidal thoughts today. Pt is able to contract for safety. Goal: LTG-Pt's behavior demonstrates decreased signs of depression 4/4: Goal not met: Pt presents with flat affect and depressed mood. Pt admitted with depression rating of 10. Pt to show decreased sign of depression and a rating of 3 or less before d/c.   [name], MSW, [place] Worker [facility] [phone]     (Patient's behavior demonstrates decreased signs of depression to the point the patient is safe to return home and continue treatment in an outpatient setting)  Outcome: Not Progressing Patient endorses depressive mood 8/10 and remains isolative in bed the majority of the day.  Problem: Diagnosis: Increased Risk For Suicide Attempt Goal: STG-Patient Will Comply With Medication Regime Outcome: Progressing Patient has adhered to medication regimen today with ease.

## 2014-12-09 NOTE — BHH Group Notes (Signed)
[facility] LCSW Aftercare Discharge Planning Group Note  12/09/2014  8:45 AM  Participation Quality: Did Not Attend. Patient invited to participate but declined.  [name], MSW, [place] Worker [facility] [phone]

## 2014-12-09 NOTE — Progress Notes (Signed)
Patient did attend thiry minutes of the evening speaker [place] meeting. Pt entered after it already started and left before it was over. Pt returned to his room and took a shower.

## 2014-12-10 LAB — HCV RNA QUANT
HCV Quantitative Log: 5.29 {Log} — ABNORMAL HIGH (ref <1.18)
HCV Quantitative: 194087 IU/mL — ABNORMAL HIGH (ref <15)

## 2014-12-10 MED ORDER — TRAZODONE HCL 100 MG PO TABS
100.0000 mg | ORAL_TABLET | Freq: Every evening | ORAL | Status: DC | PRN
  Filled 2014-12-10: qty 14

## 2014-12-10 NOTE — Progress Notes (Signed)
Recreation Therapy Notes  Animal-Assisted Activity (AAA) Program Checklist/Progress Notes Patient Eligibility Criteria Checklist & Daily Group note for Rec Tx Intervention  Date: 04.05.2016 Time: 2:45pm Location: [address]   AAA/T Program Assumption of Risk Form signed by Patient/ or Parent Legal Guardian yes  Patient is free of allergies or sever asthma yes  Patient reports no fear of animals yes  Patient reports no history of cruelty to animals yes  Patient understands his/her participation is voluntary yes  Behavioral Response: Did not attend.   Laureen Ochs Kees Idrovo, LRT/CTRS  Nyzaiah Kai L 12/10/2014 5:14 PM

## 2014-12-10 NOTE — Clinical Social Work Note (Signed)
At patient's request, referrals initiated to [facility] and [place], MSW, [place] Worker [facility] [phone]

## 2014-12-10 NOTE — Clinical Social Work Note (Signed)
Patient provided with listing of [place] in [place] and [facility], adult education. CSW encouraged patient to contact some facilities this evening regarding bed availability and possible admission, patient agreeable.  [name], MSW, [place] Worker [facility] [phone]

## 2014-12-10 NOTE — Progress Notes (Signed)
Pt reports he is doing a little better this evening.  He is still having withdrawal symptoms.  He has passive SI, but contracts for safety.  He denies HI/AV.  Pt stays much to himself.  He has not attended many groups today.  Pt would like help getting a place to go after discharge.  Pt makes his needs known to staff.  Support and encouragement offered.  Safety maintained with q15 minute checks.

## 2014-12-10 NOTE — Clinical Social Work Note (Addendum)
Per Melissa at [facility], no bed availability expected this week.  CSW spoke with Clearnce Sorrel at [place] who is agreeable to meet with patient on Wednesday evening around 6 pm.   [name], MSW, [place] Worker [facility] [phone]

## 2014-12-10 NOTE — Progress Notes (Signed)
D: Patient is alert and oriented. Pt's mood and affect is depressed, sad, and blunted. Pt denies HI and AVH. Pt reports passive suicidal thoughts this morning. Pt observed to be unsteady on feet in hallway, pt reports some dizziness this morning, vitals WDL. Pt rates depression and anxiety 7/10 and hopelessness 8/10. Pt reports his goal for the day is to "get over this feeling of overwhelming depression." Pt is not attending unit groups. A: Fall precautions reviewed with pt, MD [place] made aware of pt's complaints. Encouragement/Support provided to pt. Active listening by RN. Pt encouraged to attend groups. Medication education reviewed with pt. Scheduled medications administered per providers orders (See MAR). 15 minute checks continued per protocol for patient safety.  R: Pt verbally contracts for safety, pt agrees not to harm self, and pt agrees to come to staff with increased intensity in suicidal thoughts. Patient cooperative and receptive to nursing interventions. Pt non adherent to unit group scheduled. Pt remains safe.

## 2014-12-10 NOTE — BHH Group Notes (Signed)
[place] LCSW Group Therapy 12/10/2014  1:15 PM   Type of Therapy: Group Therapy  Participation Level: Did Not Attend. Patient in bed, invited to participate but declined.   [name], MSW, [place] Worker [facility] [phone]

## 2014-12-10 NOTE — BHH Suicide Risk Assessment (Signed)
[place] INPATIENT:  Family/Significant Other Suicide Prevention Education  Suicide Prevention Education:  Patient Refusal for Family/Significant Other Suicide Prevention Education: The patient Isaac [name] has refused to provide written consent for family/significant other to be provided Family/Significant Other Suicide Prevention Education during admission and/or prior to discharge.  Physician notified. SPE reviewed with patient and brochure provided. Patient encouraged to return to hospital if having suicidal thoughts, patient verbalized his/her understanding and has no further questions at this time.   Ohm Dentler, Casimiro Needle 12/10/2014, 12:35 PM
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
## 2014-12-10 NOTE — BHH Group Notes (Signed)
[facility] Group Notes:  (Nursing/MHT/Case Management/Adjunct)  Date:  12/10/2014  Time:  0900am  Type of Therapy:  Nurse Education  Participation Level:  Did Not Attend  Participation Quality:  Did not attend  Affect:  Did not attend  Cognitive:  Did not attend  Insight:  None  Engagement in Group:  Did not attend  Modes of Intervention:  Discussion, Education and Support  Summary of Progress/Problems: Patient was invited to group but did not attend. Pt remained in bed resting.  Charlyne Quale A 12/10/2014, 10:36 AM

## 2014-12-10 NOTE — Plan of Care (Signed)
Problem: Ineffective individual coping Goal: STG: Patient will remain free from self harm Outcome: Progressing Patient remains free from self harm. 15 minute checks continued per protocol for patient safety.  Goal: STG: Patient will participate in after care plan 4/4: Goal not met: CSW assessing for appropriate referrals for pt and will have follow up secured prior to d/c.  [name], MSW, [facility] [phone]   Outcome: Progressing Patient has made phone calls today is regards to his aftercare plan and pt has consulted with Erasmo Downer, LCSW, as well as updated her on this process.  Problem: Alteration in mood Goal: LTG-Patient reports reduction in suicidal thoughts (Patient reports reduction in suicidal thoughts and is able to verbalize a safety plan for whenever patient is feeling suicidal)  Outcome: Not Progressing Patient continues to endorse having suicidal thoughts. Pt verbally contracts for safety today.  Problem: Diagnosis: Increased Risk For Suicide Attempt Goal: STG-Patient Will Comply With Medication Regime Outcome: Progressing Patient has adhered to medication regimen today with ease.

## 2014-12-10 NOTE — BHH Group Notes (Signed)
Adult Psychoeducational Group Note  Date:  12/10/2014 Time:  8:47 PM  Group Topic/Focus:  Wrap-Up Group:   The focus of this group is to help patients review their daily goal of treatment and discuss progress on daily workbooks.  Participation Level:  Did Not Attend  Participation Quality:  None  Affect:  None  Cognitive:  None  Insight: None  Engagement in Group:  None  Modes of Intervention:  Discussion  Additional Comments:  Isaac [name] did not attend group.  Isaac [name] A 12/10/2014, 8:47 PM

## 2014-12-10 NOTE — Tx Team (Signed)
Interdisciplinary Treatment Plan Update (Adult) Date: 12/10/2014   Time Reviewed: 9:30 AM  Progress in Treatment: Attending groups: No Participating in groups: No Taking medication as prescribed: Yes Tolerating medication: Yes Family/Significant other contact made: No, patient has declined for CSW to make collateral contact Patient understands diagnosis: Yes Discussing patient identified problems/goals with staff: Yes Medical problems stabilized or resolved: Yes Denies suicidal/homicidal ideation: Yes Issues/concerns per patient self-inventory: Yes Other:  New problem(s) identified: N/A  Discharge Plan or Barriers:   4/5: CSW continuing to assess. Patient requesting referrals to [facility] and [facility]. Homeless and lacks social supports.  Reason for Continuation of Hospitalization:  Depression Anxiety Medication Stabilization   Comments: N/A  Estimated length of stay: 3-5 days  For review of initial/current patient goals, please see plan of care. Patient is a 54 year old African American Male admitted for SI and cocaine and alcohol abuse. Patient lives in [place] alone. Patient will benefit from crisis stabilization, medication evaluation, group therapy, and psycho education in addition to case management for discharge planning. Patient and CSW reviewed pt's identified goals and treatment plan. Pt verbalized understanding and agreed to treatment plan.   Attendees: Patient:    Family:    Physician: Dr. Parke Poisson; Dr. Sabra Heck; Dr. Darleene Cleaver 12/10/2014 9:30 AM  Nursing: Lawernce Pitts, Darnelle Bos [place], [place] 12/10/2014 9:30 AM  Clinical Social Worker: [name],  [place] 12/10/2014 9:30 AM  Other: Joette Catching, LCSW 12/10/2014 9:30 AM  Other: Lucinda Dell, [place] Sessions Liaison 12/10/2014 9:30 AM  Other: Lars Pinks, Case Manager 12/10/2014 9:30 AM  Other: Ave Filter NP 12/10/2014 9:30 AM  Other: Maxie Better, LCSW 12/10/2014 9:30 AM  Other:    Other:    Other:     Other:      Scribe for Treatment Team:  [name], MSW, [employer] [phone]

## 2014-12-11 LAB — BASIC METABOLIC PANEL
Anion gap: 9 (ref 5–15)
BUN: 22 mg/dL (ref 6–23)
CO2: 24 mmol/L (ref 19–32)
Calcium: 8.9 mg/dL (ref 8.4–10.5)
Chloride: 103 mmol/L (ref 96–112)
Creatinine, Ser: 1.1 mg/dL (ref 0.50–1.35)
GFR calc Af Amer: 87 mL/min — ABNORMAL LOW (ref >90)
GFR calc non Af Amer: 75 mL/min — ABNORMAL LOW (ref >90)
Glucose, Bld: 96 mg/dL (ref 70–99)
Potassium: 3.7 mmol/L (ref 3.5–5.1)
SODIUM: 136 mmol/L (ref 135–145)
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
## 2014-12-11 NOTE — BHH Group Notes (Signed)
   [place] LCSW Aftercare Discharge Planning Group Note  12/11/2014  8:45 AM   Participation Quality: Alert, Appropriate and Oriented  Mood/Affect: Depressed and Flat  Depression Rating: 8  Anxiety Rating: 8  Thoughts of Suicide: Pt endorses passive SI but contracts for safety  Will you contract for safety? Yes  Current AVH: Pt denies  Plan for Discharge/Comments: Pt attended discharge planning group and actively participated in group. CSW provided pt with today's workbook. Patient reports experiencing high levels of depression and anxiety. Patient is homeless and has limited social supports. [facility] is reviewing patient's information for waiting list, [facility] referral pending, Clearnce Sorrel from [facility] has agreed to come speak with patient tonight at 6 pm.   Transportation Means: Pt denies access to transportation  Supports: No supports mentioned at this time  [name], MSW, [place] Worker [employer] [phone]

## 2014-12-11 NOTE — Progress Notes (Signed)
Patient ID: Isaac [name], male   DOB: [birth year], 54 y.o.   MRN: [id number] D: Patient alert and cooperative in the dayroom. Pt quite not interacting with peers. Pt denies detoxing from [facility] and reports his day was okay. Pt attended evening AA and rushed to bed after. Pt endorses passive SI without a plan and verbally contract to come to staff. Pt denies HI/AVH and pain. No acute distressed noted at this time.   A: Emotional support given and will continue to monitor pt's progress for stabilization.  R: Patient remains safe.

## 2014-12-11 NOTE — Progress Notes (Signed)
Recreation Therapy Notes  Date: 04.06.2016 Time: 9:30am Location: [address] Group Room   Group Topic: Stress Management  Goal Area(s) Addresses:  Patient will actively participate in stress management techniques presented during session.   Behavioral Response: Did not attend.   Laureen Ochs Arnez Stoneking, LRT/CTRS  Macoy Rodwell L 12/11/2014 2:16 PM

## 2014-12-11 NOTE — Progress Notes (Signed)
Pt has been in bed most of the evening.  He got up briefly to get a snack and have his vitals checked, but then went right back to bed.  Pt denies HI/AV, but does state that he still has passive suicidal thoughts that come and go.  He was given a list of [employer] to contact, and he also has a referral to [facility] and will meet with a rep tomorrow.  He states he has been in the bed a lot today as he has not felt well d/t withdrawal symptoms.  Pt was encouraged to make his needs known to staff.  Pt is pleasant and cooperative with staff.  Support and encouragement offered.  Safety maintained with q15 minute checks.

## 2014-12-11 NOTE — BHH Group Notes (Signed)
[place] LCSW Group Therapy 12/11/2014  1:15 PM   Type of Therapy: Group Therapy  Participation Level: Did Not Attend. Patient invited to participate but declined.   [name], MSW, [place] Worker [facility] [phone]

## 2014-12-11 NOTE — Progress Notes (Signed)
Isaac [name] has spent most of the day in the bed today. HE is quiet, flat and has poor eye contact.   A He completed  His daily  Assessment and on it he rated his depression, hopelessness and anxiety " 7/8/7" and he stated he has had SI today, but he contracted with this nurse to seek our help if he felt unsafe.   R he has taken all of his meds  And he visited with [facility] this evening , in hope that he may be dc'Isaac there. poc cont.

## 2014-12-12 NOTE — Clinical Social Work Note (Signed)
CSW left voicemail for Isaac [name] at [facility] regarding possible admission. Awaiting return call.  [name], MSW, [place] Worker [facility] [phone]

## 2014-12-12 NOTE — BHH Group Notes (Signed)
[place] LCSW Group Therapy 12/12/2014  1:15 PM   Type of Therapy: Group Therapy  Participation Level: Did Not Attend. Patient invited to participate but declined.   [name], MSW, [place] Worker [facility] [phone]

## 2014-12-12 NOTE — Progress Notes (Addendum)
Patient ID: Isaac [name], male   DOB: 09-May-1961, 54 y.o.   MRN: [id number] D: Patient states, "I don't feel good."  He came to med window and put his head down on his hands.  He reports his depression and hopelessness as a 10; anxiety as a 10.  Patient could not communicate exactly what was hurting.  He complained of a headache; pain scale 7.  Patient just wanted to take his meds and go lay back down.  He did not attend group this morning.  Patient reports passive SI; denies HI/AVH. A: Continue to monitor medication management and MD orders.  Safety checks every 15 minutes per protocol.  Meet 1:1 with patient to discuss needs and offer encouragement. R: Patient has had minimal interaction with staff and peers today.

## 2014-12-12 NOTE — BHH Group Notes (Signed)
[place] Group Notes:  (Nursing/MHT/Case Management/Adjunct)  Date:  12/12/2014  Time:  0900  Type of Therapy:  Nurse Education  Participation Level:  Did Not Attend  Participation Quality:    Affect:    Cognitive:    Insight:    Engagement in Group:    Modes of Intervention:    Summary of Progress/Problems:  Isaac [name], Isaac [name] 12/12/2014, 9:20 AM

## 2014-12-12 NOTE — Plan of Care (Signed)
Problem: Alteration in mood Goal: STG-Patient is able to discuss feelings and issues (Patient is able to discuss feelings and issues leading to depression)  Outcome: Progressing Met with pt 1:1 pt able to discuss feeling with Probation officer.

## 2014-12-12 NOTE — Progress Notes (Signed)
Pt attended and was engaged in [place]. Pt stated he really enjoyed it.

## 2014-12-12 NOTE — Clinical Social Work Note (Signed)
CSW met with patient who reports that he would like to go to [facility] program at discharge.  Per Lenna Sciara at [facility], bed may be available on Monday 4/11.  [name], MSW, [place] Worker [facility] [phone]
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
## 2014-12-12 NOTE — Clinical Social Work Note (Signed)
CSW left voicemail for [facility] Admissions Coordinator Merry Proud regarding referral. Awaiting return call.  [name], MSW, [place] Worker [facility] [phone]

## 2014-12-13 MED ORDER — QUETIAPINE FUMARATE 100 MG PO TABS
100.0000 mg | ORAL_TABLET | Freq: Every day | ORAL | Status: DC
  Administered 2014-12-13 – 2014-12-15 (×2): 100 mg via ORAL
  Filled 2014-12-13: qty 14
  Filled 2014-12-13 (×4): qty 1

## 2014-12-13 NOTE — Plan of Care (Signed)
Problem: Diagnosis: Increased Risk For Suicide Attempt Goal: STG-Patient Will Attend All Groups On The Unit Outcome: Progressing Pt reports he enjoyed evening karaoke group
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
## 2014-12-13 NOTE — Progress Notes (Signed)
Patient ID: Isaac [name], male   DOB: 03/26/1961, 54 y.o.   MRN: [id number] D: Patient alert and cooperative in the dayroom. Pt reports he really enjoyed karaoke this evening. Pt reports he wished he had participated. Pt c/o generalized body aches and requested medication.   A: medication administered as prescribed. Emotional support given and will continue to monitor pt's progress for stabilization.  R: Patient remains safe. Pt asleep upon reassessment.

## 2014-12-13 NOTE — Progress Notes (Signed)
Isaac [name] looks much better this morning , in that he comes to the med window and smiles at the nurse as he takes his morning meds. He says " I'm doing better!!!". He makes direct eye contact, takes his meds immediately and then walks into the dayroom.   A He attends his groups, says he is " feelign better" and he completes his daily assessment and onit hew rites he denies SI and he rates his depression, hopelessness and anxiety "5/5/5" respectively.   R POC cont.

## 2014-12-13 NOTE — BHH Group Notes (Signed)
   [facility] LCSW Aftercare Discharge Planning Group Note  12/13/2014  8:45 AM   Participation Quality: Alert, Appropriate and Oriented  Mood/Affect: Depressed and Flat  Depression Rating: 5  Anxiety Rating: 5  Thoughts of Suicide: Pt denies SI/HI  Will you contract for safety? Yes  Current AVH: Pt denies  Plan for Discharge/Comments: Pt attended discharge planning group and actively participated in group. CSW provided pt with today's workbook. Patient reports experiencing some improvement in his mood since admission but denied sleeping well last night. He plans on discharging to [facility] (if bed available) or [facility] Monday 4/11.  Transportation Means: Pt reports access to transportation- requesting bus pass  Supports: No supports mentioned at this time  [name], MSW, [place] Social Worker [employer] [phone]

## 2014-12-13 NOTE — Progress Notes (Signed)
Patient ID: REAL CONA, male   DOB: 17-May-1961, 54 y.o.   MRN: [id number] D: Patient alert and cooperative. Pt reports decrease in anxiety and depressive symptoms. Pt rated anxiety and depression as 2 on 0-10 scale. Pt observed watching TV and interacting with peers. Pt c/o insomnia. Pt denies SI/HI/AVH and pain.   A: Medication administered as prescribed. Pt reminded of PRN trazodone if unable to fall asleep. Emotional support given and will continue to monitor pt's progress for stabilization.  R: Patient remains safe.

## 2014-12-13 NOTE — BHH Group Notes (Signed)
[place] LCSW Group Therapy 12/13/2014  1:15 PM   Type of Therapy: Group Therapy  Participation Level: Did Not Participate. Patient presented late to group and did not engage in discussion before being called out shortly after to speak with the physician.   [name], MSW, [place] Worker [facility] [phone]

## 2014-12-13 NOTE — Plan of Care (Signed)
Problem: Alteration in mood Goal: STG-Patient reports thoughts of self-harm to staff Outcome: Progressing Pt is safe and denies thoughts of self harm

## 2014-12-13 NOTE — Tx Team (Addendum)
Interdisciplinary Treatment Plan Update (Adult) Date: 12/13/2014   Time Reviewed: 9:30 AM  Progress in Treatment: Attending groups: Minimally Participating in groups: Yes Taking medication as prescribed: Yes Tolerating medication: Yes Family/Significant other contact made: No, patient has declined for CSW to make collateral contact Patient understands diagnosis: Yes Discussing patient identified problems/goals with staff: Yes Medical problems stabilized or resolved: Yes Denies suicidal/homicidal ideation: Yes Issues/concerns per patient self-inventory: Yes Other:  New problem(s) identified: N/A  Discharge Plan or Barriers:   4/5: CSW continuing to assess. Patient requesting referrals to [facility] and [facility]. Homeless and lacks social supports.  4/8: Patient plans to discharge to [facility] or [facility] on Monday 4/11.  Reason for Continuation of Hospitalization:  Depression Anxiety Medication Stabilization   Comments: N/A  Estimated length of stay: Discharge anticipated for Monday 4/11.  For review of initial/current patient goals, please see plan of care. Patient is a 54 year old African American Male admitted for SI and cocaine and alcohol abuse. Patient lives in [place] alone. Patient will benefit from crisis stabilization, medication evaluation, group therapy, and psycho education in addition to case management for discharge planning. Patient and CSW reviewed pt's identified goals and treatment plan. Pt verbalized understanding and agreed to treatment plan.   Attendees: Patient:    Family:    Physician: Dr. Parke Poisson; Dr. Sabra Heck; Dr. Parke Poisson; Dr. Shea Evans  12/13/2014 9:30 AM  Nursing: Mollie Germany, Karna Christmas RN 12/13/2014 9:30 AM  Clinical Social Worker: Erasmo Downer Felisa Zechman,  [place] 12/13/2014 9:30 AM  Other: Joette Catching, LCSW 12/13/2014 9:30 AM  Other: Lucinda Dell, Beverly Sessions Liaison 12/13/2014 9:30 AM  Other: Lars Pinks, Case Manager  12/13/2014 9:30 AM  Other: Ave Filter NP 12/13/2014 9:30 AM  Other: Maxie Better, LCSW 12/13/2014 9:30 AM  Other:    Other:    Other:    Other:      Scribe for Treatment Team:  [name], MSW, [employer] [phone]

## 2014-12-13 NOTE — Progress Notes (Signed)
Recreation Therapy Notes  Date: 04.08.2016 Time: 9:30am Location: [address] Group Room   Group Topic: Stress Management  Goal Area(s) Addresses:  Patient will actively participate in stress management techniques presented during session.   Behavioral Response: Did not attend.   Laureen Ochs Danese Dorsainvil, LRT/CTRS  Kemontae Dunklee L 12/13/2014 5:59 PM

## 2014-12-13 NOTE — Progress Notes (Signed)
Pt did not attend group. 

## 2014-12-13 NOTE — Progress Notes (Signed)
Adult Psychoeducational Group Note  Date:  12/13/2014 Time:  8:00  Group Topic/Focus:  Wrap-Up Group:   The focus of this group is to help patients review their daily goal of treatment and discuss progress on daily workbooks.  Participation Level:  Active  Participation Quality:  Appropriate and Attentive  Affect:  Appropriate  Cognitive:  Alert and Appropriate  Insight: Appropriate  Engagement in Group:  Engaged  Modes of Intervention:  Discussion  Additional Comments:  Pt was attentive and appropriate during tonight AA group. Pt was able to share about being in treatment again and being grateful. Pt stated that he is ready for treatment and return to the halfway house.   Theodoro Grist D 12/13/2014, 9:47 PM

## 2014-12-13 NOTE — Clinical Social Work Note (Signed)
Patient informed that he needed to complete a telephone screening with Melissa at [facility] this afternoon.   [name], MSW, [place]nie Worker [facility] [phone]

## 2014-12-14 DIAGNOSIS — F332 Major depressive disorder, recurrent severe without psychotic features: Principal | ICD-10-CM

## 2014-12-14 MED ORDER — LORATADINE 10 MG PO TABS
10.0000 mg | ORAL_TABLET | Freq: Every day | ORAL | Status: DC
  Administered 2014-12-14 – 2014-12-16 (×3): 10 mg via ORAL
  Filled 2014-12-14 (×3): qty 1
  Filled 2014-12-14: qty 14
  Filled 2014-12-14 (×2): qty 1

## 2014-12-14 NOTE — Progress Notes (Signed)
Patient ID: Isaac [name], male   DOB: 07/24/1961, 54 y.o.   MRN: [id number] D   ---  Pt. Denies pain or dis-comfort this shift.   He does complain of sinus issues and was ordered medication for it.  He has maintaines a calm , pleasant affect and attends groups with good participation.  Pt. Interacts well with peers.  Pt. Agrees to contract for safety  --  A  --  Support, safety cks and meds as ordered  --- R -  Pt. Remains safe and pleasant on unit

## 2014-12-14 NOTE — Progress Notes (Signed)
D   Pt is appropriate and pleasant   He denies suicidal and homicidal ideation and reports improved mood and thinking since hospitalization  He is active on the milieu and in groups A   Verbal support given   Medications administered and effectiveness monitored   Q 15 min checks    R   Pt safe at present

## 2014-12-14 NOTE — Progress Notes (Signed)
[place] Group Notes:  (Nursing/MHT/Case Management/Adjunct)  Date:  12/14/2014  Time:  2100 Type of Therapy:  wrap up group  Participation Level:  Active  Participation Quality:  Appropriate, Attentive, Sharing and Supportive  Affect:  Appropriate  Cognitive:  Appropriate  Insight:  Improving  Engagement in Group:  Engaged  Modes of Intervention:  Clarification, Education and Support  Summary of Progress/Problems:Pt reports feeling ecstatic without having any mind altering substances in his body.  Pt shared that he has been running away from his potential and responsibility his whole life and he is ready to never use again.    Jacques Navy 12/14/2014, 11:55 PM
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
## 2014-12-14 NOTE — Progress Notes (Signed)
 .  Psychoeducational Group Note    Date: 12/14/2014 Time:  0930    Goal Setting Purpose of Group: To be able to set a goal that is measurable and that can be accomplished in one day Participation Level:  Active  Participation Quality:  Appropriate  Affect:  Appropriate  Cognitive:  Oriented  Insight:  Improving  Engagement in Group:  Engaged  Additional Comments:  Pt shared with the group his goal for the day.  Isaac [name]

## 2014-12-14 NOTE — Progress Notes (Signed)
Psychoeducational Group Note  Date: 12/14/2014 Time:  1015  Group Topic/Focus:  Identifying Needs:   The focus of this group is to help patients identify their personal needs that have been historically problematic and identify healthy behaviors to address their needs.  Participation Level:  Active  Participation Quality:  Appropriate  Affect:  Appropriate  Cognitive:  Oriented  Insight:  Improving  Engagement in Group:  Engaged  Additional Comments:  Pt attended this group and participated   Isaac [name]
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
## 2014-12-15 DIAGNOSIS — G4700 Insomnia, unspecified: Secondary | ICD-10-CM

## 2014-12-15 DIAGNOSIS — F1014 Alcohol abuse with alcohol-induced mood disorder: Secondary | ICD-10-CM

## 2014-12-15 NOTE — Progress Notes (Signed)
Patient did attend the evening speaker AA meeting.  

## 2014-12-15 NOTE — Plan of Care (Signed)
Problem: Ineffective individual coping Goal: STG-Increase in ability to manage activities of daily living Outcome: Progressing Patient up and dressed. Visible in milieu. Appropriate. Attending programming.  Problem: Diagnosis: Increased Risk For Suicide Attempt Goal: LTG-Patient Will Report Absence of Withdrawal Symptoms LTG (by discharge): Patient will report absence of withdrawal symptoms.  Outcome: Progressing Patient denies pain and physical problems.

## 2014-12-15 NOTE — BHH Group Notes (Signed)
[place] Group Notes:  (Nursing/MHT/Case Management/Adjunct)  Date:  12/15/2014  Time:  0930  Type of Therapy:  Nurse Education  Participation Level:  Active   Participation Quality:  Attentive  Affect:  Appropriate  Cognitive:  Appropriate  Insight:  Good  Engagement in Group:  Engaged  Modes of Intervention:  Discussion, Education, Problem-solving and Support  Summary of Progress/Problems: Patient actively participated in deep breathing exercise and discussion regarding support systems and things he's learned in his recovery. "God is my support and has seen me through everything. He has kept me alive and I will continue to rely on that power."  Jamie Kato 12/15/2014, 10:36 AM

## 2014-12-15 NOTE — BHH Group Notes (Signed)
[place] Group Notes:  (Nursing/MHT/Case Management/Adjunct)  Date:  12/15/2014  Time:  10:07 AM  Type of Therapy:  Psychoeducational Skills  Participation Level:  Did Not Attend  Participation Quality:  Did Not Attend  Affect:  Did Not Attend  Cognitive:  Did Not Attend  Insight:  None  Engagement in Group:  Did Not Attend  Modes of Intervention:  Did Not Attend  Summary of Progress/Problems: Pt did not attend patient self inventory group.   Benancio Deeds Shanta 12/15/2014, 10:07 AM

## 2014-12-15 NOTE — BHH Group Notes (Signed)
[place] Group Notes:  (Clinical Social Work)  12/15/2014   1:15-2:15PM  Summary of Progress/Problems:  The main focus of today's process group was to   identify the patient's current support system and decide on other supports that can be put in place.  The picture on workbook was used to discuss why additional supports are needed.  An emphasis was placed on using counselor, doctor, therapy groups, 12-step groups, and problem-specific support groups to expand supports.   There was also an extensive discussion about what constitutes a healthy support versus an unhealthy support.  The patient expressed full comprehension of the concepts presented.  One current healthy support is his ex-girlfriend, hospital peers, hospital staff, and his Higher Power.  CSW had him to explain how his Higher Power assists him positively.  He added that he himself is an unhealthy support, as he relies on being around a male, and cannot be by himself comfortably.  Also, the people with whom he used or uses crack are unhealthy.  He is considering volunteer work and/or a dog as means of increasing his healthy supports.  Type of Therapy:  Process Group  Participation Level:  Active  Participation Quality:  Attentive and Sharing  Affect:  Blunted  Cognitive:  Appropriate and Oriented  Insight:  Engaged  Engagement in Therapy:  Engaged  Modes of Intervention:  Education,  Support and [employer], LCSW 12/15/2014, 4:00pm
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
## 2014-12-15 NOTE — Progress Notes (Signed)
Patient in up, dressed and visible in the milieu. Affect bright and mood pleasant. States it is his birthday and he is "so glad to be sober and clear headed. It's such a relief to not be out on the streets right now." Patient praised for work he's done while here. Given support and encouragement. Medicated per orders without difficulty. Denies any pain or physical problems. No SI/HI and patient is safe at present socializing with peer in dayroom. Isaac [name]

## 2014-12-15 NOTE — Progress Notes (Signed)
Psychoeducational Group Note  Date: 12/15/2014 Time:0930 Group Topic/Focus:  Gratefulness:  The focus of this group is to help patients identify what two things they are most grateful for in their lives. What helps ground them and to center them on their work to their recovery.  Participation Level:  Active  Participation Quality:  Appropriate  Affect:  Appropriate  Cognitive:  Oriented  Insight:  Improving  Engagement in Group:  Engaged  Additional Comments:  Pt participated in the group and shared thoughts and feelings.  Isaac [name]

## 2014-12-15 NOTE — Progress Notes (Signed)
Psychoeducational Group Note  Psychoeducational Group Note  Date: 12/15/2014 Time:0930 Group Topic/Focus:  Gratefulness:  The focus of this group is to help patients identify what two things they are most grateful for in their lives. What helps ground them and to center them on their work to their recovery.  Participation Level:  Active  Participation Quality:  Appropriate  Affect:  Appropriate  Cognitive:  Oriented  Insight:  Improving  Engagement in Group:  Engaged  Additional Comments:  Pt participated in the group and shared thoughts and feelings.  Paulino Rily

## 2014-12-16 MED ORDER — EFAVIRENZ-EMTRICITAB-TENOFOVIR 600-200-300 MG PO TABS: 1.0000 | ORAL_TABLET | Freq: Every day | ORAL | Status: DC

## 2014-12-16 MED ORDER — LORATADINE 10 MG PO TABS: 10.0000 mg | ORAL_TABLET | Freq: Every day | ORAL | Status: DC

## 2014-12-16 MED ORDER — TRAZODONE HCL 100 MG PO TABS: 100.0000 mg | ORAL_TABLET | Freq: Every evening | ORAL | Status: DC | PRN

## 2014-12-16 MED ORDER — IBUPROFEN 200 MG PO TABS: 400.0000 mg | ORAL_TABLET | Freq: Four times a day (QID) | ORAL | Status: DC | PRN

## 2014-12-16 MED ORDER — LISINOPRIL-HYDROCHLOROTHIAZIDE 10-12.5 MG PO TABS: 1.0000 | ORAL_TABLET | Freq: Every day | ORAL | Status: DC

## 2014-12-16 MED ORDER — QUETIAPINE FUMARATE 100 MG PO TABS: 100.0000 mg | ORAL_TABLET | Freq: Every day | ORAL | Status: DC

## 2014-12-16 MED ORDER — HYDROXYZINE HCL 25 MG PO TABS: 25.0000 mg | ORAL_TABLET | Freq: Three times a day (TID) | ORAL | Status: DC | PRN
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
## 2014-12-16 NOTE — BHH Group Notes (Signed)
   [facility] LCSW Aftercare Discharge Planning Group Note  12/16/2014  8:45 AM   Participation Quality: Alert, Appropriate and Oriented  Mood/Affect: Depressed and Flat  Depression Rating: 5  Anxiety Rating: Reports experiencing high anxiety this morning regarding his discharge plans but unable to rate it  Thoughts of Suicide: Pt denies SI/HI  Will you contract for safety? Yes  Current AVH: Pt denies  Plan for Discharge/Comments: Pt attended discharge planning group and actively participated in group. CSW provided pt with today's workbook. Patient is hopeful to discharge to [facility] today.   Transportation Means: Pt reports access to transportation  Supports: No supports mentioned at this time  [name], MSW, [place] Social Worker [employer] [phone]

## 2014-12-16 NOTE — BHH Group Notes (Signed)
[facility] LCSW Group Therapy 12/16/2014  1:15 pm  Type of Therapy: Group Therapy Participation Level: Active  Participation Quality: Attentive, Sharing and Supportive  Affect: Appropriate  Cognitive: Alert and Oriented  Insight: Developing/Improving and Engaged  Engagement in Therapy: Developing/Improving and Engaged  Modes of Intervention: Clarification, Confrontation, Discussion, Education, Exploration,  Limit-setting, Orientation, Problem-solving, Rapport Building, Art therapist, Socialization and Support  Summary of Progress/Problems: Pt identified obstacles faced currently and processed barriers involved in overcoming these obstacles. Pt identified steps necessary for overcoming these obstacles and explored motivation (internal and external) for facing these difficulties head on. Pt further identified one area of concern in their lives and chose a goal to focus on for today. Patient identified his goal as working on his recovery and reports that he plans to get into AA/NA and complete program at [facility]. Patient expressed his appreciation for hospital staff assistance and support. CSW and other group members provided patient with emotional support and encouragement.  [name], MSW, [place] Worker [facility] [phone]

## 2014-12-16 NOTE — Progress Notes (Signed)
D: Pt denies SI/HI, denies hallucinations. Pt has been visible in milieu interacting with peers and staff appropriately.   A: Introduced self to pt. Supported and encouraged pt. Medications administered per order.   R: Pt is compliant with medications. Pt verbally contracts for safety. Will continue to monitor and assess.

## 2014-12-16 NOTE — Progress Notes (Signed)
  [facility] Adult Case Management Discharge Plan :  Will you be returning to the same living situation after discharge:  No, patient plans to discharge to [facility] At discharge, do you have transportation home?: Yes,  [facility] to provide transport Do you have the ability to pay for your medications: Yes,  patient will be provided with medication samples at discharge  Release of information consent forms completed and in the chart;  Patient's signature needed at discharge.  Patient to Follow up at: Follow-up Information    Follow up with [facility] On 12/16/2014.   Why:  Please present to [facility] today for residential treatment. Please call office if you need to reschedule appointment.   Contact information:   [address]  [place],  [address] [phone]      Patient denies SI/HI: Yes,  denies    Safety Planning and Suicide Prevention discussed: Yes,  with patient  Have you used any form of tobacco in the last 30 days? (Cigarettes, Smokeless Tobacco, Cigars, and/or Pipes): Yes  Has patient been referred to the Quitline?: Patient refused referral  Rebecka Oelkers, Casimiro Needle 12/16/2014, 9:51 AM

## 2014-12-16 NOTE — Progress Notes (Signed)
Pt was discharged  today.  He denied any S/I H/I or A/V hallucinations.  He was given f/u appointment, rx, sample medications, hotline info booklet, He voiced understanding to all instructions provided.  He declined the need for smoking cessation materials.

## 2014-12-18 ENCOUNTER — Telehealth: Payer: Self-pay | Admitting: *Deleted

## 2014-12-18 NOTE — Telephone Encounter (Signed)
Still unable to refer to [facility] clinic at this time. The patient has been discharged from [facility].  Notified patient's case English as a second language teacher.  Will continue to follow. Landis Gandy, RN

## 2014-12-20 NOTE — Progress Notes (Signed)
Patient Discharge Instructions:  After Visit Summary (AVS):   Faxed to:  12/20/14 Discharge Summary Note:   Faxed to:  12/20/14 Psychiatric Admission Assessment Note:   Faxed to:  12/20/14 Suicide Risk Assessment - Discharge Assessment:   Faxed to:  12/20/14 Faxed/Sent to the Next Level Care provider:  12/20/14  Faxed to [facility] [place], 12/20/2014, 12:34 PM

## 2015-01-20 ENCOUNTER — Telehealth: Payer: Self-pay | Admitting: Licensed Clinical Social Worker

## 2015-01-28 ENCOUNTER — Telehealth: Payer: Self-pay | Admitting: *Deleted

## 2015-01-28 NOTE — Telephone Encounter (Signed)
Patient contacted Robert Bellow for refill request of viagra. Robert Bellow came to triage for information, spoke with this RN. RN has not been able to get in touch with patient regarding neurology referral for the last 2 months (patient request to find source of seizures), advised that the patient needed to keep his scheduled appointment in July.  Patient upset, stating that the prescription was written for 1 year and he doesn't understand why there is a hold up in this refill.  RN advised patient that this refill would need to be run by Dr. Johnnye Sima for approval, as the patient has had a lot of other health concerns of high priority.  Patient stated he would call back in 2 days to see if Dr. Johnnye Sima replied (patient's phone is not working).  RN relayed the information to patient's case manager, Dalbert Batman, THP.   Landis Gandy, RN

## 2015-01-29 NOTE — Telephone Encounter (Signed)
Ok to refill 

## 2015-01-29 NOTE — Telephone Encounter (Signed)
Isaac [name] will send the refill request to patient assistance.  Thank you

## 2015-02-05 ENCOUNTER — Other Ambulatory Visit: Payer: Self-pay | Admitting: *Deleted

## 2015-02-05 DIAGNOSIS — R569 Unspecified convulsions: Secondary | ICD-10-CM

## 2015-02-05 MED ORDER — LEVETIRACETAM 500 MG PO TABS: 500.0000 mg | ORAL_TABLET | Freq: Two times a day (BID) | ORAL | Status: DC

## 2015-02-05 NOTE — Telephone Encounter (Signed)
Patient is in Medical Case Management services with [place]. Patient completed inpatient program at [facility] and was released 12/30/14. Patient is living in a home run by "[facility]" (recovery house). Patient is working full time and reports being compliant with HIV medications. Patient also reports no alcohol or illegal substances since being released from [facility]. Case Manager and patient have spoken about neurology referral which he reports is not a concern at this moment because he is compliant with his seizure medication.

## 2015-03-05 ENCOUNTER — Other Ambulatory Visit: Payer: Self-pay | Admitting: Infectious Diseases

## 2015-03-11 ENCOUNTER — Other Ambulatory Visit: Payer: Self-pay

## 2015-03-17 ENCOUNTER — Ambulatory Visit: Payer: Self-pay | Admitting: Infectious Diseases

## 2015-03-26 ENCOUNTER — Ambulatory Visit: Payer: Self-pay | Admitting: Infectious Diseases

## 2015-03-28 ENCOUNTER — Other Ambulatory Visit: Payer: Self-pay | Admitting: Infectious Disease

## 2015-04-05 ENCOUNTER — Other Ambulatory Visit: Payer: Self-pay | Admitting: Infectious Diseases

## 2015-04-11 DIAGNOSIS — B20 Human immunodeficiency virus [HIV] disease: Secondary | ICD-10-CM

## 2015-04-14 ENCOUNTER — Other Ambulatory Visit: Payer: Self-pay

## 2015-04-16 ENCOUNTER — Other Ambulatory Visit (INDEPENDENT_AMBULATORY_CARE_PROVIDER_SITE_OTHER): Payer: Self-pay

## 2015-04-16 DIAGNOSIS — B20 Human immunodeficiency virus [HIV] disease: Secondary | ICD-10-CM

## 2015-04-16 LAB — COMPLETE METABOLIC PANEL WITH GFR
ALT: 39 U/L (ref 9–46)
AST: 42 U/L — ABNORMAL HIGH (ref 10–35)
Albumin: 3.9 g/dL (ref 3.6–5.1)
Alkaline Phosphatase: 56 U/L (ref 40–115)
BILIRUBIN TOTAL: 0.3 mg/dL (ref 0.2–1.2)
BUN: 17 mg/dL (ref 7–25)
CALCIUM: 9.1 mg/dL (ref 8.6–10.3)
CHLORIDE: 106 mmol/L (ref 98–110)
CO2: 25 mmol/L (ref 20–31)
CREATININE: 0.99 mg/dL (ref 0.70–1.33)
GFR, EST NON AFRICAN AMERICAN: 86 mL/min (ref >=60)
GFR, Est African American: >89 mL/min (ref >=60)
Glucose, Bld: 82 mg/dL (ref 65–99)
Potassium: 4.7 mmol/L (ref 3.5–5.3)
Sodium: 139 mmol/L (ref 135–146)
Total Protein: 6.9 g/dL (ref 6.1–8.1)

## 2015-04-16 LAB — CBC
HCT: 37.3 % — ABNORMAL LOW (ref 39.0–52.0)
Hemoglobin: 12 g/dL — ABNORMAL LOW (ref 13.0–17.0)
MCH: 27.5 pg (ref 26.0–34.0)
MCHC: 32.2 g/dL (ref 30.0–36.0)
MCV: 85.6 fL (ref 78.0–100.0)
MPV: 7.9 fL — ABNORMAL LOW (ref 8.6–12.4)
Platelets: 307 10*3/uL (ref 150–400)
RBC: 4.36 MIL/uL (ref 4.22–5.81)
RDW: 13 % (ref 11.5–15.5)
WBC: 5 10*3/uL (ref 4.0–10.5)

## 2015-04-18 LAB — T-HELPER CELL (CD4) - (RCID CLINIC ONLY)
CD4 % Helper T Cell: 28 % — ABNORMAL LOW (ref 33–55)
CD4 T Cell Abs: 760 /uL (ref 400–2700)

## 2015-04-19 LAB — HIV-1 RNA QUANT-NO REFLEX-BLD
HIV 1 RNA Quant: <20 copies/mL (ref <20)
HIV-1 RNA Quant, Log: <1.3 {Log} (ref <1.30)

## 2015-04-28 ENCOUNTER — Ambulatory Visit: Payer: Self-pay | Admitting: Infectious Diseases

## 2015-06-11 ENCOUNTER — Ambulatory Visit: Payer: Self-pay | Admitting: Infectious Diseases

## 2015-07-05 ENCOUNTER — Other Ambulatory Visit: Payer: Self-pay | Admitting: Infectious Diseases

## 2015-07-05 DIAGNOSIS — R569 Unspecified convulsions: Secondary | ICD-10-CM

## 2015-07-13 ENCOUNTER — Emergency Department (INDEPENDENT_AMBULATORY_CARE_PROVIDER_SITE_OTHER)
Admission: EM | Admit: 2015-07-13 | Discharge: 2015-07-13 | Disposition: A | Discharge status: Home / Self Care | Payer: Self-pay | Source: Home / Self Care

## 2015-07-13 ENCOUNTER — Encounter (HOSPITAL_COMMUNITY): Payer: Self-pay | Admitting: Emergency Medicine

## 2015-07-13 DIAGNOSIS — Z711 Person with feared health complaint in whom no diagnosis is made: Secondary | ICD-10-CM

## 2015-07-13 NOTE — ED Notes (Signed)
The patient presented to the [facility] with a complaint of possibly contracting Hep B. The patient reported that he has Hep C and started having symptoms that he attributed to contracting Hep B.

## 2015-07-13 NOTE — Discharge Instructions (Signed)
If you developed worsening abdominal pain, vomiting, or jaundice( yellowing of your eyes) You should go to the ER for evaluation.  Otherwise, no alcohol and keep appointment with your ID doctor November 30.

## 2015-07-16 ENCOUNTER — Ambulatory Visit: Payer: Self-pay | Admitting: Infectious Diseases

## 2015-07-28 ENCOUNTER — Ambulatory Visit: Payer: Self-pay | Admitting: Infectious Diseases

## 2015-08-06 ENCOUNTER — Other Ambulatory Visit (INDEPENDENT_AMBULATORY_CARE_PROVIDER_SITE_OTHER): Payer: Self-pay

## 2015-08-06 DIAGNOSIS — B20 Human immunodeficiency virus [HIV] disease: Secondary | ICD-10-CM

## 2015-08-06 DIAGNOSIS — Z113 Encounter for screening for infections with a predominantly sexual mode of transmission: Secondary | ICD-10-CM

## 2015-08-06 DIAGNOSIS — Z79899 Other long term (current) drug therapy: Secondary | ICD-10-CM

## 2015-08-06 LAB — LIPID PANEL
CHOL/HDL RATIO: 6.1 ratio — AB (ref <=5.0)
Cholesterol: 200 mg/dL (ref 125–200)
HDL: 33 mg/dL — AB (ref >=40)
LDL CALC: 134 mg/dL — AB (ref <130)
TRIGLYCERIDES: 165 mg/dL — AB (ref <150)
VLDL: 33 mg/dL — ABNORMAL HIGH (ref <30)

## 2015-08-06 LAB — CBC WITH DIFFERENTIAL/PLATELET
BASOS PCT: 1 % (ref 0–1)
Basophils Absolute: 0 10*3/uL (ref 0.0–0.1)
EOS ABS: 0.1 10*3/uL (ref 0.0–0.7)
EOS PCT: 2 % (ref 0–5)
HCT: 39.8 % (ref 39.0–52.0)
Hemoglobin: 13 g/dL (ref 13.0–17.0)
Lymphocytes Relative: 51 % — ABNORMAL HIGH (ref 12–46)
Lymphs Abs: 2.1 10*3/uL (ref 0.7–4.0)
MCH: 27.8 pg (ref 26.0–34.0)
MCHC: 32.7 g/dL (ref 30.0–36.0)
MCV: 85 fL (ref 78.0–100.0)
MONO ABS: 0.4 10*3/uL (ref 0.1–1.0)
MONOS PCT: 10 % (ref 3–12)
MPV: 7.9 fL — ABNORMAL LOW (ref 8.6–12.4)
Neutro Abs: 1.5 10*3/uL — ABNORMAL LOW (ref 1.7–7.7)
Neutrophils Relative %: 36 % — ABNORMAL LOW (ref 43–77)
PLATELETS: 281 10*3/uL (ref 150–400)
RBC: 4.68 MIL/uL (ref 4.22–5.81)
RDW: 12.9 % (ref 11.5–15.5)
WBC: 4.1 10*3/uL (ref 4.0–10.5)

## 2015-08-06 LAB — COMPLETE METABOLIC PANEL WITH GFR
ALK PHOS: 59 U/L (ref 40–115)
ALT: 42 U/L (ref 9–46)
AST: 37 U/L — ABNORMAL HIGH (ref 10–35)
Albumin: 4.1 g/dL (ref 3.6–5.1)
BUN: 15 mg/dL (ref 7–25)
CO2: 29 mmol/L (ref 20–31)
Calcium: 9.1 mg/dL (ref 8.6–10.3)
Chloride: 100 mmol/L (ref 98–110)
Creat: 0.93 mg/dL (ref 0.70–1.33)
GLUCOSE: 72 mg/dL (ref 65–99)
POTASSIUM: 4.1 mmol/L (ref 3.5–5.3)
SODIUM: 137 mmol/L (ref 135–146)
Total Bilirubin: 0.4 mg/dL (ref 0.2–1.2)
Total Protein: 7.6 g/dL (ref 6.1–8.1)

## 2015-08-07 LAB — RPR

## 2015-08-07 LAB — T-HELPER CELL (CD4) - (RCID CLINIC ONLY)
CD4 T CELL ABS: 610 /uL (ref 400–2700)
CD4 T CELL HELPER: 27 % — AB (ref 33–55)

## 2015-08-07 LAB — URINE CYTOLOGY ANCILLARY ONLY
Chlamydia: NEGATIVE
NEISSERIA GONORRHEA: NEGATIVE

## 2015-08-11 LAB — HIV-1 RNA QUANT-NO REFLEX-BLD: HIV-1 RNA Quant, Log: <1.3 Log copies/mL (ref <1.30)

## 2015-08-20 ENCOUNTER — Other Ambulatory Visit: Payer: Self-pay | Admitting: *Deleted

## 2015-08-20 MED ORDER — SILDENAFIL CITRATE 100 MG PO TABS: 100.0000 mg | ORAL_TABLET | Freq: Every day | ORAL | Status: DC | PRN

## 2015-08-27 ENCOUNTER — Ambulatory Visit (INDEPENDENT_AMBULATORY_CARE_PROVIDER_SITE_OTHER): Payer: Self-pay | Admitting: Infectious Diseases

## 2015-08-27 ENCOUNTER — Encounter: Payer: Self-pay | Admitting: Infectious Diseases

## 2015-08-27 VITALS — BP 146/95 | HR 78 | Temp 98.4°F | Wt 176.0 lb

## 2015-08-27 DIAGNOSIS — Z9289 Personal history of other medical treatment: Secondary | ICD-10-CM

## 2015-08-27 DIAGNOSIS — Z09 Encounter for follow-up examination after completed treatment for conditions other than malignant neoplasm: Secondary | ICD-10-CM

## 2015-08-27 DIAGNOSIS — Z113 Encounter for screening for infections with a predominantly sexual mode of transmission: Secondary | ICD-10-CM

## 2015-08-27 DIAGNOSIS — R569 Unspecified convulsions: Secondary | ICD-10-CM

## 2015-08-27 DIAGNOSIS — B20 Human immunodeficiency virus [HIV] disease: Secondary | ICD-10-CM

## 2015-08-27 DIAGNOSIS — B182 Chronic viral hepatitis C: Secondary | ICD-10-CM

## 2015-08-27 DIAGNOSIS — Z79899 Other long term (current) drug therapy: Secondary | ICD-10-CM

## 2015-08-27 MED ORDER — DOLUTEGRAVIR SODIUM 50 MG PO TABS: 50.0000 mg | ORAL_TABLET | Freq: Every day | ORAL | Status: DC

## 2015-08-27 MED ORDER — ELBASVIR-GRAZOPREVIR 50-100 MG PO TABS: 1.0000 | ORAL_TABLET | Freq: Every day | ORAL | Status: DC

## 2015-08-27 MED ORDER — EMTRICITABINE-TENOFOVIR AF 200-25 MG PO TABS: 1.0000 | ORAL_TABLET | Freq: Every day | ORAL | Status: DC

## 2015-08-27 NOTE — Assessment & Plan Note (Signed)
He will f/u will pcp Have not been able to get him neuro f/u.

## 2015-08-27 NOTE — Assessment & Plan Note (Signed)
Staying in detox, doing well.

## 2015-08-27 NOTE — Assessment & Plan Note (Signed)
Will change his ART to DTGV/desc for hep C treatment.  Offered/refused condoms.  He has gotten flu shot.  Will see him back in 3 months,

## 2015-08-27 NOTE — Assessment & Plan Note (Signed)
Will start him on zepetier Spoke with pharm.
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
## 2015-09-12 ENCOUNTER — Other Ambulatory Visit: Payer: Self-pay

## 2015-09-16 ENCOUNTER — Other Ambulatory Visit (INDEPENDENT_AMBULATORY_CARE_PROVIDER_SITE_OTHER): Payer: Self-pay

## 2015-09-16 DIAGNOSIS — B20 Human immunodeficiency virus [HIV] disease: Secondary | ICD-10-CM

## 2015-09-16 DIAGNOSIS — B182 Chronic viral hepatitis C: Secondary | ICD-10-CM

## 2015-09-19 ENCOUNTER — Emergency Department (HOSPITAL_COMMUNITY): Payer: Self-pay

## 2015-09-19 ENCOUNTER — Inpatient Hospital Stay (HOSPITAL_COMMUNITY): Payer: Self-pay

## 2015-09-19 ENCOUNTER — Inpatient Hospital Stay (HOSPITAL_COMMUNITY)
Admission: EM | Admit: 2015-09-19 | Discharge: 2015-09-21 | DRG: 100 | Disposition: A | Discharge status: Home / Self Care | Payer: Self-pay | Attending: Pulmonary Disease | Admitting: Pulmonary Disease

## 2015-09-19 ENCOUNTER — Encounter (HOSPITAL_COMMUNITY): Payer: Self-pay | Admitting: Emergency Medicine

## 2015-09-19 ENCOUNTER — Other Ambulatory Visit: Payer: Self-pay

## 2015-09-19 DIAGNOSIS — J9601 Acute respiratory failure with hypoxia: Secondary | ICD-10-CM | POA: Diagnosis present

## 2015-09-19 DIAGNOSIS — I10 Essential (primary) hypertension: Secondary | ICD-10-CM | POA: Diagnosis present

## 2015-09-19 DIAGNOSIS — B20 Human immunodeficiency virus [HIV] disease: Secondary | ICD-10-CM | POA: Diagnosis present

## 2015-09-19 DIAGNOSIS — G40901 Epilepsy, unspecified, not intractable, with status epilepticus: Secondary | ICD-10-CM

## 2015-09-19 DIAGNOSIS — Z978 Presence of other specified devices: Secondary | ICD-10-CM

## 2015-09-19 DIAGNOSIS — E872 Acidosis: Secondary | ICD-10-CM | POA: Diagnosis present

## 2015-09-19 DIAGNOSIS — F1410 Cocaine abuse, uncomplicated: Secondary | ICD-10-CM | POA: Diagnosis present

## 2015-09-19 DIAGNOSIS — G40401 Other generalized epilepsy and epileptic syndromes, not intractable, with status epilepticus: Principal | ICD-10-CM | POA: Diagnosis present

## 2015-09-19 DIAGNOSIS — B1920 Unspecified viral hepatitis C without hepatic coma: Secondary | ICD-10-CM | POA: Diagnosis present

## 2015-09-19 DIAGNOSIS — Q282 Arteriovenous malformation of cerebral vessels: Secondary | ICD-10-CM

## 2015-09-19 DIAGNOSIS — B182 Chronic viral hepatitis C: Secondary | ICD-10-CM | POA: Diagnosis present

## 2015-09-19 DIAGNOSIS — J9600 Acute respiratory failure, unspecified whether with hypoxia or hypercapnia: Secondary | ICD-10-CM | POA: Diagnosis present

## 2015-09-19 DIAGNOSIS — R569 Unspecified convulsions: Secondary | ICD-10-CM

## 2015-09-19 HISTORY — DX: Unspecified viral hepatitis C without hepatic coma: B19.20

## 2015-09-19 HISTORY — DX: Epilepsy, unspecified, not intractable, without status epilepticus: G40.909

## 2015-09-19 HISTORY — DX: Arteriovenous malformation of cerebral vessels: Q28.2

## 2015-09-19 LAB — CBC WITH DIFFERENTIAL/PLATELET
BASOS ABS: 0 10*3/uL (ref 0.0–0.1)
Basophils Relative: 0 %
EOS ABS: 0 10*3/uL (ref 0.0–0.7)
EOS PCT: 0 %
HCT: 40.6 % (ref 39.0–52.0)
Hemoglobin: 13.4 g/dL (ref 13.0–17.0)
Lymphocytes Relative: 13 %
Lymphs Abs: 1.2 10*3/uL (ref 0.7–4.0)
MCH: 27.8 pg (ref 26.0–34.0)
MCHC: 33 g/dL (ref 30.0–36.0)
MCV: 84.2 fL (ref 78.0–100.0)
Monocytes Absolute: 0.7 10*3/uL (ref 0.1–1.0)
Monocytes Relative: 7 %
Neutro Abs: 7.5 10*3/uL (ref 1.7–7.7)
Neutrophils Relative %: 80 %
PLATELETS: 269 10*3/uL (ref 150–400)
RBC: 4.82 MIL/uL (ref 4.22–5.81)
RDW: 12 % (ref 11.5–15.5)
WBC: 9.4 10*3/uL (ref 4.0–10.5)

## 2015-09-19 LAB — POCT I-STAT 3, ART BLOOD GAS (G3+)
ACID-BASE DEFICIT: 10 mmol/L — AB (ref 0.0–2.0)
ACID-BASE DEFICIT: 2 mmol/L (ref 0.0–2.0)
BICARBONATE: 18.9 meq/L — AB (ref 20.0–24.0)
BICARBONATE: 23.5 meq/L (ref 20.0–24.0)
O2 SAT: 99 %
O2 SAT: 99 %
PH ART: 7.347 — AB (ref 7.350–7.450)
PO2 ART: 203 mmHg — AB (ref 80.0–100.0)
PO2 ART: 132 mmHg — AB (ref 80.0–100.0)
Patient temperature: 98.9
Patient temperature: 98.9
TCO2: 21 mmol/L (ref 0–100)
TCO2: 25 mmol/L (ref 0–100)
pCO2 arterial: 55.2 mmHg — ABNORMAL HIGH (ref 35.0–45.0)
pCO2 arterial: 42.9 mmHg (ref 35.0–45.0)
pH, Arterial: 7.144 — CL (ref 7.350–7.450)

## 2015-09-19 LAB — I-STAT CG4 LACTIC ACID, ED: Lactic Acid, Venous: 2.25 mmol/L (ref 0.5–2.0)

## 2015-09-19 LAB — GLUCOSE, CAPILLARY: Glucose-Capillary: 96 mg/dL (ref 65–99)

## 2015-09-19 LAB — I-STAT ARTERIAL BLOOD GAS, ED
ACID-BASE DEFICIT: 10 mmol/L — AB (ref 0.0–2.0)
Acid-base deficit: 2 mmol/L (ref 0.0–2.0)
BICARBONATE: 18.9 meq/L — AB (ref 20.0–24.0)
BICARBONATE: 23.5 meq/L (ref 20.0–24.0)
O2 SAT: 99 %
O2 SAT: 99 %
PO2 ART: 203 mmHg — AB (ref 80.0–100.0)
TCO2: 21 mmol/L (ref 0–100)
TCO2: 25 mmol/L (ref 0–100)
pCO2 arterial: 55.2 mmHg — ABNORMAL HIGH (ref 35.0–45.0)
pCO2 arterial: 42.9 mmHg (ref 35.0–45.0)
pH, Arterial: 7.144 — CL (ref 7.350–7.450)
pH, Arterial: 7.347 — ABNORMAL LOW (ref 7.350–7.450)
pO2, Arterial: 132 mmHg — ABNORMAL HIGH (ref 80.0–100.0)

## 2015-09-19 LAB — URINALYSIS, ROUTINE W REFLEX MICROSCOPIC
BILIRUBIN URINE: NEGATIVE
GLUCOSE, UA: 100 mg/dL — AB
KETONES UR: NEGATIVE mg/dL
LEUKOCYTES UA: NEGATIVE
Nitrite: NEGATIVE
PH: 5 (ref 5.0–8.0)
PROTEIN: 30 mg/dL — AB
Specific Gravity, Urine: 1.01 (ref 1.005–1.030)

## 2015-09-19 LAB — COMPREHENSIVE METABOLIC PANEL WITH GFR
ALT: 50 U/L (ref 17–63)
AST: 47 U/L — ABNORMAL HIGH (ref 15–41)
Albumin: 4 g/dL (ref 3.5–5.0)
Alkaline Phosphatase: 70 U/L (ref 38–126)
Anion gap: 12 (ref 5–15)
BUN: 9 mg/dL (ref 6–20)
CO2: 22 mmol/L (ref 22–32)
Calcium: 8.9 mg/dL (ref 8.9–10.3)
Chloride: 107 mmol/L (ref 101–111)
Creatinine, Ser: 1.05 mg/dL (ref 0.61–1.24)
Glucose, Bld: 156 mg/dL — ABNORMAL HIGH (ref 65–99)
Potassium: 3.6 mmol/L (ref 3.5–5.1)
Sodium: 141 mmol/L (ref 135–145)
Total Bilirubin: 0.5 mg/dL (ref 0.3–1.2)
Total Protein: 7.6 g/dL (ref 6.5–8.1)

## 2015-09-19 LAB — URINE MICROSCOPIC-ADD ON

## 2015-09-19 LAB — TSH: TSH: 1.555 u[IU]/mL (ref 0.350–4.500)

## 2015-09-19 LAB — CG4 I-STAT (LACTIC ACID): Lactic Acid, Venous: 2.25 mmol/L (ref 0.5–2.0)

## 2015-09-19 LAB — MAGNESIUM: MAGNESIUM: 2.1 mg/dL (ref 1.7–2.4)

## 2015-09-19 LAB — TROPONIN I
TROPONIN I: 0.06 ng/mL — AB (ref <0.031)
TROPONIN I: 0.06 ng/mL — AB (ref <0.031)
TROPONIN I: 0.04 ng/mL — AB (ref <0.031)

## 2015-09-19 LAB — RAPID URINE DRUG SCREEN, HOSP PERFORMED
AMPHETAMINES: NOT DETECTED
BARBITURATES: NOT DETECTED
Benzodiazepines: NOT DETECTED
COCAINE: NOT DETECTED
Opiates: NOT DETECTED
TETRAHYDROCANNABINOL: NOT DETECTED

## 2015-09-19 LAB — LACTIC ACID, PLASMA: Lactic Acid, Venous: 1.5 mmol/L (ref 0.5–2.0)

## 2015-09-19 LAB — ETHANOL: Alcohol, Ethyl (B): <5 mg/dL

## 2015-09-19 LAB — PHOSPHORUS: Phosphorus: 2.3 mg/dL — ABNORMAL LOW (ref 2.5–4.6)

## 2015-09-19 LAB — TRIGLYCERIDES: Triglycerides: 128 mg/dL (ref <150)

## 2015-09-19 LAB — MRSA PCR SCREENING: MRSA BY PCR: NEGATIVE

## 2015-09-19 MED ORDER — FENTANYL CITRATE (PF) 100 MCG/2ML IJ SOLN: 12.5000 ug | INTRAMUSCULAR | Status: DC | PRN

## 2015-09-19 MED ORDER — EMTRICITABINE-TENOFOVIR AF 200-25 MG PO TABS
1.0000 | ORAL_TABLET | Freq: Every day | ORAL | Status: DC
  Administered 2015-09-19 – 2015-09-21 (×3): 1 via ORAL
  Filled 2015-09-19 (×3): qty 1

## 2015-09-19 MED ORDER — FOLIC ACID 5 MG/ML IJ SOLN
1.0000 mg | Freq: Once | INTRAMUSCULAR | Status: AC
  Administered 2015-09-19: 1 mg via INTRAVENOUS
  Filled 2015-09-19: qty 0.2

## 2015-09-19 MED ORDER — MIDAZOLAM HCL 2 MG/2ML IJ SOLN
2.0000 mg | INTRAMUSCULAR | Status: DC | PRN
Start: 2015-09-19 — End: 2015-09-19

## 2015-09-19 MED ORDER — ROCURONIUM BROMIDE 50 MG/5ML IV SOLN
1.0000 mg/kg | Freq: Once | INTRAVENOUS | Status: DC
  Administered 2015-09-19: 03:00:00 via INTRAVENOUS

## 2015-09-19 MED ORDER — ENOXAPARIN SODIUM 40 MG/0.4ML ~~LOC~~ SOLN
40.0000 mg | SUBCUTANEOUS | Status: DC
  Administered 2015-09-19 – 2015-09-20 (×2): 40 mg via SUBCUTANEOUS
  Filled 2015-09-19 (×3): qty 0.4

## 2015-09-19 MED ORDER — DEXTROSE 5 % IV SOLN
500.0000 mg | Freq: Three times a day (TID) | INTRAVENOUS | Status: DC
  Administered 2015-09-19 – 2015-09-20 (×3): 500 mg via INTRAVENOUS
  Filled 2015-09-19 (×5): qty 5

## 2015-09-19 MED ORDER — THIAMINE HCL 100 MG/ML IJ SOLN
100.0000 mg | Freq: Once | INTRAMUSCULAR | Status: AC
  Administered 2015-09-19: 100 mg via INTRAVENOUS
  Filled 2015-09-19: qty 2

## 2015-09-19 MED ORDER — KETAMINE HCL 10 MG/ML IJ SOLN
50.0000 mg | Freq: Once | INTRAMUSCULAR | Status: DC
  Filled 2015-09-19: qty 5

## 2015-09-19 MED ORDER — SODIUM CHLORIDE 0.9 % IV SOLN
500.0000 mg | Freq: Two times a day (BID) | INTRAVENOUS | Status: DC
  Filled 2015-09-19 (×2): qty 5

## 2015-09-19 MED ORDER — FENTANYL CITRATE (PF) 100 MCG/2ML IJ SOLN
100.0000 ug | INTRAMUSCULAR | Status: DC | PRN
  Filled 2015-09-19: qty 2

## 2015-09-19 MED ORDER — SODIUM CHLORIDE 0.9 % IV BOLUS (SEPSIS)
1000.0000 mL | Freq: Once | INTRAVENOUS | Status: AC
  Administered 2015-09-19: 1000 mL via INTRAVENOUS

## 2015-09-19 MED ORDER — FENTANYL CITRATE (PF) 100 MCG/2ML IJ SOLN
100.0000 ug | INTRAMUSCULAR | Status: DC | PRN
  Administered 2015-09-19: 100 ug via INTRAVENOUS

## 2015-09-19 MED ORDER — BISACODYL 10 MG RE SUPP: 10.0000 mg | Freq: Every day | RECTAL | Status: DC | PRN

## 2015-09-19 MED ORDER — SODIUM CHLORIDE 0.9 % IV SOLN: 250.0000 mL | INTRAVENOUS | Status: DC | PRN

## 2015-09-19 MED ORDER — PROPOFOL 1000 MG/100ML IV EMUL
INTRAVENOUS | Status: AC
  Filled 2015-09-19: qty 100

## 2015-09-19 MED ORDER — VALPROATE SODIUM 500 MG/5ML IV SOLN
1500.0000 mg | INTRAVENOUS | Status: AC
  Administered 2015-09-19: 1500 mg via INTRAVENOUS
  Filled 2015-09-19: qty 15

## 2015-09-19 MED ORDER — PANTOPRAZOLE SODIUM 40 MG IV SOLR
40.0000 mg | Freq: Every day | INTRAVENOUS | Status: DC
  Administered 2015-09-19: 40 mg via INTRAVENOUS
  Filled 2015-09-19 (×2): qty 40

## 2015-09-19 MED ORDER — SODIUM CHLORIDE 0.9 % IV SOLN
1000.0000 mg | Freq: Once | INTRAVENOUS | Status: AC
  Administered 2015-09-19: 1000 mg via INTRAVENOUS
  Filled 2015-09-19: qty 10

## 2015-09-19 MED ORDER — ANTISEPTIC ORAL RINSE SOLUTION (CORINZ): 7.0000 mL | Freq: Four times a day (QID) | OROMUCOSAL | Status: DC

## 2015-09-19 MED ORDER — CHLORHEXIDINE GLUCONATE 0.12% ORAL RINSE (MEDLINE KIT)
15.0000 mL | Freq: Two times a day (BID) | OROMUCOSAL | Status: DC
  Administered 2015-09-19: 15 mL via OROMUCOSAL

## 2015-09-19 MED ORDER — LORAZEPAM 2 MG/ML IJ SOLN
INTRAMUSCULAR | Status: AC
  Administered 2015-09-19: 2 mg
  Filled 2015-09-19: qty 1

## 2015-09-19 MED ORDER — DOLUTEGRAVIR SODIUM 50 MG PO TABS
50.0000 mg | ORAL_TABLET | Freq: Every day | ORAL | Status: DC
  Administered 2015-09-19 – 2015-09-21 (×3): 50 mg via ORAL
  Filled 2015-09-19 (×3): qty 1

## 2015-09-19 MED ORDER — MIDAZOLAM HCL 2 MG/2ML IJ SOLN
2.0000 mg | INTRAMUSCULAR | Status: DC | PRN
  Administered 2015-09-19: 2 mg via INTRAVENOUS
  Filled 2015-09-19: qty 2

## 2015-09-19 MED ORDER — IOHEXOL 350 MG/ML SOLN
75.0000 mL | Freq: Once | INTRAVENOUS | Status: AC | PRN
  Administered 2015-09-19: 100 mL via INTRAVENOUS

## 2015-09-19 MED ORDER — PROPOFOL 1000 MG/100ML IV EMUL
5.0000 ug/kg/min | Freq: Once | INTRAVENOUS | Status: AC
  Administered 2015-09-19: 30 ug/kg/min via INTRAVENOUS

## 2015-09-19 MED ORDER — ETOMIDATE 2 MG/ML IV SOLN
0.3000 mg/kg | Freq: Once | INTRAVENOUS | Status: AC
  Administered 2015-09-19: 20 mg via INTRAVENOUS

## 2015-09-19 MED ORDER — ROCURONIUM BROMIDE 50 MG/5ML IV SOLN
100.0000 mg | Freq: Once | INTRAVENOUS | Status: AC
  Administered 2015-09-19: 100 mg via INTRAVENOUS

## 2015-09-19 MED ORDER — PROPOFOL 1000 MG/100ML IV EMUL
0.0000 ug/kg/min | INTRAVENOUS | Status: DC
  Administered 2015-09-19 (×2): 50 ug/kg/min via INTRAVENOUS
  Filled 2015-09-19: qty 100

## 2015-09-19 MED ORDER — PROPOFOL 1000 MG/100ML IV EMUL: 5.0000 ug/kg/min | Freq: Once | INTRAVENOUS | Status: DC

## 2015-09-19 NOTE — Progress Notes (Signed)
RT transported patient from ED to 11M without complications. RT will continue to monitor.

## 2015-09-19 NOTE — Consult Note (Signed)
Reason for Consult:cerebral AVM Referring Physician: TEACHING SERVICE  Isaac [name] is an 55 y.o. unknown.  HPI: patient brougth to the hospital after a seizure episode. He was intubated. Ct head showed an AVM  were called for advise. Seen by neurlogy  History reviewed. No pertinent past medical history.  History reviewed. No pertinent past surgical history.  No family history on file.  Social History:  has no tobacco, alcohol, and drug history on file.  Allergies: Allergies not on file  Medications: see hp  Results for orders placed or performed during the hospital encounter of 09/19/15 (from the past 48 hour(s))  Urine rapid drug screen (hosp performed)     Status: None   Collection Time: 09/19/15  3:33 AM  Result Value Ref Range   Opiates NONE DETECTED NONE DETECTED    Comment: QA FLAGS AND/OR RANGES MODIFIED BY DEMOGRAPHIC UPDATE ON 01/13 AT 0431 QA FLAGS AND/OR RANGES MODIFIED BY DEMOGRAPHIC UPDATE ON 01/13 AT 0459 QA FLAGS AND/OR RANGES MODIFIED BY DEMOGRAPHIC UPDATE ON 01/13 AT 0541 QA FLAGS AND/OR RANGES MODIFIED BY DEMOGRAPHIC UPDATE ON 01/13 AT 9892 QA FLAGS AND/OR RANGES MODIFIED BY DEMOGRAPHIC UPDATE ON 01/13 AT 1054    Cocaine NONE DETECTED NONE DETECTED    Comment: QA FLAGS AND/OR RANGES MODIFIED BY DEMOGRAPHIC UPDATE ON 01/13 AT 0431 QA FLAGS AND/OR RANGES MODIFIED BY DEMOGRAPHIC UPDATE ON 01/13 AT 0459 QA FLAGS AND/OR RANGES MODIFIED BY DEMOGRAPHIC UPDATE ON 01/13 AT 0541 QA FLAGS AND/OR RANGES MODIFIED BY DEMOGRAPHIC UPDATE ON 01/13 AT 1194 QA FLAGS AND/OR RANGES MODIFIED BY DEMOGRAPHIC UPDATE ON 01/13 AT 1054    Benzodiazepines NONE DETECTED NONE DETECTED    Comment: QA FLAGS AND/OR RANGES MODIFIED BY DEMOGRAPHIC UPDATE ON 01/13 AT 0431 QA FLAGS AND/OR RANGES MODIFIED BY DEMOGRAPHIC UPDATE ON 01/13 AT 0459 QA FLAGS AND/OR RANGES MODIFIED BY DEMOGRAPHIC UPDATE ON 01/13 AT 0541 QA FLAGS AND/OR RANGES MODIFIED BY DEMOGRAPHIC UPDATE ON 01/13 AT 1740 QA FLAGS  AND/OR RANGES MODIFIED BY DEMOGRAPHIC UPDATE ON 01/13 AT 1054    Amphetamines NONE DETECTED NONE DETECTED    Comment: QA FLAGS AND/OR RANGES MODIFIED BY DEMOGRAPHIC UPDATE ON 01/13 AT 0431 QA FLAGS AND/OR RANGES MODIFIED BY DEMOGRAPHIC UPDATE ON 01/13 AT 0459 QA FLAGS AND/OR RANGES MODIFIED BY DEMOGRAPHIC UPDATE ON 01/13 AT 0541 QA FLAGS AND/OR RANGES MODIFIED BY DEMOGRAPHIC UPDATE ON 01/13 AT 8144 QA FLAGS AND/OR RANGES MODIFIED BY DEMOGRAPHIC UPDATE ON 01/13 AT 1054    Tetrahydrocannabinol NONE DETECTED NONE DETECTED    Comment: QA FLAGS AND/OR RANGES MODIFIED BY DEMOGRAPHIC UPDATE ON 01/13 AT 0431 QA FLAGS AND/OR RANGES MODIFIED BY DEMOGRAPHIC UPDATE ON 01/13 AT 0459 QA FLAGS AND/OR RANGES MODIFIED BY DEMOGRAPHIC UPDATE ON 01/13 AT 0541 QA FLAGS AND/OR RANGES MODIFIED BY DEMOGRAPHIC UPDATE ON 01/13 AT 0806 QA FLAGS AND/OR RANGES MODIFIED BY DEMOGRAPHIC UPDATE ON 01/13 AT 1054    Barbiturates NONE DETECTED NONE DETECTED    Comment:        DRUG SCREEN FOR MEDICAL PURPOSES ONLY.  IF CONFIRMATION IS NEEDED FOR ANY PURPOSE, NOTIFY LAB WITHIN 5 DAYS.        LOWEST DETECTABLE LIMITS FOR URINE DRUG SCREEN Drug Class       Cutoff (ng/mL) Amphetamine      1000 Barbiturate      200 Benzodiazepine   818 Tricyclics       563 Opiates          300 Cocaine  300 THC              50 QA FLAGS AND/OR RANGES MODIFIED BY DEMOGRAPHIC UPDATE ON 01/13 AT 0431 QA FLAGS AND/OR RANGES MODIFIED BY DEMOGRAPHIC UPDATE ON 01/13 AT 0459 QA FLAGS AND/OR RANGES MODIFIED BY DEMOGRAPHIC UPDATE ON 01/13 AT 0541 QA FLAGS AND/OR RANGES MODIFIED BY DEMOGRAPHIC UPDATE ON 01/13 AT 1157 QA FLAGS AND/OR RANGES MODIFIED BY DEMOGRAPHIC UPDATE ON 01/13 AT 1054   Urinalysis, Routine w reflex microscopic (not at [facility])     Status: Abnormal   Collection Time: 09/19/15  3:33 AM  Result Value Ref Range   Color, Urine YELLOW YELLOW    Comment: QA FLAGS AND/OR RANGES MODIFIED BY DEMOGRAPHIC UPDATE ON 01/13 AT 0431 QA  FLAGS AND/OR RANGES MODIFIED BY DEMOGRAPHIC UPDATE ON 01/13 AT 0459 QA FLAGS AND/OR RANGES MODIFIED BY DEMOGRAPHIC UPDATE ON 01/13 AT 0541 QA FLAGS AND/OR RANGES MODIFIED BY DEMOGRAPHIC UPDATE ON 01/13 AT 0806 QA FLAGS AND/OR RANGES MODIFIED BY DEMOGRAPHIC UPDATE ON 01/13 AT 1054    APPearance CLEAR CLEAR    Comment: QA FLAGS AND/OR RANGES MODIFIED BY DEMOGRAPHIC UPDATE ON 01/13 AT 0431 QA FLAGS AND/OR RANGES MODIFIED BY DEMOGRAPHIC UPDATE ON 01/13 AT 0459 QA FLAGS AND/OR RANGES MODIFIED BY DEMOGRAPHIC UPDATE ON 01/13 AT 0541 QA FLAGS AND/OR RANGES MODIFIED BY DEMOGRAPHIC UPDATE ON 01/13 AT 0806 QA FLAGS AND/OR RANGES MODIFIED BY DEMOGRAPHIC UPDATE ON 01/13 AT 1054    Specific Gravity, Urine 1.010 1.005 - 1.030    Comment: QA FLAGS AND/OR RANGES MODIFIED BY DEMOGRAPHIC UPDATE ON 01/13 AT 0431 QA FLAGS AND/OR RANGES MODIFIED BY DEMOGRAPHIC UPDATE ON 01/13 AT 0459 QA FLAGS AND/OR RANGES MODIFIED BY DEMOGRAPHIC UPDATE ON 01/13 AT 0541 QA FLAGS AND/OR RANGES MODIFIED BY DEMOGRAPHIC UPDATE ON 01/13 AT 0806 QA FLAGS AND/OR RANGES MODIFIED BY DEMOGRAPHIC UPDATE ON 01/13 AT 1054    pH 5.0 5.0 - 8.0    Comment: QA FLAGS AND/OR RANGES MODIFIED BY DEMOGRAPHIC UPDATE ON 01/13 AT 0431 QA FLAGS AND/OR RANGES MODIFIED BY DEMOGRAPHIC UPDATE ON 01/13 AT 0459 QA FLAGS AND/OR RANGES MODIFIED BY DEMOGRAPHIC UPDATE ON 01/13 AT 0541 QA FLAGS AND/OR RANGES MODIFIED BY DEMOGRAPHIC UPDATE ON 01/13 AT 0806 QA FLAGS AND/OR RANGES MODIFIED BY DEMOGRAPHIC UPDATE ON 01/13 AT 1054    Glucose, UA 100 (A) NEGATIVE mg/dL    Comment: QA FLAGS AND/OR RANGES MODIFIED BY DEMOGRAPHIC UPDATE ON 01/13 AT 0431 QA FLAGS AND/OR RANGES MODIFIED BY DEMOGRAPHIC UPDATE ON 01/13 AT 0459 QA FLAGS AND/OR RANGES MODIFIED BY DEMOGRAPHIC UPDATE ON 01/13 AT 0541 QA FLAGS AND/OR RANGES MODIFIED BY DEMOGRAPHIC UPDATE ON 01/13 AT 0806 QA FLAGS AND/OR RANGES MODIFIED BY DEMOGRAPHIC UPDATE ON 01/13 AT 1054    Hgb urine dipstick SMALL (A)  NEGATIVE    Comment: QA FLAGS AND/OR RANGES MODIFIED BY DEMOGRAPHIC UPDATE ON 01/13 AT 0431 QA FLAGS AND/OR RANGES MODIFIED BY DEMOGRAPHIC UPDATE ON 01/13 AT 0459 QA FLAGS AND/OR RANGES MODIFIED BY DEMOGRAPHIC UPDATE ON 01/13 AT 0541 QA FLAGS AND/OR RANGES MODIFIED BY DEMOGRAPHIC UPDATE ON 01/13 AT 0806 QA FLAGS AND/OR RANGES MODIFIED BY DEMOGRAPHIC UPDATE ON 01/13 AT 1054    Bilirubin Urine NEGATIVE NEGATIVE    Comment: QA FLAGS AND/OR RANGES MODIFIED BY DEMOGRAPHIC UPDATE ON 01/13 AT 0431 QA FLAGS AND/OR RANGES MODIFIED BY DEMOGRAPHIC UPDATE ON 01/13 AT 0459 QA FLAGS AND/OR RANGES MODIFIED BY DEMOGRAPHIC UPDATE ON 01/13 AT 0541 QA FLAGS AND/OR RANGES MODIFIED BY DEMOGRAPHIC UPDATE ON 01/13 AT 2620 QA FLAGS  AND/OR RANGES MODIFIED BY DEMOGRAPHIC UPDATE ON 01/13 AT 1054    Ketones, ur NEGATIVE NEGATIVE mg/dL    Comment: QA FLAGS AND/OR RANGES MODIFIED BY DEMOGRAPHIC UPDATE ON 01/13 AT 0431 QA FLAGS AND/OR RANGES MODIFIED BY DEMOGRAPHIC UPDATE ON 01/13 AT 0459 QA FLAGS AND/OR RANGES MODIFIED BY DEMOGRAPHIC UPDATE ON 01/13 AT 0541 QA FLAGS AND/OR RANGES MODIFIED BY DEMOGRAPHIC UPDATE ON 01/13 AT 0806 QA FLAGS AND/OR RANGES MODIFIED BY DEMOGRAPHIC UPDATE ON 01/13 AT 1054    Protein, ur 30 (A) NEGATIVE mg/dL    Comment: QA FLAGS AND/OR RANGES MODIFIED BY DEMOGRAPHIC UPDATE ON 01/13 AT 0431 QA FLAGS AND/OR RANGES MODIFIED BY DEMOGRAPHIC UPDATE ON 01/13 AT 0459 QA FLAGS AND/OR RANGES MODIFIED BY DEMOGRAPHIC UPDATE ON 01/13 AT 0541 QA FLAGS AND/OR RANGES MODIFIED BY DEMOGRAPHIC UPDATE ON 01/13 AT 0806 QA FLAGS AND/OR RANGES MODIFIED BY DEMOGRAPHIC UPDATE ON 01/13 AT 1054    Nitrite NEGATIVE NEGATIVE    Comment: QA FLAGS AND/OR RANGES MODIFIED BY DEMOGRAPHIC UPDATE ON 01/13 AT 0431 QA FLAGS AND/OR RANGES MODIFIED BY DEMOGRAPHIC UPDATE ON 01/13 AT 0459 QA FLAGS AND/OR RANGES MODIFIED BY DEMOGRAPHIC UPDATE ON 01/13 AT 0541 QA FLAGS AND/OR RANGES MODIFIED BY DEMOGRAPHIC UPDATE ON 01/13 AT  0806 QA FLAGS AND/OR RANGES MODIFIED BY DEMOGRAPHIC UPDATE ON 01/13 AT 1054    Leukocytes, UA NEGATIVE NEGATIVE    Comment: QA FLAGS AND/OR RANGES MODIFIED BY DEMOGRAPHIC UPDATE ON 01/13 AT 0431 QA FLAGS AND/OR RANGES MODIFIED BY DEMOGRAPHIC UPDATE ON 01/13 AT 0459 QA FLAGS AND/OR RANGES MODIFIED BY DEMOGRAPHIC UPDATE ON 01/13 AT 0541 QA FLAGS AND/OR RANGES MODIFIED BY DEMOGRAPHIC UPDATE ON 01/13 AT 0806 QA FLAGS AND/OR RANGES MODIFIED BY DEMOGRAPHIC UPDATE ON 01/13 AT 1054   Urine microscopic-add on     Status: Abnormal   Collection Time: 09/19/15  3:33 AM  Result Value Ref Range   Squamous Epithelial / LPF 0-5 (A) NONE SEEN    Comment: QA FLAGS AND/OR RANGES MODIFIED BY DEMOGRAPHIC UPDATE ON 01/13 AT 0431 QA FLAGS AND/OR RANGES MODIFIED BY DEMOGRAPHIC UPDATE ON 01/13 AT 0459 QA FLAGS AND/OR RANGES MODIFIED BY DEMOGRAPHIC UPDATE ON 01/13 AT 0541 QA FLAGS AND/OR RANGES MODIFIED BY DEMOGRAPHIC UPDATE ON 01/13 AT 0806 QA FLAGS AND/OR RANGES MODIFIED BY DEMOGRAPHIC UPDATE ON 01/13 AT 1054    WBC, UA 0-5 0 - 5 WBC/hpf    Comment: QA FLAGS AND/OR RANGES MODIFIED BY DEMOGRAPHIC UPDATE ON 01/13 AT 0431 QA FLAGS AND/OR RANGES MODIFIED BY DEMOGRAPHIC UPDATE ON 01/13 AT 0459 QA FLAGS AND/OR RANGES MODIFIED BY DEMOGRAPHIC UPDATE ON 01/13 AT 0541 QA FLAGS AND/OR RANGES MODIFIED BY DEMOGRAPHIC UPDATE ON 01/13 AT 0806 QA FLAGS AND/OR RANGES MODIFIED BY DEMOGRAPHIC UPDATE ON 01/13 AT 1054    RBC / HPF 0-5 0 - 5 RBC/hpf    Comment: QA FLAGS AND/OR RANGES MODIFIED BY DEMOGRAPHIC UPDATE ON 01/13 AT 0431 QA FLAGS AND/OR RANGES MODIFIED BY DEMOGRAPHIC UPDATE ON 01/13 AT 0459 QA FLAGS AND/OR RANGES MODIFIED BY DEMOGRAPHIC UPDATE ON 01/13 AT 0541 QA FLAGS AND/OR RANGES MODIFIED BY DEMOGRAPHIC UPDATE ON 01/13 AT 0806 QA FLAGS AND/OR RANGES MODIFIED BY DEMOGRAPHIC UPDATE ON 01/13 AT 1054    Bacteria, UA RARE (A) NONE SEEN    Comment: QA FLAGS AND/OR RANGES MODIFIED BY DEMOGRAPHIC UPDATE ON 01/13 AT  0431 QA FLAGS AND/OR RANGES MODIFIED BY DEMOGRAPHIC UPDATE ON 01/13 AT 0459 QA FLAGS AND/OR RANGES MODIFIED BY DEMOGRAPHIC UPDATE ON 01/13 AT 0541 QA FLAGS AND/OR RANGES MODIFIED BY  DEMOGRAPHIC UPDATE ON 01/13 AT 0806 QA FLAGS AND/OR RANGES MODIFIED BY DEMOGRAPHIC UPDATE ON 01/13 AT 1054   I-Stat arterial blood gas, ED     Status: Abnormal   Collection Time: 09/19/15  3:40 AM  Result Value Ref Range   pH, Arterial 7.144 (LL) 7.350 - 7.450    Comment: QA FLAGS AND/OR RANGES MODIFIED BY DEMOGRAPHIC UPDATE ON 01/13 AT 0431 QA FLAGS AND/OR RANGES MODIFIED BY DEMOGRAPHIC UPDATE ON 01/13 AT 0459 QA FLAGS AND/OR RANGES MODIFIED BY DEMOGRAPHIC UPDATE ON 01/13 AT 0541    pCO2 arterial 55.2 (H) 35.0 - 45.0 mmHg    Comment: QA FLAGS AND/OR RANGES MODIFIED BY DEMOGRAPHIC UPDATE ON 01/13 AT 0431 QA FLAGS AND/OR RANGES MODIFIED BY DEMOGRAPHIC UPDATE ON 01/13 AT 0459 QA FLAGS AND/OR RANGES MODIFIED BY DEMOGRAPHIC UPDATE ON 01/13 AT 0541    pO2, Arterial 203.0 (H) 80.0 - 100.0 mmHg    Comment: QA FLAGS AND/OR RANGES MODIFIED BY DEMOGRAPHIC UPDATE ON 01/13 AT 0431 QA FLAGS AND/OR RANGES MODIFIED BY DEMOGRAPHIC UPDATE ON 01/13 AT 0459 QA FLAGS AND/OR RANGES MODIFIED BY DEMOGRAPHIC UPDATE ON 01/13 AT 0541    Bicarbonate 18.9 (L) 20.0 - 24.0 mEq/L    Comment: QA FLAGS AND/OR RANGES MODIFIED BY DEMOGRAPHIC UPDATE ON 01/13 AT 0431 QA FLAGS AND/OR RANGES MODIFIED BY DEMOGRAPHIC UPDATE ON 01/13 AT 0459 QA FLAGS AND/OR RANGES MODIFIED BY DEMOGRAPHIC UPDATE ON 01/13 AT 0541    TCO2 21 0 - 100 mmol/L    Comment: QA FLAGS AND/OR RANGES MODIFIED BY DEMOGRAPHIC UPDATE ON 01/13 AT 0431 QA FLAGS AND/OR RANGES MODIFIED BY DEMOGRAPHIC UPDATE ON 01/13 AT 0459 QA FLAGS AND/OR RANGES MODIFIED BY DEMOGRAPHIC UPDATE ON 01/13 AT 0541    O2 Saturation 99.0 %   Acid-base deficit 10.0 (H) 0.0 - 2.0 mmol/L    Comment: QA FLAGS AND/OR RANGES MODIFIED BY DEMOGRAPHIC UPDATE ON 01/13 AT 0431 QA FLAGS AND/OR RANGES MODIFIED BY  DEMOGRAPHIC UPDATE ON 01/13 AT 0459 QA FLAGS AND/OR RANGES MODIFIED BY DEMOGRAPHIC UPDATE ON 01/13 AT 0541    Patient temperature 98.9 F    Collection site RADIAL, ALLEN'S TEST ACCEPTABLE    Drawn by RT    Sample type ARTERIAL    Comment NOTIFIED PHYSICIAN   I-STAT 3, arterial blood gas (G3+)     Status: Abnormal   Collection Time: 09/19/15  3:40 AM  Result Value Ref Range   pH, Arterial 7.144 (LL) 7.350 - 7.450    Comment: QA FLAGS AND/OR RANGES MODIFIED BY DEMOGRAPHIC UPDATE ON 01/13 AT 0431 QA FLAGS AND/OR RANGES MODIFIED BY DEMOGRAPHIC UPDATE ON 01/13 AT 0459 QA FLAGS AND/OR RANGES MODIFIED BY DEMOGRAPHIC UPDATE ON 01/13 AT 0541 QA FLAGS AND/OR RANGES MODIFIED BY DEMOGRAPHIC UPDATE ON 01/13 AT 0806 QA FLAGS AND/OR RANGES MODIFIED BY DEMOGRAPHIC UPDATE ON 01/13 AT 1054    pCO2 arterial 55.2 (H) 35.0 - 45.0 mmHg    Comment: QA FLAGS AND/OR RANGES MODIFIED BY DEMOGRAPHIC UPDATE ON 01/13 AT 0431 QA FLAGS AND/OR RANGES MODIFIED BY DEMOGRAPHIC UPDATE ON 01/13 AT 0459 QA FLAGS AND/OR RANGES MODIFIED BY DEMOGRAPHIC UPDATE ON 01/13 AT 0541 QA FLAGS AND/OR RANGES MODIFIED BY DEMOGRAPHIC UPDATE ON 01/13 AT 0806 QA FLAGS AND/OR RANGES MODIFIED BY DEMOGRAPHIC UPDATE ON 01/13 AT 1054    pO2, Arterial 203.0 (H) 80.0 - 100.0 mmHg    Comment: QA FLAGS AND/OR RANGES MODIFIED BY DEMOGRAPHIC UPDATE ON 01/13 AT 0431 QA FLAGS AND/OR RANGES MODIFIED BY DEMOGRAPHIC UPDATE ON 01/13 AT [place]  QA FLAGS AND/OR RANGES MODIFIED BY DEMOGRAPHIC UPDATE ON 01/13 AT 0541 QA FLAGS AND/OR RANGES MODIFIED BY DEMOGRAPHIC UPDATE ON 01/13 AT 0806 QA FLAGS AND/OR RANGES MODIFIED BY DEMOGRAPHIC UPDATE ON 01/13 AT 1054    Bicarbonate 18.9 (L) 20.0 - 24.0 mEq/L    Comment: QA FLAGS AND/OR RANGES MODIFIED BY DEMOGRAPHIC UPDATE ON 01/13 AT 0431 QA FLAGS AND/OR RANGES MODIFIED BY DEMOGRAPHIC UPDATE ON 01/13 AT 0459 QA FLAGS AND/OR RANGES MODIFIED BY DEMOGRAPHIC UPDATE ON 01/13 AT 0541 QA FLAGS AND/OR RANGES MODIFIED BY  DEMOGRAPHIC UPDATE ON 01/13 AT 0806 QA FLAGS AND/OR RANGES MODIFIED BY DEMOGRAPHIC UPDATE ON 01/13 AT 1054    TCO2 21 0 - 100 mmol/L    Comment: QA FLAGS AND/OR RANGES MODIFIED BY DEMOGRAPHIC UPDATE ON 01/13 AT 0431 QA FLAGS AND/OR RANGES MODIFIED BY DEMOGRAPHIC UPDATE ON 01/13 AT 0459 QA FLAGS AND/OR RANGES MODIFIED BY DEMOGRAPHIC UPDATE ON 01/13 AT 0541 QA FLAGS AND/OR RANGES MODIFIED BY DEMOGRAPHIC UPDATE ON 01/13 AT 0806 QA FLAGS AND/OR RANGES MODIFIED BY DEMOGRAPHIC UPDATE ON 01/13 AT 1054    O2 Saturation 99.0 %   Acid-base deficit 10.0 (H) 0.0 - 2.0 mmol/L    Comment: QA FLAGS AND/OR RANGES MODIFIED BY DEMOGRAPHIC UPDATE ON 01/13 AT 0431 QA FLAGS AND/OR RANGES MODIFIED BY DEMOGRAPHIC UPDATE ON 01/13 AT 0459 QA FLAGS AND/OR RANGES MODIFIED BY DEMOGRAPHIC UPDATE ON 01/13 AT 0541 QA FLAGS AND/OR RANGES MODIFIED BY DEMOGRAPHIC UPDATE ON 01/13 AT 0806 QA FLAGS AND/OR RANGES MODIFIED BY DEMOGRAPHIC UPDATE ON 01/13 AT 1054    Patient temperature 98.9 F    Collection site RADIAL, ALLEN'S TEST ACCEPTABLE    Drawn by RT    Sample type ARTERIAL    Comment NOTIFIED PHYSICIAN   CBC with Differential     Status: None   Collection Time: 09/19/15  4:28 AM  Result Value Ref Range   WBC 9.4 4.0 - 10.5 K/uL    Comment: QA FLAGS AND/OR RANGES MODIFIED BY DEMOGRAPHIC UPDATE ON 01/13 AT 0459 QA FLAGS AND/OR RANGES MODIFIED BY DEMOGRAPHIC UPDATE ON 01/13 AT 0541 QA FLAGS AND/OR RANGES MODIFIED BY DEMOGRAPHIC UPDATE ON 01/13 AT 0806 QA FLAGS AND/OR RANGES MODIFIED BY DEMOGRAPHIC UPDATE ON 01/13 AT 1054    RBC 4.82 4.22 - 5.81 MIL/uL    Comment: QA FLAGS AND/OR RANGES MODIFIED BY DEMOGRAPHIC UPDATE ON 01/13 AT 0459 QA FLAGS AND/OR RANGES MODIFIED BY DEMOGRAPHIC UPDATE ON 01/13 AT 0541 QA FLAGS AND/OR RANGES MODIFIED BY DEMOGRAPHIC UPDATE ON 01/13 AT 0806 QA FLAGS AND/OR RANGES MODIFIED BY DEMOGRAPHIC UPDATE ON 01/13 AT 1054    Hemoglobin 13.4 13.0 - 17.0 g/dL    Comment: QA FLAGS AND/OR RANGES  MODIFIED BY DEMOGRAPHIC UPDATE ON 01/13 AT 0459 QA FLAGS AND/OR RANGES MODIFIED BY DEMOGRAPHIC UPDATE ON 01/13 AT 0541 QA FLAGS AND/OR RANGES MODIFIED BY DEMOGRAPHIC UPDATE ON 01/13 AT 0806 QA FLAGS AND/OR RANGES MODIFIED BY DEMOGRAPHIC UPDATE ON 01/13 AT 1054    HCT 40.6 39.0 - 52.0 %    Comment: QA FLAGS AND/OR RANGES MODIFIED BY DEMOGRAPHIC UPDATE ON 01/13 AT 0459 QA FLAGS AND/OR RANGES MODIFIED BY DEMOGRAPHIC UPDATE ON 01/13 AT 0541 QA FLAGS AND/OR RANGES MODIFIED BY DEMOGRAPHIC UPDATE ON 01/13 AT 0806 QA FLAGS AND/OR RANGES MODIFIED BY DEMOGRAPHIC UPDATE ON 01/13 AT 1054    MCV 84.2 78.0 - 100.0 fL    Comment: QA FLAGS AND/OR RANGES MODIFIED BY DEMOGRAPHIC UPDATE ON 01/13 AT 0459 QA FLAGS AND/OR RANGES MODIFIED BY DEMOGRAPHIC UPDATE  ON 01/13 AT 0541 QA FLAGS AND/OR RANGES MODIFIED BY DEMOGRAPHIC UPDATE ON 01/13 AT 0806 QA FLAGS AND/OR RANGES MODIFIED BY DEMOGRAPHIC UPDATE ON 01/13 AT 1054    MCH 27.8 26.0 - 34.0 pg    Comment: QA FLAGS AND/OR RANGES MODIFIED BY DEMOGRAPHIC UPDATE ON 01/13 AT 0459 QA FLAGS AND/OR RANGES MODIFIED BY DEMOGRAPHIC UPDATE ON 01/13 AT 0541 QA FLAGS AND/OR RANGES MODIFIED BY DEMOGRAPHIC UPDATE ON 01/13 AT 0806 QA FLAGS AND/OR RANGES MODIFIED BY DEMOGRAPHIC UPDATE ON 01/13 AT 1054    MCHC 33.0 30.0 - 36.0 g/dL    Comment: QA FLAGS AND/OR RANGES MODIFIED BY DEMOGRAPHIC UPDATE ON 01/13 AT 0459 QA FLAGS AND/OR RANGES MODIFIED BY DEMOGRAPHIC UPDATE ON 01/13 AT 0541 QA FLAGS AND/OR RANGES MODIFIED BY DEMOGRAPHIC UPDATE ON 01/13 AT 0806 QA FLAGS AND/OR RANGES MODIFIED BY DEMOGRAPHIC UPDATE ON 01/13 AT 1054    RDW 12.0 11.5 - 15.5 %    Comment: QA FLAGS AND/OR RANGES MODIFIED BY DEMOGRAPHIC UPDATE ON 01/13 AT 0459 QA FLAGS AND/OR RANGES MODIFIED BY DEMOGRAPHIC UPDATE ON 01/13 AT 0541 QA FLAGS AND/OR RANGES MODIFIED BY DEMOGRAPHIC UPDATE ON 01/13 AT 0806 QA FLAGS AND/OR RANGES MODIFIED BY DEMOGRAPHIC UPDATE ON 01/13 AT 1054    Platelets 269 150 - 400 K/uL     Comment: QA FLAGS AND/OR RANGES MODIFIED BY DEMOGRAPHIC UPDATE ON 01/13 AT 0459 QA FLAGS AND/OR RANGES MODIFIED BY DEMOGRAPHIC UPDATE ON 01/13 AT 0541 QA FLAGS AND/OR RANGES MODIFIED BY DEMOGRAPHIC UPDATE ON 01/13 AT 0806 QA FLAGS AND/OR RANGES MODIFIED BY DEMOGRAPHIC UPDATE ON 01/13 AT 1054    Neutrophils Relative % 80 %   Neutro Abs 7.5 1.7 - 7.7 K/uL    Comment: QA FLAGS AND/OR RANGES MODIFIED BY DEMOGRAPHIC UPDATE ON 01/13 AT 0459 QA FLAGS AND/OR RANGES MODIFIED BY DEMOGRAPHIC UPDATE ON 01/13 AT 0541 QA FLAGS AND/OR RANGES MODIFIED BY DEMOGRAPHIC UPDATE ON 01/13 AT 0806 QA FLAGS AND/OR RANGES MODIFIED BY DEMOGRAPHIC UPDATE ON 01/13 AT 1054    Lymphocytes Relative 13 %   Lymphs Abs 1.2 0.7 - 4.0 K/uL    Comment: QA FLAGS AND/OR RANGES MODIFIED BY DEMOGRAPHIC UPDATE ON 01/13 AT 0459 QA FLAGS AND/OR RANGES MODIFIED BY DEMOGRAPHIC UPDATE ON 01/13 AT 0541 QA FLAGS AND/OR RANGES MODIFIED BY DEMOGRAPHIC UPDATE ON 01/13 AT 0806 QA FLAGS AND/OR RANGES MODIFIED BY DEMOGRAPHIC UPDATE ON 01/13 AT 1054    Monocytes Relative 7 %   Monocytes Absolute 0.7 0.1 - 1.0 K/uL    Comment: QA FLAGS AND/OR RANGES MODIFIED BY DEMOGRAPHIC UPDATE ON 01/13 AT 0459 QA FLAGS AND/OR RANGES MODIFIED BY DEMOGRAPHIC UPDATE ON 01/13 AT 0541 QA FLAGS AND/OR RANGES MODIFIED BY DEMOGRAPHIC UPDATE ON 01/13 AT 0806 QA FLAGS AND/OR RANGES MODIFIED BY DEMOGRAPHIC UPDATE ON 01/13 AT 1054    Eosinophils Relative 0 %   Eosinophils Absolute 0.0 0.0 - 0.7 K/uL    Comment: QA FLAGS AND/OR RANGES MODIFIED BY DEMOGRAPHIC UPDATE ON 01/13 AT 0459 QA FLAGS AND/OR RANGES MODIFIED BY DEMOGRAPHIC UPDATE ON 01/13 AT 0541 QA FLAGS AND/OR RANGES MODIFIED BY DEMOGRAPHIC UPDATE ON 01/13 AT 0806 QA FLAGS AND/OR RANGES MODIFIED BY DEMOGRAPHIC UPDATE ON 01/13 AT 1054    Basophils Relative 0 %   Basophils Absolute 0.0 0.0 - 0.1 K/uL    Comment: QA FLAGS AND/OR RANGES MODIFIED BY DEMOGRAPHIC UPDATE ON 01/13 AT 0459 QA FLAGS AND/OR RANGES  MODIFIED BY DEMOGRAPHIC UPDATE ON 01/13 AT 0541 QA FLAGS AND/OR RANGES MODIFIED BY DEMOGRAPHIC UPDATE ON  01/13 AT 0806 QA FLAGS AND/OR RANGES MODIFIED BY DEMOGRAPHIC UPDATE ON 01/13 AT 1054   Comprehensive metabolic panel     Status: Abnormal   Collection Time: 09/19/15  4:28 AM  Result Value Ref Range   Sodium 141 135 - 145 mmol/L    Comment: QA FLAGS AND/OR RANGES MODIFIED BY DEMOGRAPHIC UPDATE ON 01/13 AT 0541 QA FLAGS AND/OR RANGES MODIFIED BY DEMOGRAPHIC UPDATE ON 01/13 AT 0806 QA FLAGS AND/OR RANGES MODIFIED BY DEMOGRAPHIC UPDATE ON 01/13 AT 1054    Potassium 3.6 3.5 - 5.1 mmol/L    Comment: QA FLAGS AND/OR RANGES MODIFIED BY DEMOGRAPHIC UPDATE ON 01/13 AT 0541 QA FLAGS AND/OR RANGES MODIFIED BY DEMOGRAPHIC UPDATE ON 01/13 AT 0806 QA FLAGS AND/OR RANGES MODIFIED BY DEMOGRAPHIC UPDATE ON 01/13 AT 1054    Chloride 107 101 - 111 mmol/L    Comment: QA FLAGS AND/OR RANGES MODIFIED BY DEMOGRAPHIC UPDATE ON 01/13 AT 0541 QA FLAGS AND/OR RANGES MODIFIED BY DEMOGRAPHIC UPDATE ON 01/13 AT 0806 QA FLAGS AND/OR RANGES MODIFIED BY DEMOGRAPHIC UPDATE ON 01/13 AT 1054    CO2 22 22 - 32 mmol/L    Comment: QA FLAGS AND/OR RANGES MODIFIED BY DEMOGRAPHIC UPDATE ON 01/13 AT 0541 QA FLAGS AND/OR RANGES MODIFIED BY DEMOGRAPHIC UPDATE ON 01/13 AT 0806 QA FLAGS AND/OR RANGES MODIFIED BY DEMOGRAPHIC UPDATE ON 01/13 AT 1054    Glucose, Bld 156 (H) 65 - 99 mg/dL    Comment: QA FLAGS AND/OR RANGES MODIFIED BY DEMOGRAPHIC UPDATE ON 01/13 AT 0541 QA FLAGS AND/OR RANGES MODIFIED BY DEMOGRAPHIC UPDATE ON 01/13 AT 0806 QA FLAGS AND/OR RANGES MODIFIED BY DEMOGRAPHIC UPDATE ON 01/13 AT 1054    BUN 9 6 - 20 mg/dL    Comment: QA FLAGS AND/OR RANGES MODIFIED BY DEMOGRAPHIC UPDATE ON 01/13 AT 0541 QA FLAGS AND/OR RANGES MODIFIED BY DEMOGRAPHIC UPDATE ON 01/13 AT 0806 QA FLAGS AND/OR RANGES MODIFIED BY DEMOGRAPHIC UPDATE ON 01/13 AT 1054    Creatinine, Ser 1.05 0.61 - 1.24 mg/dL    Comment: QA FLAGS AND/OR  RANGES MODIFIED BY DEMOGRAPHIC UPDATE ON 01/13 AT 0541 QA FLAGS AND/OR RANGES MODIFIED BY DEMOGRAPHIC UPDATE ON 01/13 AT 0806 QA FLAGS AND/OR RANGES MODIFIED BY DEMOGRAPHIC UPDATE ON 01/13 AT 1054    Calcium 8.9 8.9 - 10.3 mg/dL    Comment: QA FLAGS AND/OR RANGES MODIFIED BY DEMOGRAPHIC UPDATE ON 01/13 AT 0541 QA FLAGS AND/OR RANGES MODIFIED BY DEMOGRAPHIC UPDATE ON 01/13 AT 0806 QA FLAGS AND/OR RANGES MODIFIED BY DEMOGRAPHIC UPDATE ON 01/13 AT 1054    Total Protein 7.6 6.5 - 8.1 g/dL    Comment: QA FLAGS AND/OR RANGES MODIFIED BY DEMOGRAPHIC UPDATE ON 01/13 AT 0541 QA FLAGS AND/OR RANGES MODIFIED BY DEMOGRAPHIC UPDATE ON 01/13 AT 0806 QA FLAGS AND/OR RANGES MODIFIED BY DEMOGRAPHIC UPDATE ON 01/13 AT 1054    Albumin 4.0 3.5 - 5.0 g/dL    Comment: QA FLAGS AND/OR RANGES MODIFIED BY DEMOGRAPHIC UPDATE ON 01/13 AT 0541 QA FLAGS AND/OR RANGES MODIFIED BY DEMOGRAPHIC UPDATE ON 01/13 AT 0806 QA FLAGS AND/OR RANGES MODIFIED BY DEMOGRAPHIC UPDATE ON 01/13 AT 1054    AST 47 (H) 15 - 41 U/L    Comment: QA FLAGS AND/OR RANGES MODIFIED BY DEMOGRAPHIC UPDATE ON 01/13 AT 0541 QA FLAGS AND/OR RANGES MODIFIED BY DEMOGRAPHIC UPDATE ON 01/13 AT 0806 QA FLAGS AND/OR RANGES MODIFIED BY DEMOGRAPHIC UPDATE ON 01/13 AT 1054    ALT 50 17 - 63 U/L    Comment: QA FLAGS AND/OR RANGES  MODIFIED BY DEMOGRAPHIC UPDATE ON 01/13 AT 0541 QA FLAGS AND/OR RANGES MODIFIED BY DEMOGRAPHIC UPDATE ON 01/13 AT 9937 QA FLAGS AND/OR RANGES MODIFIED BY DEMOGRAPHIC UPDATE ON 01/13 AT 1054    Alkaline Phosphatase 70 38 - 126 U/L    Comment: QA FLAGS AND/OR RANGES MODIFIED BY DEMOGRAPHIC UPDATE ON 01/13 AT 0541 QA FLAGS AND/OR RANGES MODIFIED BY DEMOGRAPHIC UPDATE ON 01/13 AT 1696 QA FLAGS AND/OR RANGES MODIFIED BY DEMOGRAPHIC UPDATE ON 01/13 AT 1054    Total Bilirubin 0.5 0.3 - 1.2 mg/dL    Comment: QA FLAGS AND/OR RANGES MODIFIED BY DEMOGRAPHIC UPDATE ON 01/13 AT 0541 QA FLAGS AND/OR RANGES MODIFIED BY DEMOGRAPHIC UPDATE ON  01/13 AT 7893 QA FLAGS AND/OR RANGES MODIFIED BY DEMOGRAPHIC UPDATE ON 01/13 AT 1054    GFR calc non Af Amer NOT CALCULATED >60 mL/min    Comment: QA FLAGS AND/OR RANGES MODIFIED BY DEMOGRAPHIC UPDATE ON 01/13 AT 0541 QA FLAGS AND/OR RANGES MODIFIED BY DEMOGRAPHIC UPDATE ON 01/13 AT 8101 QA FLAGS AND/OR RANGES MODIFIED BY DEMOGRAPHIC UPDATE ON 01/13 AT 1054    GFR calc Af Amer NOT CALCULATED >60 mL/min    Comment: QA FLAGS AND/OR RANGES MODIFIED BY DEMOGRAPHIC UPDATE ON 01/13 AT 0541 QA FLAGS AND/OR RANGES MODIFIED BY DEMOGRAPHIC UPDATE ON 01/13 AT 0806 QA FLAGS AND/OR RANGES MODIFIED BY DEMOGRAPHIC UPDATE ON 01/13 AT 1054 (NOTE) The eGFR has been calculated using the CKD EPI equation. This calculation has not been validated in all clinical situations. eGFR's persistently <60 mL/min signify possible Chronic Kidney Disease.    Anion gap 12 5 - 15    Comment: QA FLAGS AND/OR RANGES MODIFIED BY DEMOGRAPHIC UPDATE ON 01/13 AT 0541 QA FLAGS AND/OR RANGES MODIFIED BY DEMOGRAPHIC UPDATE ON 01/13 AT 0806 QA FLAGS AND/OR RANGES MODIFIED BY DEMOGRAPHIC UPDATE ON 01/13 AT 1054   I-Stat arterial blood gas, ED     Status: Abnormal   Collection Time: 09/19/15  4:34 AM  Result Value Ref Range   pH, Arterial 7.347 (L) 7.350 - 7.450    Comment: QA FLAGS AND/OR RANGES MODIFIED BY DEMOGRAPHIC UPDATE ON 01/13 AT 0459 QA FLAGS AND/OR RANGES MODIFIED BY DEMOGRAPHIC UPDATE ON 01/13 AT 0541    pCO2 arterial 42.9 35.0 - 45.0 mmHg    Comment: QA FLAGS AND/OR RANGES MODIFIED BY DEMOGRAPHIC UPDATE ON 01/13 AT 0459 QA FLAGS AND/OR RANGES MODIFIED BY DEMOGRAPHIC UPDATE ON 01/13 AT 0541    pO2, Arterial 132.0 (H) 80.0 - 100.0 mmHg    Comment: QA FLAGS AND/OR RANGES MODIFIED BY DEMOGRAPHIC UPDATE ON 01/13 AT 0459 QA FLAGS AND/OR RANGES MODIFIED BY DEMOGRAPHIC UPDATE ON 01/13 AT 0541    Bicarbonate 23.5 20.0 - 24.0 mEq/L    Comment: QA FLAGS AND/OR RANGES MODIFIED BY DEMOGRAPHIC UPDATE ON 01/13 AT 0459 QA  FLAGS AND/OR RANGES MODIFIED BY DEMOGRAPHIC UPDATE ON 01/13 AT 0541    TCO2 25 0 - 100 mmol/L    Comment: QA FLAGS AND/OR RANGES MODIFIED BY DEMOGRAPHIC UPDATE ON 01/13 AT 0459 QA FLAGS AND/OR RANGES MODIFIED BY DEMOGRAPHIC UPDATE ON 01/13 AT 0541    O2 Saturation 99.0 %   Acid-base deficit 2.0 0.0 - 2.0 mmol/L    Comment: QA FLAGS AND/OR RANGES MODIFIED BY DEMOGRAPHIC UPDATE ON 01/13 AT 0459 QA FLAGS AND/OR RANGES MODIFIED BY DEMOGRAPHIC UPDATE ON 01/13 AT 0541    Patient temperature 98.9 F    Collection site RADIAL, ALLEN'S TEST ACCEPTABLE    Drawn by RT    Sample  type ARTERIAL   I-STAT 3, arterial blood gas (G3+)     Status: Abnormal   Collection Time: 09/19/15  4:34 AM  Result Value Ref Range   pH, Arterial 7.347 (L) 7.350 - 7.450    Comment: QA FLAGS AND/OR RANGES MODIFIED BY DEMOGRAPHIC UPDATE ON 01/13 AT 0459 QA FLAGS AND/OR RANGES MODIFIED BY DEMOGRAPHIC UPDATE ON 01/13 AT 0541 QA FLAGS AND/OR RANGES MODIFIED BY DEMOGRAPHIC UPDATE ON 01/13 AT 0806 QA FLAGS AND/OR RANGES MODIFIED BY DEMOGRAPHIC UPDATE ON 01/13 AT 1054    pCO2 arterial 42.9 35.0 - 45.0 mmHg    Comment: QA FLAGS AND/OR RANGES MODIFIED BY DEMOGRAPHIC UPDATE ON 01/13 AT 0459 QA FLAGS AND/OR RANGES MODIFIED BY DEMOGRAPHIC UPDATE ON 01/13 AT 0541 QA FLAGS AND/OR RANGES MODIFIED BY DEMOGRAPHIC UPDATE ON 01/13 AT 0806 QA FLAGS AND/OR RANGES MODIFIED BY DEMOGRAPHIC UPDATE ON 01/13 AT 1054    pO2, Arterial 132.0 (H) 80.0 - 100.0 mmHg    Comment: QA FLAGS AND/OR RANGES MODIFIED BY DEMOGRAPHIC UPDATE ON 01/13 AT 0459 QA FLAGS AND/OR RANGES MODIFIED BY DEMOGRAPHIC UPDATE ON 01/13 AT 0541 QA FLAGS AND/OR RANGES MODIFIED BY DEMOGRAPHIC UPDATE ON 01/13 AT 0806 QA FLAGS AND/OR RANGES MODIFIED BY DEMOGRAPHIC UPDATE ON 01/13 AT 1054    Bicarbonate 23.5 20.0 - 24.0 mEq/L    Comment: QA FLAGS AND/OR RANGES MODIFIED BY DEMOGRAPHIC UPDATE ON 01/13 AT 0459 QA FLAGS AND/OR RANGES MODIFIED BY DEMOGRAPHIC UPDATE ON 01/13 AT  0541 QA FLAGS AND/OR RANGES MODIFIED BY DEMOGRAPHIC UPDATE ON 01/13 AT 0806 QA FLAGS AND/OR RANGES MODIFIED BY DEMOGRAPHIC UPDATE ON 01/13 AT 1054    TCO2 25 0 - 100 mmol/L    Comment: QA FLAGS AND/OR RANGES MODIFIED BY DEMOGRAPHIC UPDATE ON 01/13 AT 0459 QA FLAGS AND/OR RANGES MODIFIED BY DEMOGRAPHIC UPDATE ON 01/13 AT 0541 QA FLAGS AND/OR RANGES MODIFIED BY DEMOGRAPHIC UPDATE ON 01/13 AT 0806 QA FLAGS AND/OR RANGES MODIFIED BY DEMOGRAPHIC UPDATE ON 01/13 AT 1054    O2 Saturation 99.0 %   Acid-base deficit 2.0 0.0 - 2.0 mmol/L    Comment: QA FLAGS AND/OR RANGES MODIFIED BY DEMOGRAPHIC UPDATE ON 01/13 AT 0459 QA FLAGS AND/OR RANGES MODIFIED BY DEMOGRAPHIC UPDATE ON 01/13 AT 0541 QA FLAGS AND/OR RANGES MODIFIED BY DEMOGRAPHIC UPDATE ON 01/13 AT 0806 QA FLAGS AND/OR RANGES MODIFIED BY DEMOGRAPHIC UPDATE ON 01/13 AT 1054    Patient temperature 98.9 F    Collection site RADIAL, ALLEN'S TEST ACCEPTABLE    Drawn by RT    Sample type ARTERIAL   I-Stat CG4 Lactic Acid, ED     Status: Abnormal   Collection Time: 09/19/15  5:46 AM  Result Value Ref Range   Lactic Acid, Venous 2.25 (HH) 0.5 - 2.0 mmol/L   Comment NOTIFIED PHYSICIAN   CG4 I-STAT (Lactic acid)     Status: Abnormal   Collection Time: 09/19/15  5:46 AM  Result Value Ref Range   Lactic Acid, Venous 2.25 (HH) 0.5 - 2.0 mmol/L    Comment: QA FLAGS AND/OR RANGES MODIFIED BY DEMOGRAPHIC UPDATE ON 01/13 AT 1771 QA FLAGS AND/OR RANGES MODIFIED BY DEMOGRAPHIC UPDATE ON 01/13 AT 1054    Comment NOTIFIED PHYSICIAN   TSH     Status: None   Collection Time: 09/19/15  6:09 AM  Result Value Ref Range   TSH 1.555 0.350 - 4.500 uIU/mL    Comment: QA FLAGS AND/OR RANGES MODIFIED BY DEMOGRAPHIC UPDATE ON 01/13 AT 0806 QA FLAGS AND/OR RANGES MODIFIED BY DEMOGRAPHIC UPDATE  ON 01/13 AT 1054   Ethanol     Status: None   Collection Time: 09/19/15  6:10 AM  Result Value Ref Range   Alcohol, Ethyl (B) <5 <5 mg/dL    Comment:        LOWEST  DETECTABLE LIMIT FOR SERUM ALCOHOL IS 5 mg/dL FOR MEDICAL PURPOSES ONLY QA FLAGS AND/OR RANGES MODIFIED BY DEMOGRAPHIC UPDATE ON 01/13 AT 7616 QA FLAGS AND/OR RANGES MODIFIED BY DEMOGRAPHIC UPDATE ON 01/13 AT 1054   Magnesium     Status: None   Collection Time: 09/19/15  6:11 AM  Result Value Ref Range   Magnesium 2.1 1.7 - 2.4 mg/dL    Comment: QA FLAGS AND/OR RANGES MODIFIED BY DEMOGRAPHIC UPDATE ON 01/13 AT 0737 QA FLAGS AND/OR RANGES MODIFIED BY DEMOGRAPHIC UPDATE ON 01/13 AT 1054   Phosphorus     Status: Abnormal   Collection Time: 09/19/15  6:11 AM  Result Value Ref Range   Phosphorus 2.3 (L) 2.5 - 4.6 mg/dL    Comment: QA FLAGS AND/OR RANGES MODIFIED BY DEMOGRAPHIC UPDATE ON 01/13 AT 1062 QA FLAGS AND/OR RANGES MODIFIED BY DEMOGRAPHIC UPDATE ON 01/13 AT 1054   Troponin I (q 6hr x 3)     Status: Abnormal   Collection Time: 09/19/15  6:11 AM  Result Value Ref Range   Troponin I 0.06 (H) <0.031 ng/mL    Comment:        PERSISTENTLY INCREASED TROPONIN VALUES IN THE RANGE OF 0.04-0.49 ng/mL CAN BE SEEN IN:       -UNSTABLE ANGINA       -CONGESTIVE HEART FAILURE       -MYOCARDITIS       -CHEST TRAUMA       -ARRYHTHMIAS       -LATE PRESENTING MYOCARDIAL INFARCTION       -COPD   CLINICAL FOLLOW-UP RECOMMENDED. QA FLAGS AND/OR RANGES MODIFIED BY DEMOGRAPHIC UPDATE ON 01/13 AT 0806 QA FLAGS AND/OR RANGES MODIFIED BY DEMOGRAPHIC UPDATE ON 01/13 AT 1054   MRSA PCR Screening     Status: None   Collection Time: 09/19/15  8:09 AM  Result Value Ref Range   MRSA by PCR NEGATIVE NEGATIVE    Comment:        The GeneXpert MRSA Assay (FDA approved for NASAL specimens only), is one component of a comprehensive MRSA colonization surveillance program. It is not intended to diagnose MRSA infection nor to guide or monitor treatment for MRSA infections.   Glucose, capillary     Status: None   Collection Time: 09/19/15  8:10 AM  Result Value Ref Range   Glucose-Capillary 96 65 - 99  mg/dL    Comment: QA FLAGS AND/OR RANGES MODIFIED BY DEMOGRAPHIC UPDATE ON 01/13 AT 1054  Triglycerides     Status: None   Collection Time: 09/19/15  9:20 AM  Result Value Ref Range   Triglycerides 128 <150 mg/dL    Comment: QA FLAGS AND/OR RANGES MODIFIED BY DEMOGRAPHIC UPDATE ON 01/13 AT 1054  Lactic acid, plasma     Status: None   Collection Time: 09/19/15  9:20 AM  Result Value Ref Range   Lactic Acid, Venous 1.5 0.5 - 2.0 mmol/L    Comment: QA FLAGS AND/OR RANGES MODIFIED BY DEMOGRAPHIC UPDATE ON 01/13 AT 1054    Ct Angio Head W/cm &/or Wo Cm  09/19/2015  CLINICAL DATA:  Follow-up abnormal head CT. EXAM: CT ANGIOGRAPHY HEAD TECHNIQUE: Multidetector CT imaging of the head was performed using the standard  protocol during bolus administration of intravenous contrast. Multiplanar CT image reconstructions and MIPs were obtained to evaluate the vascular anatomy. CONTRAST:  171m OMNIPAQUE IOHEXOL 350 MG/ML SOLN COMPARISON:  Prior CT from earlier the same day. FINDINGS: CTA HEAD Anterior circulation: Visualized distal cervical segments of the internal carotid arteries are widely patent and well opacified. Petrous, cavernous, and supraclinoid ICAs are widely patent. M1 segments widely patent. MCA bifurcations within normal limits. Distal MCA branches symmetric without abnormality. A1 segments symmetric and well opacified. Anterior communicating artery somewhat tortuous but otherwise unremarkable. Right ACA is well opacified to its distal aspect. The left anterior cerebral artery is enlarged relative to the contralateral right A2 segment as it appears to be the main arterial supply for previously identified arteriovenous malformation. AVM is positioned in the parasagittal inferior left frontal lobe, and measures approximately 2.9 x 2.5 x 3.1 cm in size. The AVM itself consists of multiple abnormal tortuous serpiginous vessels clustered together. No obvious flow related arterial aneurysm, although  evaluation somewhat limited. AVM may involve the anterior genu and rostrum of the corpus callosum which are positioned along the posterior margin of the lesion. There is a prominent venous pouch along the left lateral margin of the AVM that measures approximately 18 x 14 x 12 mm (series 8, image 61). The primary venous drainage occurs via an enlarged tortuous vein that extends posteriorly through the mesial left temporal lobe and into the vein of Galen, just prior to the confluence with the straight sinus. There is additional venous drainage superiorly into the superficial cortical venous system just to the left of the anterior superior sagittal sinus (series 11, image 85). Prominent draining vein extends posteriorly within the mesial left frontal lobe as well (series 5, image 69). Additional lateral venous drainage through the anterior/mid left temporal lobe, which may reflect the involved vein of Lab[place]. This venous drainage then extends posteriorly along the surface of the left temporal lobe towards the left transverse sinus. The main venous sinuses themselves including the superior sagittal sinus transverse sinuses and sigmoid sinuses are patent without evidence for venous sinus thrombosis. Posterior circulation: Right vertebral artery is dominant and widely patent to the vertebrobasilar junction. Left vertebral artery is markedly diminutive and terminates in PICA. Posterior inferior cerebral arteries themselves are patent. Tiny 1-2 mm focal outpouching at the vertebrobasilar junction favored to reflect a small infundibulum (series 6, image 124). Basilar artery widely patent. Dominant right anterior inferior cerebral artery noted. Superior cerebellar arteries patent bilaterally. Both posterior cerebral arteries arise from the basilar artery and are well opacified to their distal aspects. Small bilateral posterior communicating arteries noted. Delayed phase:Enhancement of the left frontal AVM. No other abnormal  enhancement. IMPRESSION: Left frontal lobe arteriovenous malformation. Primary arterial supply arises from the left anterior cerebral artery. Dominant venous drainage extends via a large tortuous vein that extends posteriorly and subsequently empties into the vein of Galen. Additional venous drainage extends superiorly and laterally into the superficial cortical venous system. Please see above report for a full description of these findings. Electronically Signed   By: BJeannine BogaM.D.   On: 09/19/2015 06:43   Ct Head Wo Contrast  09/19/2015  CLINICAL DATA:  Unresponsive impression with seizure like activity. EXAM: CT HEAD WITHOUT CONTRAST TECHNIQUE: Contiguous axial images were obtained from the base of the skull through the vertex without intravenous contrast. COMPARISON:  None. FINDINGS: There is a 2.7 x 2.0 cm high attenuating structure in the left frontal lobe which appears contiguous with  serpiginous and dilated vessels extending into the suprasellar region and extending back into the straight sinus. Findings likely represent a vascular malformation such AVM with draining vein into the straight sinus. Further evaluation with MRI/MRA. There is no associated mass effect or hemorrhage. The ventricles and sulci are appropriate in size for the patient's age. There is no intracranial hemorrhage. No mass effect or midline shift identified. The gray-white matter differentiation is preserved. There is no extra-axial fluid collection. There is mild mucoperiosteal thickening of the paranasal sinuses with partial opacification of the ethmoid air cells and opacification of the nasal passages. The mastoid air cells are clear. The calvarium is intact. An endotracheal and enteric tubes are partially visualized. IMPRESSION: No acute intracranial pathology. Vascular malformation in the left frontal lobe with draining vein into the straight sinus. Further evaluation with MRI/ MRA recommended. Electronically Signed    By: Anner Crete M.D.   On: 09/19/2015 05:14   Dg Chest Portable 1 View  09/19/2015  CLINICAL DATA:  Found unresponsive, endotracheal tube placement. EXAM: PORTABLE CHEST 1 VIEW COMPARISON:  None. FINDINGS: Endotracheal tube is 4.4 cm from the carina. Tip and side port of the enteric tube below the diaphragm, not included in the field of view. The cardiomediastinal contours are normal. The lungs are clear. Pulmonary vasculature is normal. No consolidation, pleural effusion, or pneumothorax. No acute osseous abnormalities are seen. Mild degenerative change about the right acromioclavicular joint. IMPRESSION: 1. Endotracheal tube 4.4 cm from the carina.  Enteric tube in place. 2.  No acute pulmonary process. Electronically Signed   By: Jeb Levering M.D.   On: 09/19/2015 03:44    Review of Systems  Unable to perform ROS: intubated   Blood pressure 133/87, pulse 79, temperature 99.6 F (37.6 C), temperature source Oral, resp. rate 23, height 5' 8" (1.727 m), weight 85 kg (187 lb 6.3 oz), SpO2 100 %. Physical Exam Intubated. Moves all 4 extremities to pain. pupis 46ms, E,R, R.  dtr 2 plus no babinski ct head shows an AVM on the anterior circulation. No evidence of bleeding. Assessment/Plan: Seizure control. Saw the case with dr NKathyrn Sheriff Once he is stable he will do a cerebral angiogram and proceed with embolization and/or surgery. He will see him in our office if the patient is discharge  BRipleyM 09/19/2015, 1:03 PM

## 2015-09-19 NOTE — Progress Notes (Signed)
Patient seen this am by Dr. Janann Colonel, please see his note for full details.    Patient had new onset seizure likely secondary to his large AVM. Continues to be very agitated when sedation is lightened. He has intact corneals and pupils with midline gaze. He is sedated currently due to agitation.   1) EEG 2) I will change keppra to depakote given agitation 3) Will need nsgy consult for AVM.   Isaac Rack, MD [facility] [phone]  If 7pm- 7am, please page neurology on call as listed in [place].

## 2015-09-19 NOTE — ED Notes (Signed)
Pt. currently at CT scan .  

## 2015-09-19 NOTE — Progress Notes (Signed)
RT increased RR to 22 and decreased FIO2 to 40% per ABG results. RT will continue to monitor

## 2015-09-19 NOTE — Progress Notes (Signed)
Patient ID: Isaac [name], unknown   DOB: 09/06/1875, 55 y.o.   MRN: [id number] Patient seen, note dictated. Once he is discharge he is to see dr Kathyrn Sheriff in our office for evaluation, cerebral angiogram and possible embolization and/or surgery

## 2015-09-19 NOTE — Progress Notes (Signed)
Bedside EEG completed, results pending. 

## 2015-09-19 NOTE — Procedures (Signed)
Extubation Procedure Note  Patient Details:   Name: Isaac [name] DOB: 09/06/1875 MRN: [id number]   Pt extubated to 4L [place] per MD order. Pt able to vocalize, no stridor noted, VS WNL. Pt tolerating well at this time, RT will continue to monitor.  Evaluation  O2 sats: stable throughout Complications: No apparent complications Patient did tolerate procedure well. Bilateral Breath Sounds: Diminished Suctioning: Oral, Airway Yes  Isaac [name] 09/19/2015, 1:18 PM

## 2015-09-19 NOTE — H&P (Signed)
Name: Cathlean Cower MRN: [id number] DOB: 09/06/1875    LOS:   Referring Provider:  EDP Reason for Referral:  Intubated after seizure activity  PULMONARY / CRITICAL CARE MEDICINE  HPI:  Isaac [name] is a unknown age male with history of drug abuse brought to [name] by EMS for seizure activity. History is obtained from chart review as patient with altered mental status/intubated and no family/friends present at time of examination. Per report, patient is currently a resident at a rehab house for cocaine abuse. His roommate woke up to patient making grunting noises and was unresponsive on EMS arrival. En route to ER, they noted brief seizure activity with left gaze deviation. He was combative and agitated in the ED and had a brief witnessed generalized tonic clonic seizure which resolved with 2 mg Ativan given once. His agitation continued, requiring intubation for safety. Neurology consulted and patient was loaded with Keppra.  Cannot obtain past medical, social, family history due to sedation/ventilator  Review Of Systems:  Unable to obtain due to altered mental status, sedated on vent  Events Since Admission: 1/13 >> GTC seizure in ED, increased agitation requiring intubation for safety  Current Status:  Vital Signs: Temp:  [98.9 F (37.2 C)] 98.9 F (37.2 C) (01/13 0300) Pulse Rate:  [86-166] 86 (01/13 0550) Resp:  [16-22] 18 (01/13 0550) BP: (134-257)/(83-166) 146/93 mmHg (01/13 0550) SpO2:  [97 %-100 %] 100 % (01/13 0550) FiO2 (%):  [40 %-60 %] 40 % (01/13 0340) Weight:  [187 lb 6.3 oz (85 kg)] 187 lb 6.3 oz (85 kg) (01/13 0340)  Physical Examination: General:  AAM, sedated on vent, mild agitation, well developed, well nourished Neuro:  Mild agitation, sedated on vent HEENT: normocephalic, PERRL Neck:  No carotid bruits Cardiovascular: tachycardic, regular rhythm, no m/r/g  Lungs: CTA bilaterally, vent assisted Abdomen: hypoactive bowel sounds, soft,  non-distended Musculoskeletal: no peripheral edema, moves upper extremities intermittently Skin:  No rash, open wounds  Vent Mode:  [-] PRVC FiO2 (%):  [40 %-60 %] 40 % Set Rate:  [15 bmp-22 bmp] 22 bmp Vt Set:  [550 mL] 550 mL PEEP:  [5 cmH20] 5 cmH20 Plateau Pressure:  [14 cmH20] 14 cmH20  Studies: 1/13 CXR: no consolidation, effusion, pneumothorax, or acute pulmonary process. ETT 4.4 cm from carina 1/13 CT Head: No acute intracranial pathology. Vascular malformation in the left frontal lobe with draining vein into the straight sinus.  1/13 CTA head  Cultures:  Antibiotics:    Principal Problem:   Seizures ([place])   ASSESSMENT AND PLAN  PULMONARY A: Intubation to protect airway Respiratory acidosis P:   Continue vent support, wean as tolerated SBTs when stable F/u ABG F/u lactic  CARDIOVASCULAR A: Sinus tachycardia HTN P:  Trend troponin Monitor blood pressure Cardiac monitoring  RENAL A: No acute issue P:   Monitor renal function and UOP Replace electrolytes as needed Check Mg and Phos  GASTROINTESTINAL A: GI ppx P:   Protonix NPO  HEMATOLOGIC A: DVT ppx P:  Lovenox  INFECTIOUS A: No acute infectious process P:   Continue to monitor clinically Check HIV  ENDOCRINE A: No acute issues   P:   Monitor serum glucose with chems  NEUROLOGIC A: Generalized tonic clonic seizure ?AVM left frontal lobe seen on CT head Hx of cocaine abuse P:   F/u CTA head Neuro following, appreciate assistance Continue Keppra 500 mg q12h per neuro MRI brain Fentanyl, Versed prn Propofol EEG Rapid drug screen negative  Isaac [name], M.D. Internal Medicine PGY1 Pager: [phone]  09/19/2015, 6:06 AM  Attending:  I have seen and examined the patient with nurse practitioner/resident and agree with the note above.  My edits are in BOLD above.  We formulated the plan together and I elicited the following history.    Mr. Isaac [name] was brought in  yesterday with seizure activity requiring intubation in the ED for status epilepticus.  Given ativan, loaded with Keppra.  Roommate called 911.  CT angiogram: AV malformation, primary arterial supply comes from left anterior cerebral artery.    No more seizure activity  On exam:  Sedated on vent Lungs clear, vent supported breaths CV WNL Belly soft, nontender  Status epilepticus> believed to be related to AVM off of left anterior cerebral artery, no more seizure activity; will keep on keppra for now, discuss disposition for surgery with NSGY Acute respiratory failure > due to inability to protect airway, continue ventilatory support for now until disposition on surgery determined Polysubstance abuse> need more history to help clarify  My cc time 35 minutes  Roselie Awkward, MD [facility] Pager: [phone] Cell: [phone] After 3pm or if no response, call [phone]

## 2015-09-19 NOTE — Care Management Note (Signed)
Case Management Note  Patient Details  Name: Isaac [name] MRN: [id number] Date of Birth: March 29, 1961  Subjective/Objective:    New seizures, From group home for drug abuse, SW consult placed                 Action/Plan:   Expected Discharge Date:                  Expected Discharge Plan:  Home/Self Care  In-House Referral:  Clinical Social Work  Discharge planning Services  CM Consult  Post Acute Care Choice:    Choice offered to:     DME Arranged:    DME Agency:     HH Arranged:    [place] Agency:     Status of Service:  In process, will continue to follow  Medicare Important Message Given:    Date Medicare IM Given:    Medicare IM give by:    Date Additional Medicare IM Given:    Additional Medicare Important Message give by:     If discussed at [place] of Stay Meetings, dates discussed:    Additional Comments:  Vergie Living, RN 09/19/2015, 2:02 PM

## 2015-09-19 NOTE — ED Notes (Signed)
Pt. arrived with EMS a "Rehab [facility]" , found responsive by roommate , brief seizure activity with left side gaze on the way to the ER ,  became increasingly agitated / combative at arrival . Given Ativan 2 mg IV .

## 2015-09-19 NOTE — Procedures (Signed)
ELECTROENCEPHALOGRAM REPORT  Date of Study: 09/19/2015  Patient's Name: Isaac [name] MRN: [id number] Date of Birth: 09/06/1875  Referring Provider: Dr. Michail Jewels  Clinical History: This is a ?? Year old man with history of drug abuse brought in for seizure. In the ED, he became increasingly agitated/combative and had a brief witnessed GTC seizure  Medications: fentaNYL (SUBLIMAZE) injection 100 mcg fentaNYL (SUBLIMAZE) injection 100 mcg propofol (DIPRIVAN) 1000 MG/100ML infusion valproate (DEPACON) 1,500 mg in dextrose 5 % 50 mL IVPB pantoprazole (PROTONIX) injection 40 mg  Technical Summary: A multichannel digital EEG recording measured by the international 10-20 system with electrodes applied with paste and impedances below 5000 ohms performed as portable with EKG monitoring in an intubated and sedated patient.  Hyperventilation and photic stimulation were not performed.  The digital EEG was referentially recorded, reformatted, and digitally filtered in a variety of bipolar and referential montages for optimal display.   Description: The patient is intubated and sedated on Propofol and Fentanyl during the recording.  There is no clear posterior dominant rhythm. The background consists of a moderate amount of diffuse theta and delta slowing, admixed with diffuse beta activity and poorly formed sleep spindles. With noxious stimulation, there is slight increase in faster frequencies. Hyperventilation and photic stimulation were not performed.  There were no epileptiform discharges or electrographic seizures seen.    EKG lead was unremarkable.  Impression: This is a normal sedated EEG.  Clinical Correlation: The absence of epileptiform discharges does not rule out a clinical diagnosis of epilepsy.  There were no electrographic seizures in this study. If clinical questions remain, a repeat wake EEG may be helpful. Clinical correlation is advised.   Ellouise Newer, M.D.

## 2015-09-19 NOTE — Consult Note (Signed)
Consult Reason for [place]  Referring Physician: Dr Dina Rich  CC: seizure  HPI: [name] is an 55 y.o. unknown with history of drug abuse brought in by EMS with likely seizure activity. Per report, patient is a resident of a rehab house for history of cocaine abuse, LSW is reportedly 2100 though unclear if accurate. Roommate woke up to patient making grunting noises, upon EMS arrival he was unresponsive, they did note brief seizure activity with left gaze deviation on way to ER. Upon arrival to ED became increasingly agitated/combative. Had brief witnessed GTC seizure in the ED, resolved with 2mg  of ativan x 1. Again became increasingly agitated requiring intubation for safety.   Due to altered mental status, unable to obtain further information in regards to past medical, surgical or social history.   History reviewed. No pertinent past medical history.  History reviewed. No pertinent past surgical history.  No family history on file.  Social History:  has no tobacco, alcohol, and drug history on file.  Allergies not on file  Medications: I have reviewed the patient's current medications.  ROS: Out of a complete 14 system review, the patient complains of only the following symptoms, and all other reviewed systems are negative. +seizure   Physical Examination: Filed Vitals:   09/19/15 0300 09/19/15 0312  BP: 160/100 160/100  Pulse: 166 128  Resp: 16 16   Physical Exam  Constitutional: He appears well-developed and well-nourished.  Psych: agitated, combative Eyes: No scleral injection HENT: No OP obstrucion Head: Normocephalic.  Cardiovascular: Normal rate and regular rhythm.  Respiratory: Effort normal and breath sounds normal.  GI: Soft. Bowel sounds are normal. No distension. There is no tenderness.  Skin: WDI  Neurologic Examination Mental Status: Eyes closed, no spontaneous eye opening. Very combative, not following commands, non-verbal Cranial  Nerves: II: unable to visualize optic discs, PERRL III,IV, VI: eyes closed, resists attempted eye opening. Eyes midline, no gaze deviation V,VII: smile symmetric, VIII: unable to test IX,X: gag reflex present XI: unable to test XII: unable to test Motor: Unable to formally test, appears to move all extremities symmetrically and against strong resistance Tone and bulk:normal tone throughout; no atrophy noted Sensory: responsive to noxious stimuli Deep Tendon Reflexes: Unable to test Plantars: Right: downgoing   Left: downgoing Cerebellar: Unable to test Gait: deferred  Laboratory Studies:   Basic Metabolic Panel: No results for input(s): NA, K, CL, CO2, GLUCOSE, BUN, CREATININE, CALCIUM, MG, PHOS in the last 168 hours.  Liver Function Tests: No results for input(s): AST, ALT, ALKPHOS, BILITOT, PROT, ALBUMIN in the last 168 hours. No results for input(s): LIPASE, AMYLASE in the last 168 hours. No results for input(s): AMMONIA in the last 168 hours.  CBC: No results for input(s): WBC, NEUTROABS, HGB, HCT, MCV, PLT in the last 168 hours.  Cardiac Enzymes: No results for input(s): CKTOTAL, CKMB, CKMBINDEX, TROPONINI in the last 168 hours.  BNP: Invalid input(s): POCBNP  CBG: No results for input(s): GLUCAP in the last 168 hours.  Microbiology: No results found for this or any previous visit.  Coagulation Studies: No results for input(s): LABPROT, INR in the last 72 hours.  Urinalysis: No results for input(s): COLORURINE, LABSPEC, PHURINE, GLUCOSEU, HGBUR, BILIRUBINUR, KETONESUR, PROTEINUR, UROBILINOGEN, NITRITE, LEUKOCYTESUR in the last 168 hours.  Invalid input(s): APPERANCEUR  Lipid Panel:  No results found for: CHOL, TRIG, HDL, CHOLHDL, VLDL, LDLCALC  HgbA1C: No results found for: HGBA1C  Urine Drug Screen:  No results found for: LABOPIA, COCAINSCRNUR, LABBENZ, AMPHETMU,  THCU, LABBARB  Alcohol Level: No results for input(s): ETH in the last 168 hours.  Other  results:  Imaging: No results found.   Assessment/Plan:  Patient with a history of cocaine abuse, and HTN, currently in a rehab house admitted with seizure activity and altered mental status. Intubated in ED secondary to severe agitation. History and exam limited due to agitation. No known seizure history. Will be admitted to ICU for further workup.   -CBC, CMP, UDS -stat CT head -EEG -seizure precautions -keppra 1000mg  IV in the ED. Start keppra 500mg  Q12hrs    Jim Like, DO [facility] [phone]  If 7pm- 7am, please page neurology on call as listed in [place]. 09/19/2015, 3:26 AM

## 2015-09-19 NOTE — Progress Notes (Signed)
RT assisted ED physician with intubation. ETCO2 positive color change. Bilateral breath sounds present. CXR and ABG pending. RT will continue to monitor.

## 2015-09-19 NOTE — ED Provider Notes (Signed)
CSN: [id number]     Arrival date & time 09/19/15  [id number] History  By signing my name below, I, Arianna Nassar, attest that this documentation has been prepared under the direction and in the presence of Merryl Hacker, MD. Electronically Signed: Julien Nordmann, ED Scribe. 09/19/2015. 3:16 AM.    No chief complaint on file.    LEVEL 5 CAVEAT. PT IS UNRESPONSIVE. HPI HPI Comments: [name] is a 55 y.o. unknown brought in by ambulance, who presents to the Emergency Department unresponsive. Per [facility], pt was found unresponsive in his home PTA. He currently lives in a rehab halfway house for a hx of cocaine abuse. According to his housemates he was seen normal around 9 pm. He woke his roommates up with grunting noises. EMS says pt was unresponsive to commands and was only able to look left and not right. He has a hx of HTN but it is unknown if pt is compliant with any medications. They state pt changed positions and turned to right with grand mal seizure like activity resulting in tongue trauma during seizure like activity. He seized for about 1 minute in front of EMS personnel and he had response to painful stimuli.  Tachy 120, CBG 149 PTA  Patient appears to move all 4 extremities. Extremely combative and nonresponsive to commands. He had 115 second episode of right upper extremity twitching and biting down on his jaw. Consistent with recurrent seizure activity. He was given 2 mg of Ativan.   History reviewed. No pertinent past medical history. History reviewed. No pertinent past surgical history. No family history on file. Social History  Substance Use Topics  . Smoking status: None  . Smokeless tobacco: None  . Alcohol Use: None   OB History    No data available     Review of Systems  Unable to perform ROS: Mental status change    LEVEL 5 CAVEAT. PT IS UNRESPONSIVE  Allergies  Review of patient's allergies indicates not on file.  Home Medications   Prior to Admission  medications   Not on File   Triage vitals:BP 160/100 mmHg  Pulse 128  Resp 16  Ht 5\' 8"  (1.727 m)  SpO2 100% Physical Exam  Constitutional:  Alert but combative,not responding to commands  HENT:  Head: Normocephalic.  Dry blood about the mouth  Eyes:  pupils 4 mm reactive bilaterally  Cardiovascular:  tachycardia  Pulmonary/Chest: Effort normal and breath sounds normal. No respiratory distress.  Abdominal: Soft. Bowel sounds are normal. There is no tenderness. There is no rebound.  Musculoskeletal: He exhibits no edema.  Neurological: GCS eye subscore is 4. GCS verbal subscore is 2. GCS motor subscore is 4.  Skin: Skin is dry.  Nursing note and vitals reviewed.   ED Course  Procedures   CRITICAL CARE Performed by: Merryl Hacker   Total critical care time: 60 minutes  Critical care time was exclusive of separately billable procedures and treating other patients.  Critical care was necessary to treat or prevent imminent or life-threatening deterioration.  Critical care was time spent personally by me on the following activities: development of treatment plan with patient and/or surrogate as well as nursing, discussions with consultants, evaluation of patient's response to treatment, examination of patient, obtaining history from patient or surrogate, ordering and performing treatments and interventions, ordering and review of laboratory studies, ordering and review of radiographic studies, pulse oximetry and re-evaluation of patient's condition.  INTUBATION Performed by: Merryl Hacker  Required  items: required blood products, implants, devices, and special equipment available Patient identity confirmed: provided demographic data and hospital-assigned identification number Time out: Immediately prior to procedure a "time out" was called to verify the correct patient, procedure, equipment, support staff and site/side marked as required.  Indications: combative,  recurrent seizure activity  Intubation method: Glidescope Laryngoscopy   Preoxygenation: BVM  Sedatives: Etomidate Paralytic: Rocuronium  Tube Size: 7.5 cuffed  Post-procedure assessment: chest rise and ETCO2 monitor Breath sounds: equal and absent over the epigastrium Tube secured with: ETT holder Chest x-ray interpreted by radiologist and me.  Chest x-ray findings: endotracheal tube in appropriate position  Patient tolerated the procedure well with no immediate complications.     COORDINATION OF CARE:  3:10 AM Discussed treatment plan which includes ativan IM and intubate at bedside.  Labs Review Labs Reviewed  COMPREHENSIVE METABOLIC PANEL - Abnormal; Notable for the following:    Glucose, Bld 156 (*)    AST 47 (*)    All other components within normal limits  URINALYSIS, ROUTINE W REFLEX MICROSCOPIC (NOT AT [facility]) - Abnormal; Notable for the following:    Glucose, UA 100 (*)    Hgb urine dipstick SMALL (*)    Protein, ur 30 (*)    All other components within normal limits  URINE MICROSCOPIC-ADD ON - Abnormal; Notable for the following:    Squamous Epithelial / LPF 0-5 (*)    Bacteria, UA RARE (*)    All other components within normal limits  PHOSPHORUS - Abnormal; Notable for the following:    Phosphorus 2.3 (*)    All other components within normal limits  TROPONIN I - Abnormal; Notable for the following:    Troponin I 0.06 (*)    All other components within normal limits  I-STAT ARTERIAL BLOOD GAS, ED - Abnormal; Notable for the following:    pH, Arterial 7.144 (*)    pCO2 arterial 55.2 (*)    pO2, Arterial 203.0 (*)    Bicarbonate 18.9 (*)    Acid-base deficit 10.0 (*)    All other components within normal limits  I-STAT CG4 LACTIC ACID, ED - Abnormal; Notable for the following:    Lactic Acid, Venous 2.25 (*)    All other components within normal limits  I-STAT ARTERIAL BLOOD GAS, ED - Abnormal; Notable for the following:    pH, Arterial 7.347 (*)     pO2, Arterial 132.0 (*)    All other components within normal limits  CBC WITH DIFFERENTIAL/PLATELET  URINE RAPID DRUG SCREEN, HOSP PERFORMED  ETHANOL  MAGNESIUM  BLOOD GAS, ARTERIAL  TSH  HIV ANTIBODY (ROUTINE TESTING)  TROPONIN I  TROPONIN I    Imaging Review Ct Angio Head W/cm &/or Wo Cm  09/19/2015  CLINICAL DATA:  Follow-up abnormal head CT. EXAM: CT ANGIOGRAPHY HEAD TECHNIQUE: Multidetector CT imaging of the head was performed using the standard protocol during bolus administration of intravenous contrast. Multiplanar CT image reconstructions and MIPs were obtained to evaluate the vascular anatomy. CONTRAST:  132mL OMNIPAQUE IOHEXOL 350 MG/ML SOLN COMPARISON:  Prior CT from earlier the same day. FINDINGS: CTA HEAD Anterior circulation: Visualized distal cervical segments of the internal carotid arteries are widely patent and well opacified. Petrous, cavernous, and supraclinoid ICAs are widely patent. M1 segments widely patent. MCA bifurcations within normal limits. Distal MCA branches symmetric without abnormality. A1 segments symmetric and well opacified. Anterior communicating artery somewhat tortuous but otherwise unremarkable. Right ACA is well opacified to its distal aspect. The left  anterior cerebral artery is enlarged relative to the contralateral right A2 segment as it appears to be the main arterial supply for previously identified arteriovenous malformation. AVM is positioned in the parasagittal inferior left frontal lobe, and measures approximately 2.9 x 2.5 x 3.1 cm in size. The AVM itself consists of multiple abnormal tortuous serpiginous vessels clustered together. No obvious flow related arterial aneurysm, although evaluation somewhat limited. AVM may involve the anterior genu and rostrum of the corpus callosum which are positioned along the posterior margin of the lesion. There is a prominent venous pouch along the left lateral margin of the AVM that measures approximately 18 x  14 x 12 mm (series 8, image 61). The primary venous drainage occurs via an enlarged tortuous vein that extends posteriorly through the mesial left temporal lobe and into the vein of Galen, just prior to the confluence with the straight sinus. There is additional venous drainage superiorly into the superficial cortical venous system just to the left of the anterior superior sagittal sinus (series 11, image 85). Prominent draining vein extends posteriorly within the mesial left frontal lobe as well (series 5, image 69). Additional lateral venous drainage through the anterior/mid left temporal lobe, which may reflect the involved vein of Labbe. This venous drainage then extends posteriorly along the surface of the left temporal lobe towards the left transverse sinus. The main venous sinuses themselves including the superior sagittal sinus transverse sinuses and sigmoid sinuses are patent without evidence for venous sinus thrombosis. Posterior circulation: Right vertebral artery is dominant and widely patent to the vertebrobasilar junction. Left vertebral artery is markedly diminutive and terminates in PICA. Posterior inferior cerebral arteries themselves are patent. Tiny 1-2 mm focal outpouching at the vertebrobasilar junction favored to reflect a small infundibulum (series 6, image 124). Basilar artery widely patent. Dominant right anterior inferior cerebral artery noted. Superior cerebellar arteries patent bilaterally. Both posterior cerebral arteries arise from the basilar artery and are well opacified to their distal aspects. Small bilateral posterior communicating arteries noted. Delayed phase:Enhancement of the left frontal AVM. No other abnormal enhancement. IMPRESSION: Left frontal lobe arteriovenous malformation. Primary arterial supply arises from the left anterior cerebral artery. Dominant venous drainage extends via a large tortuous vein that extends posteriorly and subsequently empties into the vein of  Galen. Additional venous drainage extends superiorly and laterally into the superficial cortical venous system. Please see above report for a full description of these findings. Electronically Signed   By: Jeannine Boga M.D.   On: 09/19/2015 06:43   Ct Head Wo Contrast  09/19/2015  CLINICAL DATA:  Unresponsive impression with seizure like activity. EXAM: CT HEAD WITHOUT CONTRAST TECHNIQUE: Contiguous axial images were obtained from the base of the skull through the vertex without intravenous contrast. COMPARISON:  None. FINDINGS: There is a 2.7 x 2.0 cm high attenuating structure in the left frontal lobe which appears contiguous with serpiginous and dilated vessels extending into the suprasellar region and extending back into the straight sinus. Findings likely represent a vascular malformation such AVM with draining vein into the straight sinus. Further evaluation with MRI/MRA. There is no associated mass effect or hemorrhage. The ventricles and sulci are appropriate in size for the patient's age. There is no intracranial hemorrhage. No mass effect or midline shift identified. The gray-white matter differentiation is preserved. There is no extra-axial fluid collection. There is mild mucoperiosteal thickening of the paranasal sinuses with partial opacification of the ethmoid air cells and opacification of the nasal passages. The mastoid air  cells are clear. The calvarium is intact. An endotracheal and enteric tubes are partially visualized. IMPRESSION: No acute intracranial pathology. Vascular malformation in the left frontal lobe with draining vein into the straight sinus. Further evaluation with MRI/ MRA recommended. Electronically Signed   By: Anner Crete M.D.   On: 09/19/2015 05:14   Dg Chest Portable 1 View  09/19/2015  CLINICAL DATA:  Found unresponsive, endotracheal tube placement. EXAM: PORTABLE CHEST 1 VIEW COMPARISON:  None. FINDINGS: Endotracheal tube is 4.4 cm from the carina. Tip and  side port of the enteric tube below the diaphragm, not included in the field of view. The cardiomediastinal contours are normal. The lungs are clear. Pulmonary vasculature is normal. No consolidation, pleural effusion, or pneumothorax. No acute osseous abnormalities are seen. Mild degenerative change about the right acromioclavicular joint. IMPRESSION: 1. Endotracheal tube 4.4 cm from the carina.  Enteric tube in place. 2.  No acute pulmonary process. Electronically Signed   By: Jeb Levering M.D.   On: 09/19/2015 03:44   I have personally reviewed and evaluated these images and lab results as part of my medical decision-making.   EKG Interpretation   Date/Time:  Friday September 19 2015 03:10:57 EST Ventricular Rate:  132 PR Interval:  77 QRS Duration: 93 QT Interval:  317 QTC Calculation: 470 R Axis:   75 Text Interpretation:  Sinus tachycardia LAE, consider biatrial enlargement  Repol abnrm suggests ischemia, anterolateral Baseline wander in lead(s) V2  Partial missing lead(s): V2 Confirmed by Megan Presti  MD, Rajat Staver ([id number]) on  09/19/2015 6:07:25 AM      MDM   Final diagnoses:  Seizure ([place])  AVM (arteriovenous malformation) brain   Patient presents with seizures.Witnessed seizure activity. He appears post ictal and is very combative. I urgently consult to neurology who evaluated the patient prior to intubation. Patient required intubation for safety and in order to obtain CT scan. He is placed on a propofol drip.Critical care was consulted for admission. I was called from the CT scanner. He has an abnormality on his CT and CTA was added. It appears he has had AVM. Critical care was updated.  He was loaded with a gram of IV Keppra per neurology.  I personally performed the services described in this documentation, which was scribed in my presence. The recorded information has been reviewed and is accurate.   Merryl Hacker, MD 09/19/15 [id number]

## 2015-09-20 DIAGNOSIS — J9601 Acute respiratory failure with hypoxia: Secondary | ICD-10-CM | POA: Diagnosis present

## 2015-09-20 LAB — CBC
HEMATOCRIT: 37.4 % — AB (ref 39.0–52.0)
Hemoglobin: 12.1 g/dL — ABNORMAL LOW (ref 13.0–17.0)
MCH: 28.1 pg (ref 26.0–34.0)
MCHC: 32.4 g/dL (ref 30.0–36.0)
MCV: 86.8 fL (ref 78.0–100.0)
Platelets: 222 10*3/uL (ref 150–400)
RBC: 4.31 MIL/uL (ref 4.22–5.81)
RDW: 12.2 % (ref 11.5–15.5)
WBC: 7.9 10*3/uL (ref 4.0–10.5)

## 2015-09-20 LAB — BASIC METABOLIC PANEL
Anion gap: 8 (ref 5–15)
BUN: 13 mg/dL (ref 6–20)
CHLORIDE: 108 mmol/L (ref 101–111)
CO2: 25 mmol/L (ref 22–32)
Calcium: 8.3 mg/dL — ABNORMAL LOW (ref 8.9–10.3)
Creatinine, Ser: 1.16 mg/dL (ref 0.61–1.24)
GFR calc Af Amer: >60 mL/min (ref >60)
GFR calc non Af Amer: >60 mL/min (ref >60)
GLUCOSE: 91 mg/dL (ref 65–99)
POTASSIUM: 3.6 mmol/L (ref 3.5–5.1)
SODIUM: 141 mmol/L (ref 135–145)

## 2015-09-20 LAB — GLUCOSE, CAPILLARY
GLUCOSE-CAPILLARY: 87 mg/dL (ref 65–99)
Glucose-Capillary: 84 mg/dL (ref 65–99)

## 2015-09-20 MED ORDER — PANTOPRAZOLE SODIUM 40 MG PO TBEC
40.0000 mg | DELAYED_RELEASE_TABLET | Freq: Every day | ORAL | Status: DC
  Administered 2015-09-20: 40 mg via ORAL
  Filled 2015-09-20: qty 1

## 2015-09-20 MED ORDER — LEVETIRACETAM 750 MG PO TABS
750.0000 mg | ORAL_TABLET | Freq: Two times a day (BID) | ORAL | Status: DC
  Administered 2015-09-20 – 2015-09-21 (×3): 750 mg via ORAL
  Filled 2015-09-20 (×4): qty 1

## 2015-09-20 NOTE — Progress Notes (Signed)
Patient ID: Isaac [name], male   DOB: 05/14/1961, 55 y.o.   MRN: [id number] If he is to be discharge , please to call dr Carroll Kinds office for a f/u. Spoke with his neurologist

## 2015-09-20 NOTE — Progress Notes (Signed)
Subjective: Patient extubated, feels back to baseline.   Once more information was gained, he has a history of unprovoked seizures at home, takes keppra at home. Most recent seizure was 2 months ago.   Exam: Filed Vitals:   09/20/15 0600 09/20/15 0700  BP: 109/65 139/87  Pulse: 73 69  Temp:    Resp: 14 15   Gen: In bed, NAD Resp: non-labored breathing, no acute distress Abd: soft, nt  Neuro: MS: Awake, alert, interactive and appropriate [id number], EOMI Motor: MAEW Sensory:intact to LT  Pertinent Labs: BMP - unremarkable  Impression: 55 yo M with new onset seizures. Valproate can interact with his antivirals and he was tolerating keppra well at home. I will put him back on keppra 750mg  BID(increased from home 500mg  BID).   Recommendations: 1) Now that he is back to baseline, ok for D/C from neuro perspective as long as he does ok with getting up and around, I have asked nursing to ambulate him.  2) Continue keppra 750mg  BID.  3) I will request outpatient neurology follow up.  4) can follow up with Dr. Kathyrn Sheriff as outpatient.  5) Please call with any further questions or concerns.   Roland Rack, MD [facility] [phone]  If 7pm- 7am, please page neurology on call as listed in [place].

## 2015-09-20 NOTE — H&P (Signed)
Name: Isaac [name] MRN: [id number] DOB: Mar 24, 1961    LOS:   Referring Provider:  EDP Reason for Referral:  Intubated after seizure activity  PULMONARY / CRITICAL CARE MEDICINE  HPI:  Pt is 21 with history of drug abuse brought to [name] ED by EMS for seizure activity. History was obtained from chart review as patient with altered mental status/intubated and no family/friends present at time of examination. Per report, patient is currently a resident at a rehab house for cocaine abuse. His roommate woke up to patient making grunting noises and was unresponsive on EMS arrival. En route to ER, they noted brief seizure activity with left gaze deviation. He was combative and agitated in the ED and had a brief witnessed generalized tonic clonic seizure which resolved with 2 mg Ativan given once. His agitation continued, requiring intubation for safety. Neurology consulted and patient was loaded with Keppra.  New information > pt has hx seizure d/o, was on keppra 500 bid at home.   Review Of Systems:  Unable to obtain due to altered mental status, sedated on vent  Events Since Admission: 1/13 >> GTC seizure in ED, increased agitation requiring intubation for safety  Current Status:  Vital Signs: Temp:  [98.8 F (37.1 C)-99.8 F (37.7 C)] 99.6 F (37.6 C) (01/14 1204) Pulse Rate:  [69-133] 86 (01/14 1042) Resp:  [12-26] 17 (01/14 1042) BP: (107-150)/(65-97) 117/76 mmHg (01/14 1042) SpO2:  [96 %-100 %] 100 % (01/14 1042) Weight:  [77.2 kg (170 lb 3.1 oz)] 77.2 kg (170 lb 3.1 oz) (01/14 0500)  Physical Examination: General:  AAM, awake and appropriate Neuro:  A&Ox3, nonj-focal HEENT: normocephalic, PERRL Neck:  No carotid bruits Cardiovascular: tachycardic, regular rhythm, no m/r/g  Lungs: CTA bilaterally, vent assisted Abdomen: hypoactive bowel sounds, soft, non-distended Musculoskeletal: no peripheral edema, moves upper extremities intermittently Skin:  No rash, open wounds      Studies: 1/13 CXR: no consolidation, effusion, pneumothorax, or acute pulmonary process. ETT 4.4 cm from carina 1/13 CT Head: No acute intracranial pathology. Vascular malformation in the left frontal lobe with draining vein into the straight sinus.  1/13 CTA head  Cultures:  Antibiotics:    Principal Problem:   Seizures ([name])   ASSESSMENT AND PLAN  PULMONARY A: Intubation to protect airway, now extubated Respiratory acidosis P:   pulm hygiene, no current issues  CARDIOVASCULAR A: Sinus tachycardia HTN P:  Restart home lisinopril - HCTZ  RENAL A: No acute issue P:   Monitor renal function and UOP Replace electrolytes as needed  GASTROINTESTINAL A: GI ppx P:   Protonix Diet ordered  HEMATOLOGIC A: DVT ppx P:  Lovenox  INFECTIOUS A: No acute infectious process P:   Continue to monitor clinically Follow HIV results  ENDOCRINE A: No acute issues   P:   Monitor serum glucose with chems  NEUROLOGIC A: Generalized tonic clonic seizure ?AVM left frontal lobe seen on CT head Hx of cocaine abuse P:   F/u CTA head Neuro following, appreciate assistance Keppra increased to 750mg  bid (home dose was 500 bid)  Can go to floor bed, possibly home today  Isaac Apo, MD, PhD 09/20/2015, 12:10 PM [facility] [phone] or if no answer [phone]

## 2015-09-21 ENCOUNTER — Encounter (HOSPITAL_COMMUNITY): Payer: Self-pay | Admitting: Internal Medicine

## 2015-09-21 DIAGNOSIS — Z21 Asymptomatic human immunodeficiency virus [HIV] infection status: Secondary | ICD-10-CM | POA: Diagnosis present

## 2015-09-21 DIAGNOSIS — B20 Human immunodeficiency virus [HIV] disease: Secondary | ICD-10-CM | POA: Diagnosis present

## 2015-09-21 DIAGNOSIS — I10 Essential (primary) hypertension: Secondary | ICD-10-CM | POA: Diagnosis present

## 2015-09-21 DIAGNOSIS — B1920 Unspecified viral hepatitis C without hepatic coma: Secondary | ICD-10-CM | POA: Diagnosis present

## 2015-09-21 LAB — HCV RNA NS5A DRUG RESISTANCE

## 2015-09-21 MED ORDER — LEVETIRACETAM 750 MG PO TABS
750.0000 mg | ORAL_TABLET | Freq: Two times a day (BID) | ORAL | Status: DC
Start: 2015-09-21 — End: 2015-11-11

## 2015-09-21 NOTE — Final Progress Note (Signed)
Patient discharged to [facility]. Discharge instructions provided for patient and reviewed. Special emphasis given to the need to follow up with neurological MD and neurosurgery MD. Phone numbers and addresses provided. Patient indicates understanding and assures follow-up.

## 2015-09-21 NOTE — Discharge Summary (Signed)
Name: Isaac [name] MRN: [id number] DOB: 06/08/1961 55 y.o. PCP: No primary care provider on file.  Date of Admission: 09/19/2015  2:52 AM Date of Discharge: 09/21/2015 Attending Physician: Dr. 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     levETIRAcetam 750 MG tablet  Commonly known as:  KEPPRA  Take 1 tablet (750 mg total) by mouth 2 (two) times daily.     lisinopril-hydrochlorothiazide 10-12.5 MG tablet  Commonly known as:  PRINZIDE,ZESTORETIC  Take 1 tablet by mouth daily.     TIVICAY 50 MG tablet  Generic drug:  dolutegravir  Take 50 mg by mouth daily.        Disposition and follow-up:   Mr.Isaac [name] was discharged from [facility] in Good condition.  At the hospital follow up visit please address:  1. Left frontal lobe AVM and seizures - needs to follow-up with neurology and neurosurgery, both contacts given to patient  Keppra was increased from 500 mg BID to 750 mg BID - ensure compliance   Hepatitis C infection  - Follows with Dr. Johnnye Sima, to start harvoni soon  2.  Labs / imaging needed at time of follow-up: Angiography for possible embolization +/- surgery  3.  Pending labs/ test needing follow-up: HIV Ab (however already has known HIV)  Follow-up Appointments:     Follow-up Information    Schedule an appointment as soon as possible for a visit with NUNDKUMAR, Bradley Ferris, MD.   Specialty:  Neurosurgery   Contact information:   [address] [place] [address] [phone]       Follow up with [facility] [place].   Specialty:  Neurology   Why:  will call you with an appt   Contact information:   [address], [address] [place] [phone]      Discharge Instructions: Discharge Instructions    Ambulatory referral to Neurology    Complete by:  As directed   An appointment is requested in approximately: 4 weeks           Consultations: Treatment Team:  Leeroy Cha, MD  Procedures Performed:  Ct Angio Head W/cm &/or Wo Cm  09/19/2015  CLINICAL DATA:  Follow-up abnormal head CT. EXAM: CT ANGIOGRAPHY HEAD TECHNIQUE: Multidetector CT imaging of the head was performed using the standard protocol during bolus administration of intravenous contrast. Multiplanar CT image reconstructions and MIPs were obtained to evaluate the vascular anatomy. CONTRAST:  150mL OMNIPAQUE IOHEXOL 350 MG/ML SOLN COMPARISON:  Prior CT from earlier the same day. FINDINGS: CTA HEAD Anterior circulation: Visualized distal cervical segments of the internal carotid arteries are widely patent and well opacified. Petrous, cavernous, and supraclinoid ICAs are widely patent. M1 segments widely patent. MCA bifurcations within normal limits. Distal MCA branches symmetric without abnormality. A1 segments symmetric and well opacified. Anterior communicating artery somewhat tortuous but otherwise unremarkable. Right ACA is well opacified to its distal aspect. The left anterior cerebral artery is enlarged relative to the contralateral right A2 segment as it appears to be the main arterial supply for previously identified arteriovenous malformation. AVM is positioned in the parasagittal inferior  left frontal lobe, and measures approximately 2.9 x 2.5 x 3.1 cm in size. The AVM itself consists of multiple abnormal tortuous serpiginous vessels clustered together. No obvious flow related arterial aneurysm, although evaluation somewhat limited. AVM may involve the anterior genu and rostrum of the corpus callosum which are positioned along the posterior margin of the lesion. There is a prominent  venous pouch along the left lateral margin of the AVM that measures approximately 18 x 14 x 12 mm (series 8, image 61). The primary venous drainage occurs via an enlarged tortuous vein that extends posteriorly through the mesial left temporal lobe and into the vein of Galen, just prior to the confluence with the straight sinus. There is additional venous drainage superiorly into the superficial cortical venous system just to the left of the anterior superior sagittal sinus (series 11, image 85). Prominent draining vein extends posteriorly within the mesial left frontal lobe as well (series 5, image 69). Additional lateral venous drainage through the anterior/mid left temporal lobe, which may reflect the involved vein of Labbe. This venous drainage then extends posteriorly along the surface of the left temporal lobe towards the left transverse sinus. The main venous sinuses themselves including the superior sagittal sinus transverse sinuses and sigmoid sinuses are patent without evidence for venous sinus thrombosis. Posterior circulation: Right vertebral artery is dominant and widely patent to the vertebrobasilar junction. Left vertebral artery is markedly diminutive and terminates in PICA. Posterior inferior cerebral arteries themselves are patent. Tiny 1-2 mm focal outpouching at the vertebrobasilar junction favored to reflect a small infundibulum (series 6, image 124). Basilar artery widely patent. Dominant right anterior inferior cerebral artery noted. Superior cerebellar arteries patent bilaterally. Both posterior cerebral arteries arise from the basilar artery and are well opacified to their distal aspects. Small bilateral posterior communicating arteries noted. Delayed phase:Enhancement of the left frontal AVM. No other abnormal enhancement. IMPRESSION: Left frontal lobe arteriovenous malformation. Primary arterial supply arises from the left anterior cerebral artery. Dominant venous drainage extends via a  large tortuous vein that extends posteriorly and subsequently empties into the vein of Galen. Additional venous drainage extends superiorly and laterally into the superficial cortical venous system. Please see above report for a full description of these findings. Electronically Signed   By: Jeannine Boga M.D.   On: 09/19/2015 06:43   Ct Head Wo Contrast  09/19/2015  CLINICAL DATA:  Unresponsive impression with seizure like activity. EXAM: CT HEAD WITHOUT CONTRAST TECHNIQUE: Contiguous axial images were obtained from the base of the skull through the vertex without intravenous contrast. COMPARISON:  None. FINDINGS: There is a 2.7 x 2.0 cm high attenuating structure in the left frontal lobe which appears contiguous with serpiginous and dilated vessels extending into the suprasellar region and extending back into the straight sinus. Findings likely represent a vascular malformation such AVM with draining vein into the straight sinus. Further evaluation with MRI/MRA. There is no associated mass effect or hemorrhage. The ventricles and sulci are appropriate in size for the patient's age. There is no intracranial hemorrhage. No mass effect or midline shift identified. The gray-white matter differentiation is preserved. There is no extra-axial fluid collection. There is mild mucoperiosteal thickening of the paranasal sinuses with partial opacification of the ethmoid air cells and opacification of the nasal passages. The mastoid air cells are clear. The calvarium is intact. An endotracheal and enteric tubes are partially visualized. IMPRESSION: No acute intracranial pathology. Vascular malformation in the left frontal lobe with draining vein into the straight  sinus. Further evaluation with MRI/ MRA recommended. Electronically Signed   By: Anner Crete M.D.   On: 09/19/2015 05:14   Dg Chest Portable 1 View  09/19/2015  CLINICAL DATA:  Found unresponsive, endotracheal tube placement. EXAM: PORTABLE CHEST 1  VIEW COMPARISON:  None. FINDINGS: Endotracheal tube is 4.4 cm from the carina. Tip and side port of the enteric tube below the diaphragm, not included in the field of view. The cardiomediastinal contours are normal. The lungs are clear. Pulmonary vasculature is normal. No consolidation, pleural effusion, or pneumothorax. No acute osseous abnormalities are seen. Mild degenerative change about the right acromioclavicular joint. IMPRESSION: 1. Endotracheal tube 4.4 cm from the carina.  Enteric tube in place. 2.  No acute pulmonary process. Electronically Signed   By: Jeb Levering M.D.   On: 09/19/2015 03:44      Admission HPI: By Zada Finders MD  [place] B Doe is a unknown age male with history of drug abuse brought to [name] by EMS for seizure activity. History is obtained from chart review as patient with altered mental status/intubated and no family/friends present at time of examination. Per report, patient is currently a resident at a rehab house for cocaine abuse. His roommate woke up to patient making grunting noises and was unresponsive on EMS arrival. En route to ER, they noted brief seizure activity with left gaze deviation. He was combative and agitated in the ED and had a brief witnessed generalized tonic clonic seizure which resolved with 2 mg Ativan given once. His agitation continued, requiring intubation for safety. Neurology consulted and patient was loaded with Keppra.  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 He was unresponsiveness on EMS arrival and had witnessed tonic-clonic seizure that resolved with ativan administration. He required mechanical intubation for airway protection and was subsequently extubated the same day. He was seen by neurology who  loaded him with keppra and then changed him to depakote in setting of agitation while intubated. It was found out that he has a history of seizure disorder of unknown etiology which he is compliant with taking keppra 500 mg BID at home and last seizure approximately 2 months ago. He had CT head on 09/19/15 which revealed vascular malformation in the left frontal lobe with draining vein into the straight sinus. Subsequent CTA head on 113/17 revealed left frontal lobe AVM measuring 2.9 x 2.5 x 3.1 cm in size. Per neurosurgery no emergent intervention was recommended in the absence of bleeding. He is to follow-up with Dr. Carroll Kinds for cerebral angiography with possible embolization +/- surgery. Neurology changed his depakote back to keppra but increased the dosage to 750 mg BID which he is to continue on discharge. Bedside EEG revealed normal sedated EEG with no epileptiform discharges. UDS and ethanol levels were negative. TSH was normal. He did not have any further seizure activity during hospitalization and was stable for discharge back to rehab house.  HIV Infection - Pt with known HIV infection with reported compliance with home tivicay and descovy which were continued during hospitalization. Pt follows with Dr. Johnnye Sima.   Hepatitis C Infection - Pt found to have positive hepatitis C antibody. Pt stated he has chronic hepatitis C infection and is to start treatment soon with harvoni. He follows with Dr. Johnnye Sima.  Hypertension - Pt received home lisinopril-HCTZ once able to tolerate PO intake with blood pressure range from 107/69 to 201/123 during hospitalization.  DVT Prophylaxis - Pt received lovenox during hospitalization with no evidence of DVT.    Discharge Vitals:   BP 128/92 mmHg  Pulse 71  Temp(Src) 99.3 F (37.4 C) (Oral)  Resp 18  Ht 5\' 9"  (1.753 m)  Wt 148 lb 9.4 oz (67.4 kg)  BMI 21.93 kg/m2  SpO2 100%  Discharge Labs:  No results found for this or any previous visit (from the  past 24 hour(s)).  Signed: Juluis Mire, MD 09/21/2015, 2:47 PM    Services Ordered on Discharge: None Equipment Ordered on Discharge: None   Attending Note:  I have examined patient, reviewed labs, studies and notes. I have discussed the case with Dr Naaman Plummer, and I agree with the data and plans as amended above.  Baltazar Apo, MD, PhD 09/21/2015, 3:33 PM [facility] [phone] or if no answer [phone]

## 2015-09-21 NOTE — Discharge Instructions (Signed)
-  Start taking keppra 750 mg twice a day instead of 500 mg twice a day for your seizures, I sent this to your pharmacy  -Keep taking your other medications -You will need to follow-up with neurology (they should be calling you) and neurosurgery (please call them), the numbers are listed in the follow-up section -Pleasure meeting you, hope you were satisfied with our care

## 2015-09-23 LAB — HLA B*5701: HLA-B 5701 W/RFLX HLA-B HIGH: NEGATIVE

## 2015-09-27 ENCOUNTER — Other Ambulatory Visit: Payer: Self-pay | Admitting: Infectious Disease

## 2015-10-02 LAB — HIV 1/2 AB DIFFERENTIATION
HIV 1 Ab: POSITIVE — AB
HIV 2 Ab: NEGATIVE

## 2015-10-02 LAB — HIV ANTIBODY (ROUTINE TESTING W REFLEX)

## 2015-10-06 LAB — HEPATITIS C ANTIBODY (REFLEX): HCV Ab: >11 s/co ratio — ABNORMAL HIGH (ref 0.0–0.9)

## 2015-10-06 LAB — COMMENT2 - HEP PANEL

## 2015-10-08 ENCOUNTER — Encounter: Payer: Self-pay | Admitting: Pharmacy Technician

## 2015-10-29 ENCOUNTER — Other Ambulatory Visit: Payer: Self-pay | Admitting: Pharmacist Clinician (PhC)/ Clinical Pharmacy Specialist

## 2015-10-29 DIAGNOSIS — B182 Chronic viral hepatitis C: Secondary | ICD-10-CM

## 2015-11-01 ENCOUNTER — Other Ambulatory Visit: Payer: Self-pay | Admitting: Internal Medicine

## 2015-11-04 ENCOUNTER — Other Ambulatory Visit: Payer: Self-pay

## 2015-11-06 ENCOUNTER — Other Ambulatory Visit: Payer: Self-pay | Admitting: *Deleted

## 2015-11-06 ENCOUNTER — Other Ambulatory Visit: Payer: Self-pay | Admitting: Infectious Diseases

## 2015-11-06 DIAGNOSIS — R569 Unspecified convulsions: Secondary | ICD-10-CM

## 2015-11-07 ENCOUNTER — Encounter: Payer: Self-pay | Admitting: Infectious Diseases

## 2015-11-10 ENCOUNTER — Other Ambulatory Visit: Payer: Self-pay | Admitting: Infectious Diseases

## 2015-11-11 ENCOUNTER — Other Ambulatory Visit: Payer: Self-pay | Admitting: *Deleted

## 2015-11-11 MED ORDER — LEVETIRACETAM 750 MG PO TABS: 750.0000 mg | ORAL_TABLET | Freq: Two times a day (BID) | ORAL | Status: DC

## 2015-11-12 ENCOUNTER — Encounter: Payer: Self-pay | Admitting: Pharmacist Clinician (PhC)/ Clinical Pharmacy Specialist

## 2015-11-12 ENCOUNTER — Other Ambulatory Visit: Payer: Self-pay | Admitting: Pharmacist Clinician (PhC)/ Clinical Pharmacy Specialist

## 2015-11-12 ENCOUNTER — Other Ambulatory Visit (INDEPENDENT_AMBULATORY_CARE_PROVIDER_SITE_OTHER): Payer: Self-pay

## 2015-11-12 DIAGNOSIS — Z113 Encounter for screening for infections with a predominantly sexual mode of transmission: Secondary | ICD-10-CM

## 2015-11-12 DIAGNOSIS — Z79899 Other long term (current) drug therapy: Secondary | ICD-10-CM

## 2015-11-12 DIAGNOSIS — B20 Human immunodeficiency virus [HIV] disease: Secondary | ICD-10-CM

## 2015-11-12 DIAGNOSIS — B182 Chronic viral hepatitis C: Secondary | ICD-10-CM

## 2015-11-12 MED ORDER — DOLUTEGRAVIR SODIUM 50 MG PO TABS: 50.0000 mg | ORAL_TABLET | Freq: Every day | ORAL | Status: DC

## 2015-11-12 MED ORDER — LEVETIRACETAM 750 MG PO TABS: 750.0000 mg | ORAL_TABLET | Freq: Two times a day (BID) | ORAL | Status: DC

## 2015-11-12 MED ORDER — EMTRICITABINE-TENOFOVIR AF 200-25 MG PO TABS: 1.0000 | ORAL_TABLET | Freq: Every day | ORAL | Status: DC

## 2015-11-12 NOTE — Progress Notes (Signed)
Isaac [name] was here for his hep C meds. He has been on Keppra for his sz. We have tried to get him to neuro but it didn't work out. It looks like his orange card is expired. Denise instructed him to renew the orange card so we can start the process again. His refill of keppra is out so put in a couple so he doesn't have gap in meds until the neuro issue is resolved.

## 2015-11-12 NOTE — Progress Notes (Signed)
Patient ID: Isaac [name], male   DOB: [birth year], 55 y.o.   MRN: [id number] Maddin came by to pick up his second month of zepatier. He has not missed any doses. We are going to get labs today. He is one month in so we'll get labs today.

## 2015-11-13 LAB — LIPID PANEL
CHOL/HDL RATIO: 5.1 ratio — AB (ref <=5.0)
Cholesterol: 132 mg/dL (ref 125–200)
HDL: 26 mg/dL — AB (ref >=40)
LDL CALC: 84 mg/dL (ref <130)
Triglycerides: 110 mg/dL (ref <150)
VLDL: 22 mg/dL (ref <30)

## 2015-11-13 LAB — COMPLETE METABOLIC PANEL WITH GFR
ALT: 21 U/L (ref 9–46)
AST: 26 U/L (ref 10–35)
Albumin: 4.3 g/dL (ref 3.6–5.1)
Alkaline Phosphatase: 53 U/L (ref 40–115)
BILIRUBIN TOTAL: 0.3 mg/dL (ref 0.2–1.2)
BUN: 13 mg/dL (ref 7–25)
CO2: 26 mmol/L (ref 20–31)
Calcium: 9.2 mg/dL (ref 8.6–10.3)
Chloride: 105 mmol/L (ref 98–110)
Creat: 1.02 mg/dL (ref 0.70–1.33)
GFR, EST NON AFRICAN AMERICAN: 83 mL/min (ref >=60)
GFR, Est African American: >89 mL/min (ref >=60)
GLUCOSE: 64 mg/dL — AB (ref 65–99)
Potassium: 4.1 mmol/L (ref 3.5–5.3)
SODIUM: 140 mmol/L (ref 135–146)
TOTAL PROTEIN: 7.5 g/dL (ref 6.1–8.1)

## 2015-11-13 LAB — CBC
HCT: 38.9 % — ABNORMAL LOW (ref 39.0–52.0)
Hemoglobin: 12.4 g/dL — ABNORMAL LOW (ref 13.0–17.0)
MCH: 27.3 pg (ref 26.0–34.0)
MCHC: 31.9 g/dL (ref 30.0–36.0)
MCV: 85.7 fL (ref 78.0–100.0)
MPV: 8.2 fL — AB (ref 8.6–12.4)
PLATELETS: 340 10*3/uL (ref 150–400)
RBC: 4.54 MIL/uL (ref 4.22–5.81)
RDW: 14 % (ref 11.5–15.5)
WBC: 4.4 10*3/uL (ref 4.0–10.5)

## 2015-11-13 LAB — URINE CYTOLOGY ANCILLARY ONLY
CHLAMYDIA, DNA PROBE: NEGATIVE
NEISSERIA GONORRHEA: NEGATIVE

## 2015-11-13 LAB — RPR

## 2015-11-14 LAB — HEPATITIS C RNA QUANTITATIVE: HCV QUANT: NOT DETECTED [IU]/mL (ref <15)

## 2015-11-14 LAB — T-HELPER CELL (CD4) - (RCID CLINIC ONLY)
CD4 % Helper T Cell: 28 % — ABNORMAL LOW (ref 33–55)
CD4 T Cell Abs: 690 /uL (ref 400–2700)

## 2015-11-14 LAB — HIV-1 RNA QUANT-NO REFLEX-BLD
HIV 1 RNA Quant: <20 copies/mL (ref <20)
HIV-1 RNA Quant, Log: <1.3 Log copies/mL (ref <1.30)

## 2016-01-06 ENCOUNTER — Telehealth: Payer: Self-pay | Admitting: *Deleted

## 2016-01-06 NOTE — Telephone Encounter (Signed)
Received call from patient. He is unable to come during the office hours to pick up his prescription from patient assistance. Riki Altes will come to pick up patient's prescription this week. Landis Gandy, RN

## 2016-02-17 ENCOUNTER — Other Ambulatory Visit: Payer: Self-pay | Admitting: Infectious Diseases

## 2016-02-17 DIAGNOSIS — I10 Essential (primary) hypertension: Secondary | ICD-10-CM

## 2016-03-02 ENCOUNTER — Other Ambulatory Visit: Payer: Self-pay | Admitting: Pharmacist Clinician (PhC)/ Clinical Pharmacy Specialist

## 2016-03-02 DIAGNOSIS — B182 Chronic viral hepatitis C: Secondary | ICD-10-CM

## 2016-04-06 IMAGING — CT CT HEAD W/O CM
2 series · 16 of 30 positions shown, 20 images · non-contrast
Comparison: Prior study from 10/30/2013.

CLINICAL DATA: Initial blood lesion for dizziness, headache.

EXAM:
CT HEAD WITHOUT CONTRAST
TECHNIQUE: Contiguous axial images were obtained from the base of the skull
through the vertex without intravenous contrast.

[Series 201: head w/o, idose (1) · axial · non-contrast · 0.49mm/px · z∈[+192,+322]mm · 13 of 32 slices shown, 17 images]
[im 3/32  brain]
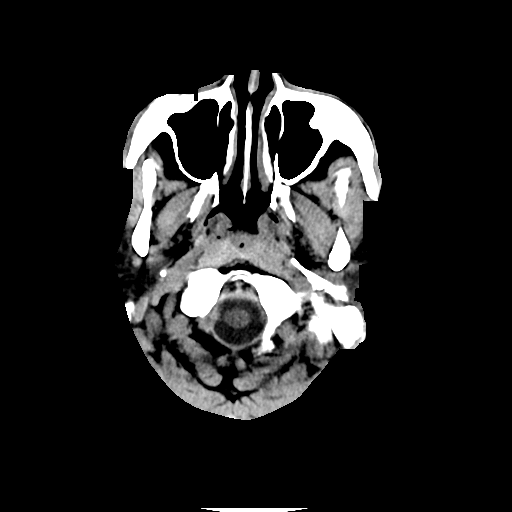
[im 3/32  bone]
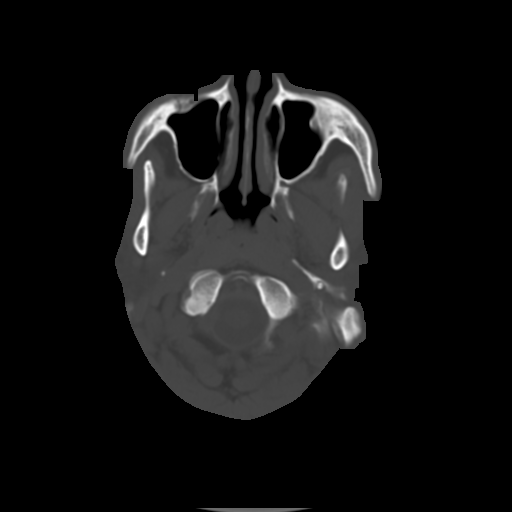
[im 5/32  brain]
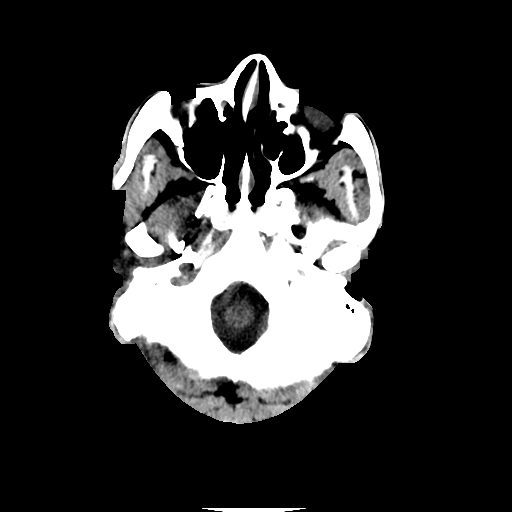
[im 7/32  brain]
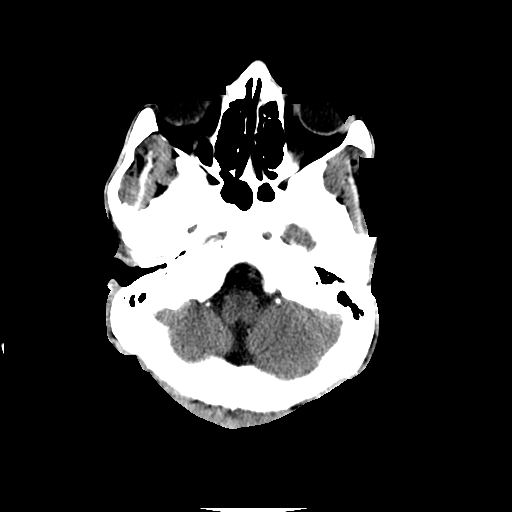
[im 9/32  brain]
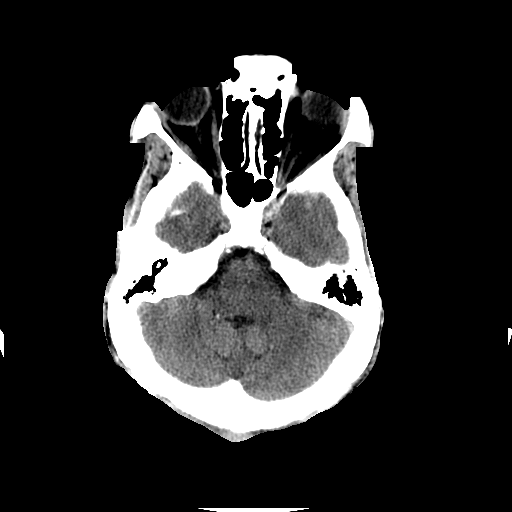
[im 12/32  brain]
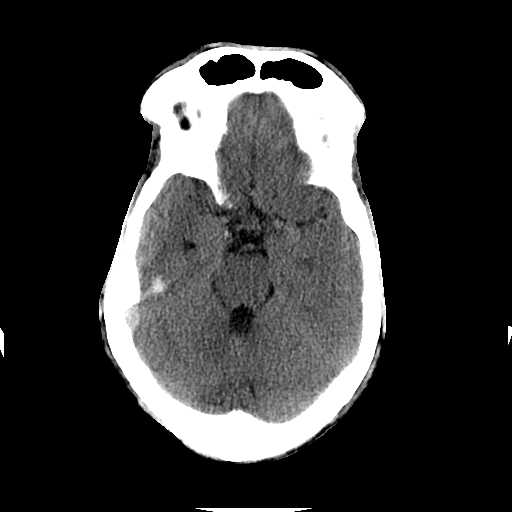
[im 12/32  bone]
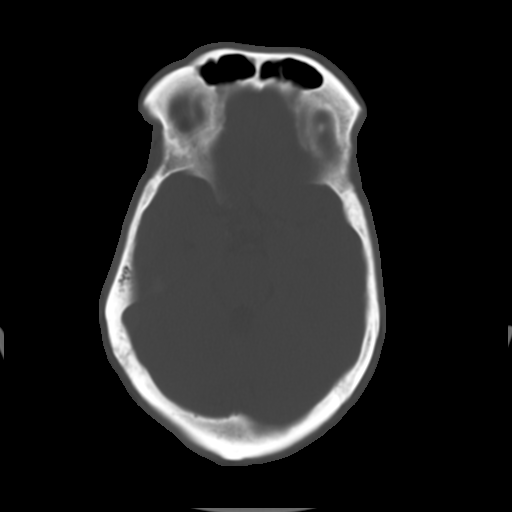
[im 14/32  brain]
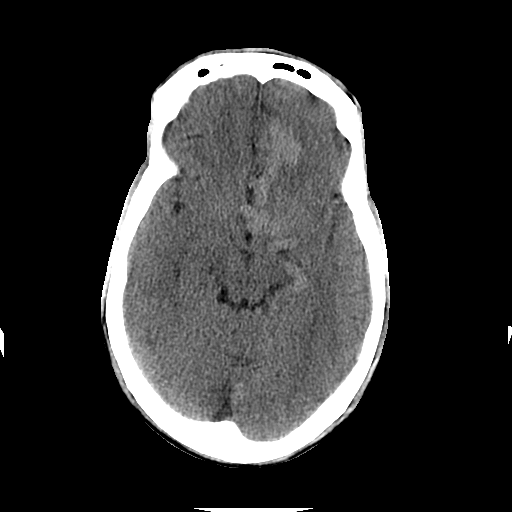
[im 16/32  brain]
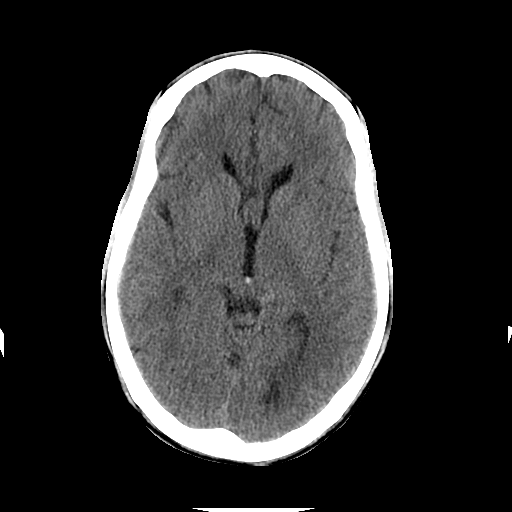
[im 18/32  brain]
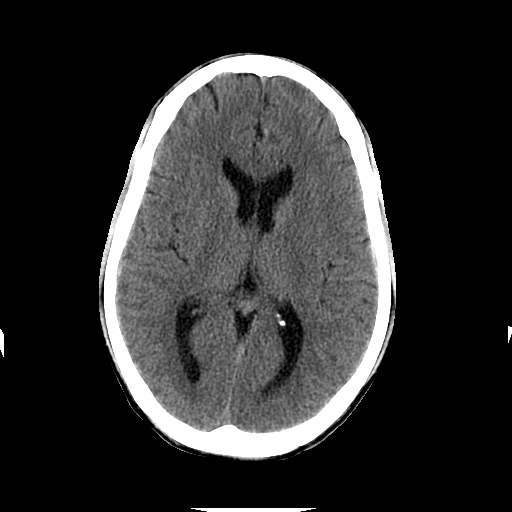
[im 20/32  brain]
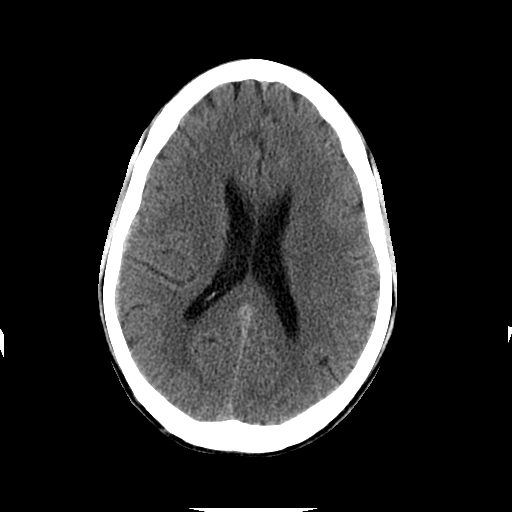
[im 20/32  bone]
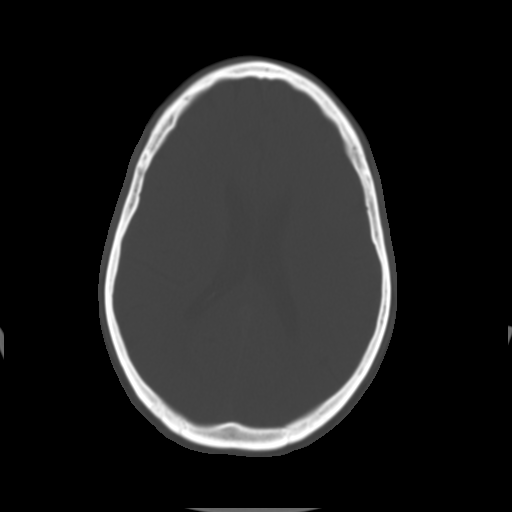
[im 23/32  brain]
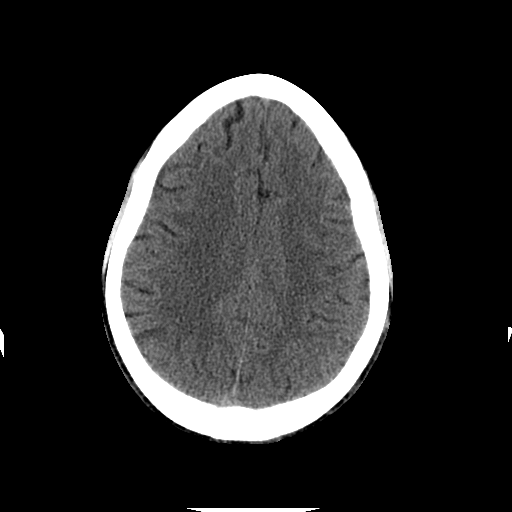
[im 25/32  brain]
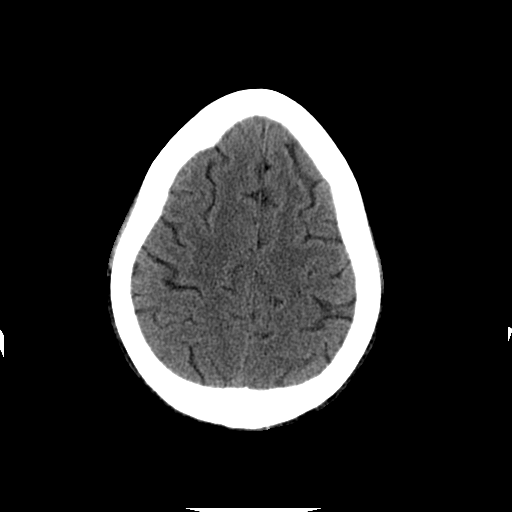
[im 27/32  brain]
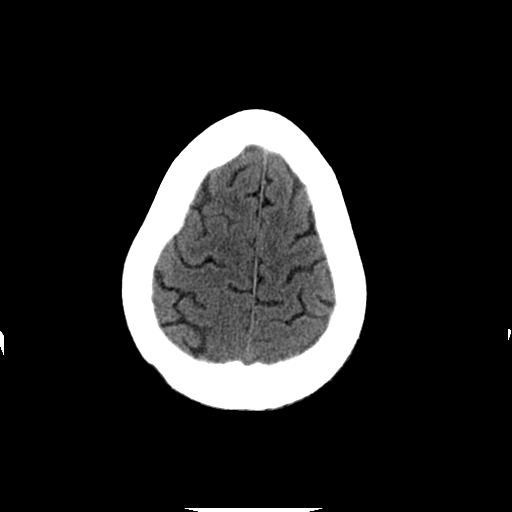
[im 29/32  brain]
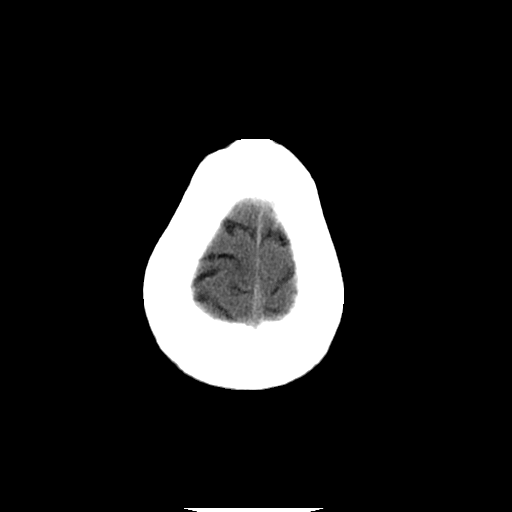
[im 29/32  bone]
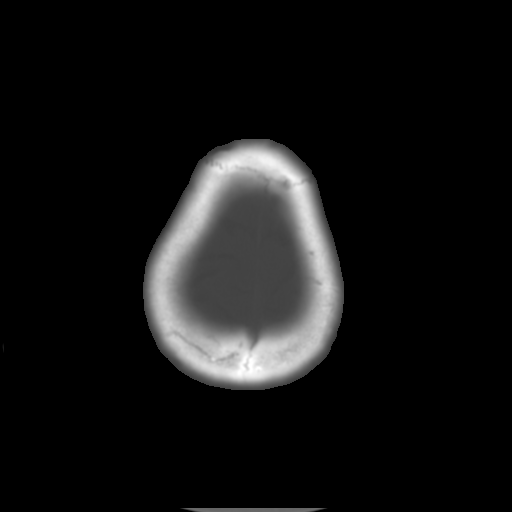

[Series 202: head w/o bone, idose (1) · axial · non-contrast · 0.49mm/px · z∈[+192,+237]mm · 3 of 32 slices shown]
[im 3/32  bone]
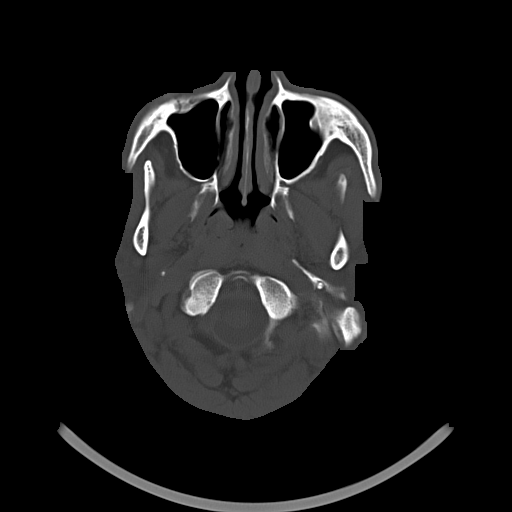
[im 7/32  bone]
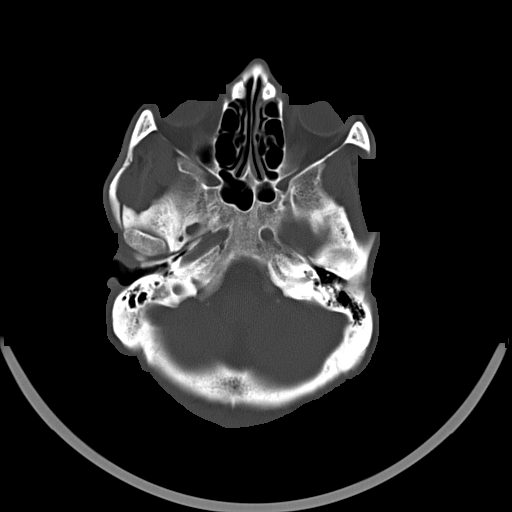
[im 12/32  bone]
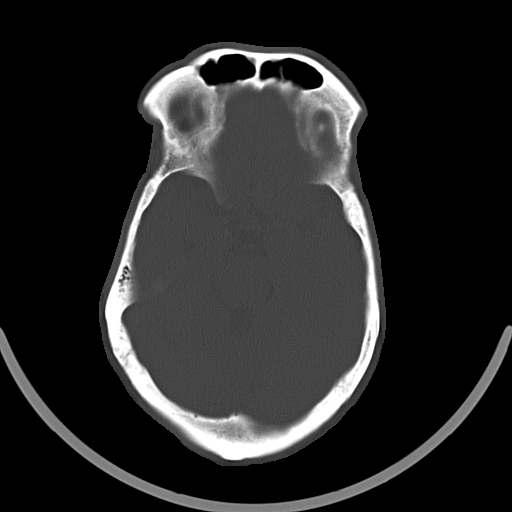

[16 of 30 positions shown; findings below may reference images not displayed]

FINDINGS: No acute intracranial hemorrhage or infarct identified. Known left
frontal lobe AVM is not significantly changed on this noncontrast
CT. No other mass lesion. No midline shift or hydrocephalus. No
extra-axial fluid collection. Gray-white matter differentiation
maintained.

Scalp soft tissues within normal limits.  Calvarium intact.

No acute abnormality seen about the orbits.

Paranasal sinuses and mastoid air cells are clear.
IMPRESSION: 1. No acute intracranial process.
2. Stable left frontal lobe AVM without evidence for hemorrhage or
other acute abnormality.

## 2016-04-29 ENCOUNTER — Encounter: Payer: Self-pay | Admitting: Infectious Diseases

## 2016-05-11 ENCOUNTER — Other Ambulatory Visit (INDEPENDENT_AMBULATORY_CARE_PROVIDER_SITE_OTHER): Payer: Self-pay

## 2016-05-11 DIAGNOSIS — B20 Human immunodeficiency virus [HIV] disease: Secondary | ICD-10-CM

## 2016-05-11 DIAGNOSIS — B182 Chronic viral hepatitis C: Secondary | ICD-10-CM

## 2016-05-11 LAB — COMPLETE METABOLIC PANEL WITH GFR
ALBUMIN: 4.4 g/dL (ref 3.6–5.1)
ALK PHOS: 56 U/L (ref 40–115)
ALT: 22 U/L (ref 9–46)
AST: 28 U/L (ref 10–35)
BILIRUBIN TOTAL: 0.5 mg/dL (ref 0.2–1.2)
BUN: 17 mg/dL (ref 7–25)
CALCIUM: 9.2 mg/dL (ref 8.6–10.3)
CO2: 29 mmol/L (ref 20–31)
Chloride: 102 mmol/L (ref 98–110)
Creat: 1.3 mg/dL (ref 0.70–1.33)
GFR, EST NON AFRICAN AMERICAN: 61 mL/min (ref >=60)
GFR, Est African American: 71 mL/min (ref >=60)
GLUCOSE: 81 mg/dL (ref 65–99)
Potassium: 4.4 mmol/L (ref 3.5–5.3)
SODIUM: 138 mmol/L (ref 135–146)
TOTAL PROTEIN: 7.3 g/dL (ref 6.1–8.1)

## 2016-05-12 LAB — T-HELPER CELL (CD4) - (RCID CLINIC ONLY)
CD4 T CELL ABS: 370 /uL — AB (ref 400–2700)
CD4 T CELL HELPER: 19 % — AB (ref 33–55)

## 2016-05-13 LAB — HEPATITIS C RNA QUANTITATIVE: HCV Quantitative: NOT DETECTED IU/mL (ref <15)

## 2016-05-13 LAB — HIV-1 RNA QUANT-NO REFLEX-BLD: HIV-1 RNA Quant, Log: <1.3 Log copies/mL (ref <1.30)

## 2016-05-29 ENCOUNTER — Other Ambulatory Visit: Payer: Self-pay | Admitting: Infectious Diseases

## 2016-05-29 DIAGNOSIS — R569 Unspecified convulsions: Secondary | ICD-10-CM

## 2016-06-02 ENCOUNTER — Ambulatory Visit (INDEPENDENT_AMBULATORY_CARE_PROVIDER_SITE_OTHER): Payer: Self-pay | Admitting: Infectious Diseases

## 2016-06-02 ENCOUNTER — Encounter: Payer: Self-pay | Admitting: Infectious Diseases

## 2016-06-02 VITALS — BP 149/87 | HR 76 | Temp 98.6°F | Wt 165.0 lb

## 2016-06-02 DIAGNOSIS — R569 Unspecified convulsions: Secondary | ICD-10-CM

## 2016-06-02 DIAGNOSIS — Z23 Encounter for immunization: Secondary | ICD-10-CM

## 2016-06-02 DIAGNOSIS — B20 Human immunodeficiency virus [HIV] disease: Secondary | ICD-10-CM

## 2016-06-02 DIAGNOSIS — Q282 Arteriovenous malformation of cerebral vessels: Secondary | ICD-10-CM

## 2016-06-02 DIAGNOSIS — Z113 Encounter for screening for infections with a predominantly sexual mode of transmission: Secondary | ICD-10-CM

## 2016-06-02 DIAGNOSIS — I10 Essential (primary) hypertension: Secondary | ICD-10-CM

## 2016-06-02 DIAGNOSIS — Z79899 Other long term (current) drug therapy: Secondary | ICD-10-CM

## 2016-06-02 DIAGNOSIS — F17200 Nicotine dependence, unspecified, uncomplicated: Secondary | ICD-10-CM

## 2016-06-02 DIAGNOSIS — B182 Chronic viral hepatitis C: Secondary | ICD-10-CM

## 2016-06-02 MED ORDER — ELVITEG-COBIC-EMTRICIT-TENOFAF 150-150-200-10 MG PO TABS: 1.0000 | ORAL_TABLET | Freq: Every day | ORAL | 3 refills | Status: DC

## 2016-06-02 NOTE — Assessment & Plan Note (Signed)
Cured! Will check liver u/s with his pain.

## 2016-06-02 NOTE — Assessment & Plan Note (Signed)
Working on getting PCP Working on quitting smoking.  Taking anti-htn rx

## 2016-06-02 NOTE — Assessment & Plan Note (Addendum)
Doing well , unexpected CD4 drop.  Flu shot today.  Will change him to genvoya for [place].  rtc in 6 months.  Offered/refused condoms.

## 2016-06-02 NOTE — Assessment & Plan Note (Signed)
Has not had f/u.  Will work on getting into neurosurgery at [employer]

## 2016-06-02 NOTE — Progress Notes (Signed)
   Subjective:    Patient ID: RANDOLL BIR, male    DOB: 1961-03-17, 55 y.o.   MRN: [id number]  HPI 55 yo M with hx of HIV+, Hep C (1a F0/F1 [11-2014]) treated and completed 70-2017, seizure d/o, 10-2014 hospitalized for substance abuse at [place] (ETOH, drug addiction).  Has been on atripl --> descovey/tivicay.  Was hospitalized earlier this year with AVM and seizures.  Worried that he has lost wt (12#) and people have noticed. No change in diet.  Feels like he has   HIV 1 RNA Quant (copies/mL)  Date Value  05/11/2016 <20  11/12/2015 <20  08/06/2015 <20   CD4 T Cell Abs (/uL)  Date Value  05/11/2016 370 (L)  11/12/2015 690  08/06/2015 610    Review of Systems  Constitutional: Positive for unexpected weight change. Negative for appetite change.  Gastrointestinal: Positive for abdominal pain. Negative for blood in stool, constipation and diarrhea.  Genitourinary: Negative for difficulty urinating.  having RUQ pain for last 1/5 months.      Objective:   Physical Exam  Constitutional: He appears well-developed and well-nourished.  HENT:  Mouth/Throat: No oropharyngeal exudate.  Eyes: EOM are normal. Pupils are equal, round, and reactive to light.  Neck: Neck supple.  Cardiovascular: Normal rate, regular rhythm and normal heart sounds.   Pulmonary/Chest: Effort normal and breath sounds normal.  Abdominal: Soft. Bowel sounds are normal. There is no tenderness. There is no rebound.  Musculoskeletal: He exhibits no edema.  Lymphadenopathy:    He has no cervical adenopathy.      Assessment & Plan:

## 2016-06-02 NOTE — Assessment & Plan Note (Signed)
Encouraged to quit. 

## 2016-06-02 NOTE — Assessment & Plan Note (Signed)
Still on keppra.  No further sz wil lf/u with neuro

## 2016-07-02 ENCOUNTER — Other Ambulatory Visit: Payer: Self-pay | Admitting: Infectious Diseases

## 2016-07-02 DIAGNOSIS — R569 Unspecified convulsions: Secondary | ICD-10-CM

## 2016-08-05 IMAGING — US US ABDOMEN COMPLETE W/ ELASTOGRAPHY
1 series · 13 of 13 positions shown · non-contrast
Comparison: None

CLINICAL DATA: HIV, hepatitis-C. Elevated LFTs for 27
years.Atripla.



[Series 1: us abdomen complete w/ elastography · 0.12mm/px · 13 of 13 slices shown]
[im 1/13]
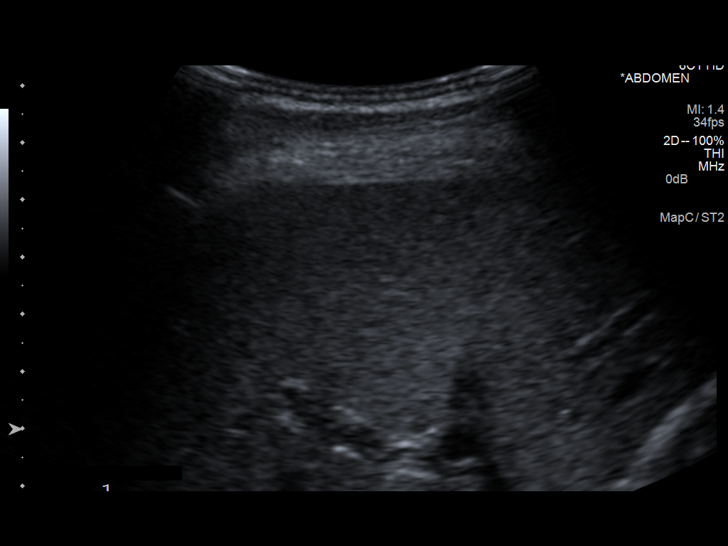
[im 2/13]
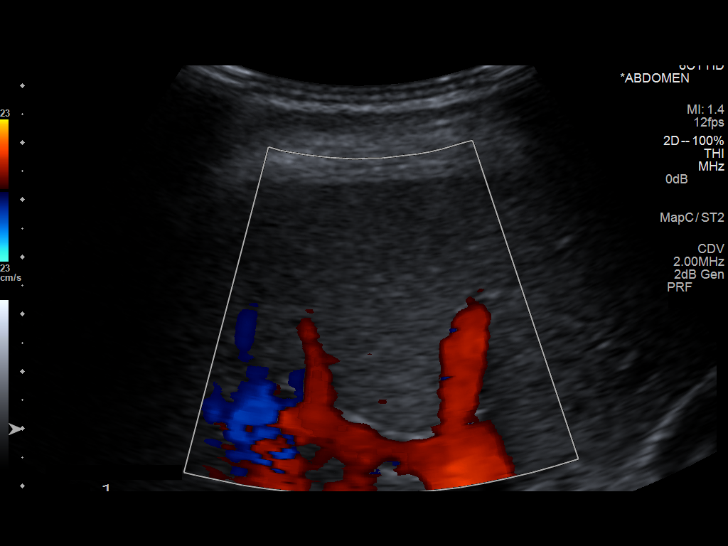
[im 3/13]
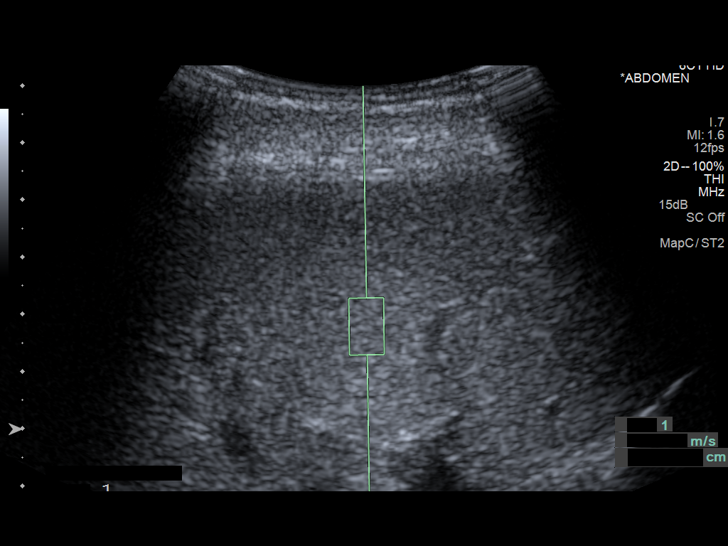
[im 4/13]
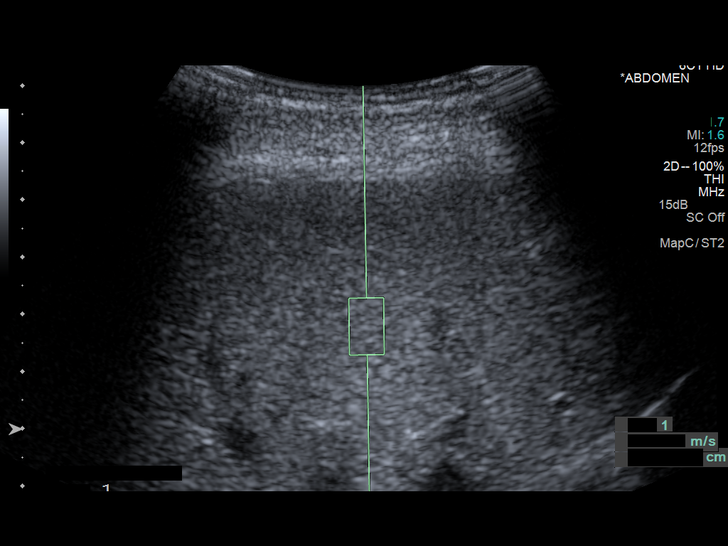
[im 5/13]
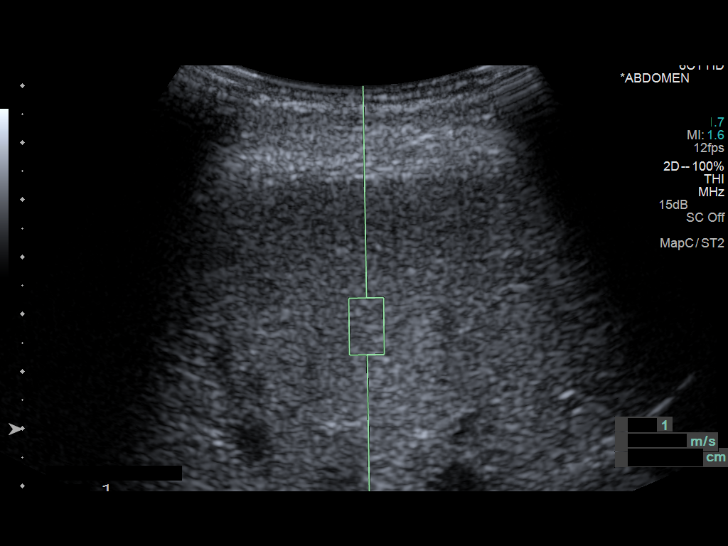
[im 6/13]
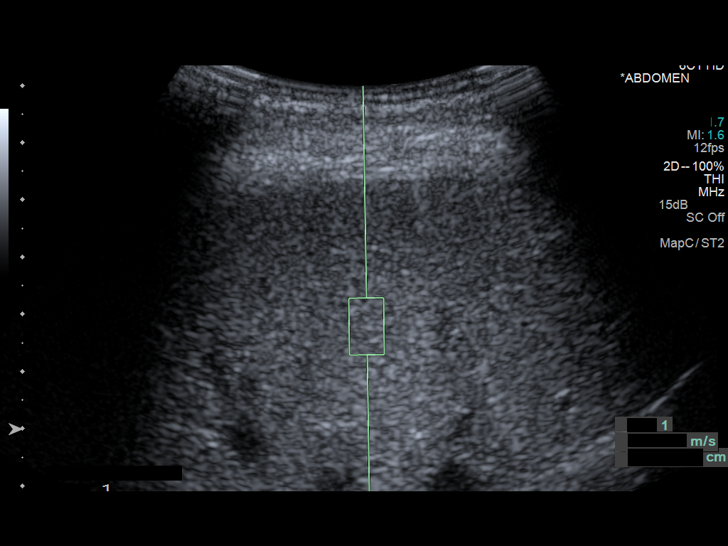
[im 7/13]
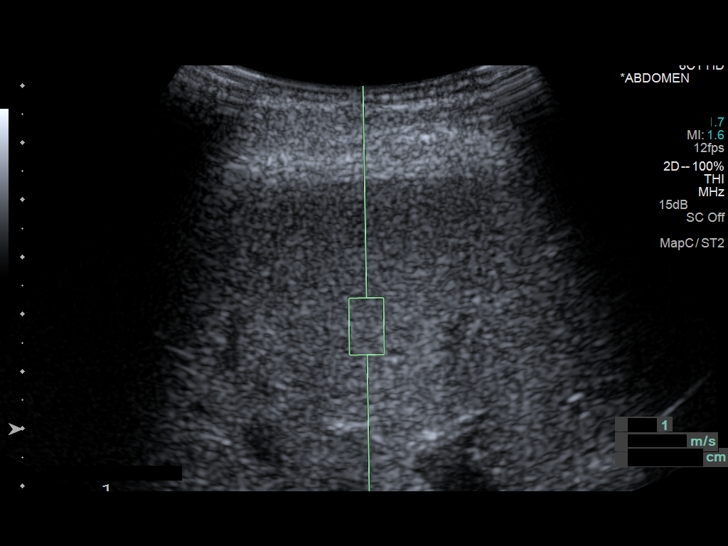
[im 8/13]
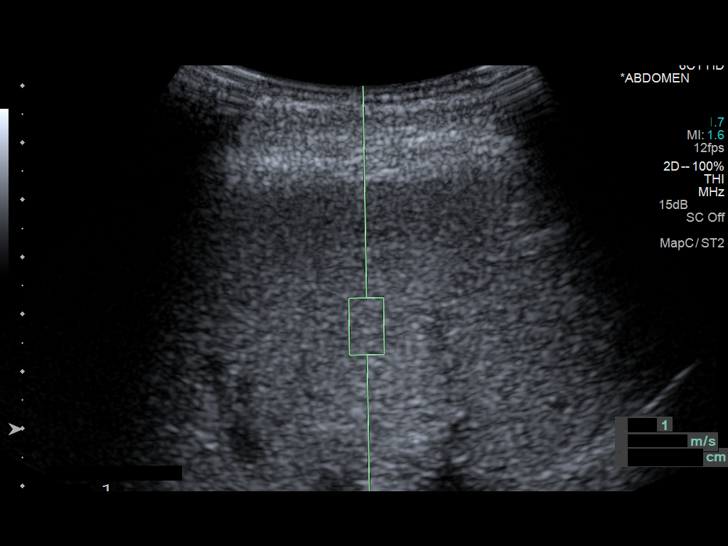
[im 9/13]
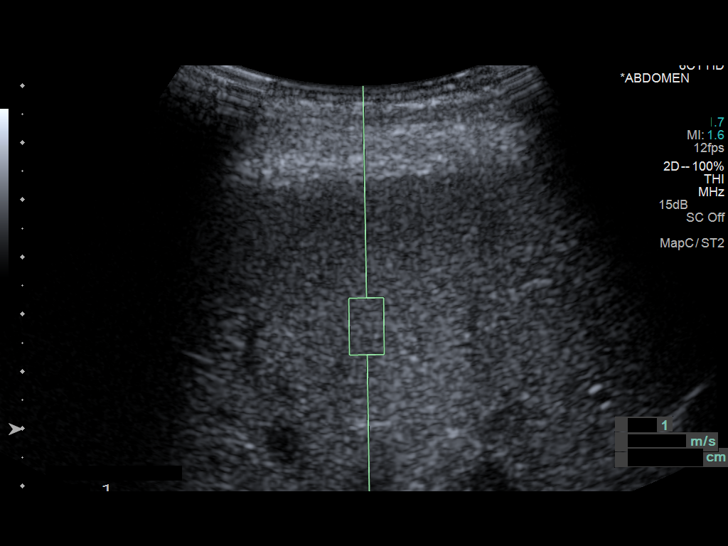
[im 10/13]
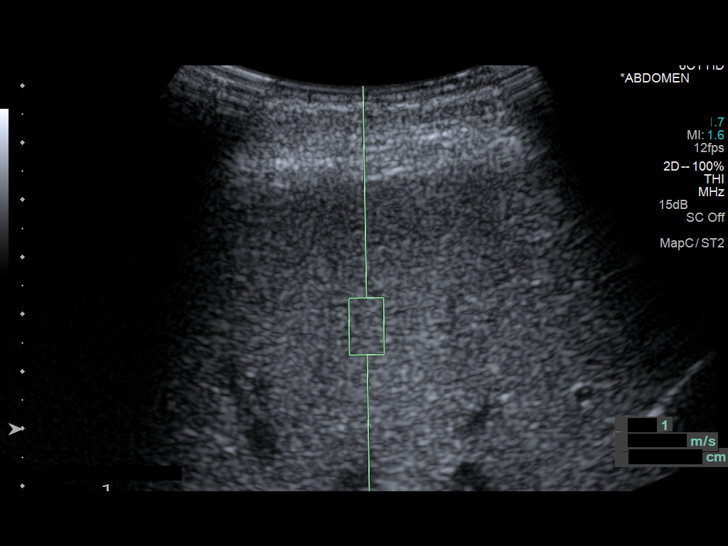
[im 11/13]
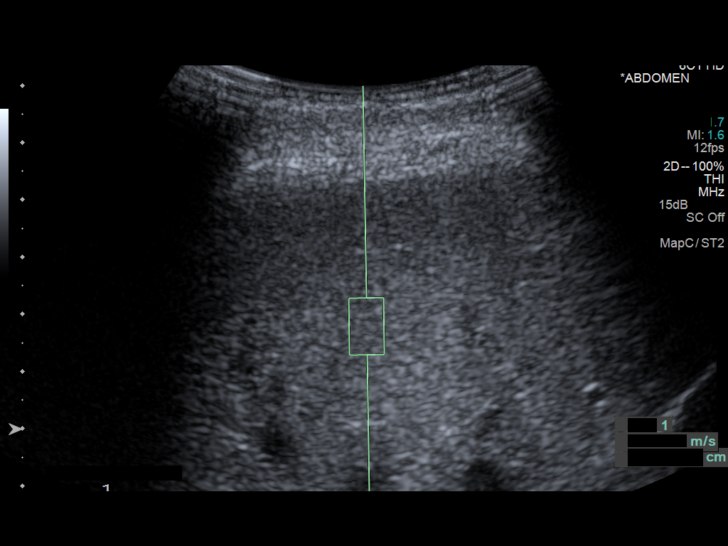
[im 12/13]
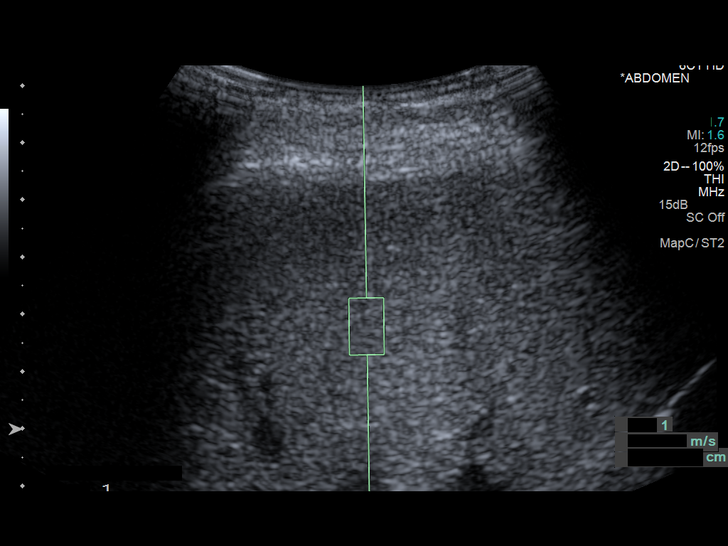
[im 13/13]
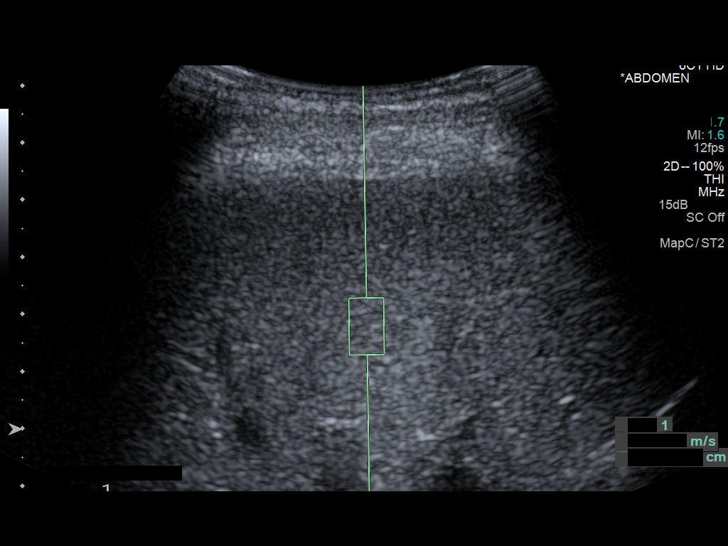

[13 of 13 positions shown; findings below may reference images not displayed]

FINDINGS: ULTRASOUND ABDOMEN

Gallbladder: Gallbladder has a normal appearance. Gallbladder wall
is 2.1 mm, within normal limits. No stones or pericholecystic fluid.
No sonographic Murphy's sign.

Common bile duct: Diameter: 3.2 mm

Liver: Mildly echogenic without focal lesion.

IVC: No abnormality visualized.

Pancreas: Visualized portion unremarkable.

Spleen: Size and appearance within normal limits.

Right Kidney: Length: 11.2 cm. Echogenicity within normal limits. No
mass or hydronephrosis visualized.

Left Kidney: Length: 10.7 cm. Echogenicity within normal limits. No
mass or hydronephrosis visualized.

Abdominal aorta: No aneurysm visualized.

Other findings: None.

ULTRASOUND HEPATIC ELASTOGRAPHY

Device: Siemens Helix VTQ

Transducer 6 C1

Patient position: Left lateral decubitus

Number of measurements:  10

Hepatic Segment:  8

Median velocity:   1.12  m/sec

IQR:

IQR/Median velocity ratio

Corresponding Metavir fibrosis score:  F0/F1

Risk of fibrosis: Minimal

Limitations of exam: None

Pertinent findings noted on other imaging exams:  None

Please note that abnormal shear wave velocities may also be
identified in clinical settings other than with hepatic fibrosis,
such as: acute hepatitis, elevated right heart and central venous
pressures including use of beta blockers, Muradik disease
([place]), infiltrative processes such as
mastocytosis/amyloidosis/infiltrative tumor, extrahepatic
cholestasis, in the post-prandial state, and liver transplantation.
Correlation with patient history, laboratory data, and clinical
condition recommended.
IMPRESSION: 1. Median hepatic shear wave velocity is calculated at 1.12 m/sec.
2. Corresponding Metavir fibrosis score is F0/F1.
3. Risk of fibrosis is minimal.
4. Follow-up:  None required.
5. Grayscale evaluation of the liver shows mild echogenicity without
focal mass.
6. No evidence for acute cholecystitis.

## 2016-08-13 ENCOUNTER — Other Ambulatory Visit: Payer: Self-pay | Admitting: Infectious Diseases

## 2016-08-13 DIAGNOSIS — I10 Essential (primary) hypertension: Secondary | ICD-10-CM

## 2016-08-25 ENCOUNTER — Ambulatory Visit: Payer: Self-pay | Admitting: Infectious Diseases

## 2016-09-16 ENCOUNTER — Telehealth: Payer: Self-pay

## 2016-09-17 ENCOUNTER — Telehealth: Payer: Self-pay | Admitting: *Deleted

## 2016-09-17 NOTE — Telephone Encounter (Signed)
Amy, case worker with [facility] called stating that patient would like a refill of the [place]. He was going to wait to start the medication back, but changed his mind and wants to start it prior to his MD appt. Requesting that an Rx be sent to [facility]. Rx sent

## 2016-09-22 ENCOUNTER — Ambulatory Visit: Payer: Self-pay | Admitting: Infectious Diseases

## 2016-09-30 ENCOUNTER — Encounter: Payer: Self-pay | Admitting: Infectious Diseases

## 2016-10-29 ENCOUNTER — Telehealth: Payer: Self-pay | Admitting: *Deleted

## 2016-10-29 NOTE — Telephone Encounter (Signed)
Patient advised his Rx for viagra is ready for pick up. Myrtis Hopping

## 2016-11-16 ENCOUNTER — Other Ambulatory Visit: Payer: Self-pay

## 2016-12-01 ENCOUNTER — Ambulatory Visit: Payer: Self-pay | Admitting: Infectious Diseases

## 2016-12-27 ENCOUNTER — Ambulatory Visit (INDEPENDENT_AMBULATORY_CARE_PROVIDER_SITE_OTHER): Payer: PRIVATE HEALTH INSURANCE | Admitting: Licensed Clinical Social Worker

## 2016-12-27 ENCOUNTER — Observation Stay (HOSPITAL_COMMUNITY): Admission: AD | Admit: 2016-12-27 | Payer: PRIVATE HEALTH INSURANCE | Source: Intra-hospital | Admitting: Psychiatry

## 2016-12-27 ENCOUNTER — Inpatient Hospital Stay (HOSPITAL_COMMUNITY)
Admission: RE | Admit: 2016-12-27 | Discharge: 2017-01-03 | DRG: 896 | Disposition: A | Discharge status: Home / Self Care | Payer: No Typology Code available for payment source | Source: Other Acute Inpatient Hospital | Attending: Psychiatry | Admitting: Psychiatry

## 2016-12-27 ENCOUNTER — Encounter (HOSPITAL_COMMUNITY): Payer: Self-pay

## 2016-12-27 ENCOUNTER — Emergency Department (HOSPITAL_COMMUNITY)
Admission: EM | Admit: 2016-12-27 | Discharge: 2016-12-27 | Disposition: A | Discharge status: Other Acute Inpatient Hospital | Payer: Self-pay | Attending: Emergency Medicine | Admitting: Emergency Medicine

## 2016-12-27 ENCOUNTER — Encounter (HOSPITAL_COMMUNITY): Payer: Self-pay | Admitting: Emergency Medicine

## 2016-12-27 DIAGNOSIS — F333 Major depressive disorder, recurrent, severe with psychotic symptoms: Secondary | ICD-10-CM | POA: Diagnosis present

## 2016-12-27 DIAGNOSIS — F419 Anxiety disorder, unspecified: Secondary | ICD-10-CM | POA: Diagnosis present

## 2016-12-27 DIAGNOSIS — F1910 Other psychoactive substance abuse, uncomplicated: Secondary | ICD-10-CM | POA: Diagnosis present

## 2016-12-27 DIAGNOSIS — F1010 Alcohol abuse, uncomplicated: Secondary | ICD-10-CM | POA: Diagnosis present

## 2016-12-27 DIAGNOSIS — F1994 Other psychoactive substance use, unspecified with psychoactive substance-induced mood disorder: Secondary | ICD-10-CM | POA: Diagnosis present

## 2016-12-27 DIAGNOSIS — G4700 Insomnia, unspecified: Secondary | ICD-10-CM | POA: Diagnosis present

## 2016-12-27 DIAGNOSIS — F1420 Cocaine dependence, uncomplicated: Secondary | ICD-10-CM

## 2016-12-27 DIAGNOSIS — F17210 Nicotine dependence, cigarettes, uncomplicated: Secondary | ICD-10-CM | POA: Insufficient documentation

## 2016-12-27 DIAGNOSIS — F1414 Cocaine abuse with cocaine-induced mood disorder: Principal | ICD-10-CM | POA: Diagnosis present

## 2016-12-27 DIAGNOSIS — I10 Essential (primary) hypertension: Secondary | ICD-10-CM | POA: Diagnosis present

## 2016-12-27 DIAGNOSIS — Z008 Encounter for other general examination: Secondary | ICD-10-CM

## 2016-12-27 DIAGNOSIS — Z79899 Other long term (current) drug therapy: Secondary | ICD-10-CM | POA: Insufficient documentation

## 2016-12-27 DIAGNOSIS — F1020 Alcohol dependence, uncomplicated: Secondary | ICD-10-CM

## 2016-12-27 DIAGNOSIS — R45851 Suicidal ideations: Secondary | ICD-10-CM | POA: Diagnosis present

## 2016-12-27 DIAGNOSIS — G40909 Epilepsy, unspecified, not intractable, without status epilepticus: Secondary | ICD-10-CM | POA: Diagnosis present

## 2016-12-27 DIAGNOSIS — B20 Human immunodeficiency virus [HIV] disease: Secondary | ICD-10-CM | POA: Diagnosis present

## 2016-12-27 DIAGNOSIS — Q282 Arteriovenous malformation of cerebral vessels: Secondary | ICD-10-CM

## 2016-12-27 LAB — ETHANOL

## 2016-12-27 LAB — RAPID URINE DRUG SCREEN, HOSP PERFORMED
AMPHETAMINES: NOT DETECTED
BENZODIAZEPINES: NOT DETECTED
Barbiturates: NOT DETECTED
COCAINE: POSITIVE — AB
OPIATES: NOT DETECTED
Tetrahydrocannabinol: NOT DETECTED

## 2016-12-27 LAB — COMPREHENSIVE METABOLIC PANEL
ALBUMIN: 3.6 g/dL (ref 3.5–5.0)
ALT: 56 U/L (ref 17–63)
ANION GAP: 7 (ref 5–15)
AST: 74 U/L — AB (ref 15–41)
Alkaline Phosphatase: 58 U/L (ref 38–126)
BILIRUBIN TOTAL: 0.3 mg/dL (ref 0.3–1.2)
BUN: 24 mg/dL — AB (ref 6–20)
CHLORIDE: 107 mmol/L (ref 101–111)
CO2: 25 mmol/L (ref 22–32)
Calcium: 8.5 mg/dL — ABNORMAL LOW (ref 8.9–10.3)
Creatinine, Ser: 1.09 mg/dL (ref 0.61–1.24)
GFR calc Af Amer: >60 mL/min (ref >60)
GFR calc non Af Amer: >60 mL/min (ref >60)
Glucose, Bld: 115 mg/dL — ABNORMAL HIGH (ref 65–99)
POTASSIUM: 3.8 mmol/L (ref 3.5–5.1)
SODIUM: 139 mmol/L (ref 135–145)
TOTAL PROTEIN: 6.8 g/dL (ref 6.5–8.1)

## 2016-12-27 LAB — CBC
HCT: 34.6 % — ABNORMAL LOW (ref 39.0–52.0)
Hemoglobin: 11.5 g/dL — ABNORMAL LOW (ref 13.0–17.0)
MCH: 27.6 pg (ref 26.0–34.0)
MCHC: 33.2 g/dL (ref 30.0–36.0)
MCV: 83 fL (ref 78.0–100.0)
PLATELETS: 320 10*3/uL (ref 150–400)
RBC: 4.17 MIL/uL — AB (ref 4.22–5.81)
RDW: 12.5 % (ref 11.5–15.5)
WBC: 5.4 10*3/uL (ref 4.0–10.5)

## 2016-12-27 LAB — ACETAMINOPHEN LEVEL

## 2016-12-27 LAB — SALICYLATE LEVEL: Salicylate Lvl: <7 mg/dL (ref 2.8–30.0)

## 2016-12-27 MED ORDER — TRAZODONE HCL 50 MG PO TABS
50.0000 mg | ORAL_TABLET | Freq: Every evening | ORAL | Status: DC | PRN
Start: 2016-12-27 — End: 2016-12-27

## 2016-12-27 MED ORDER — THIAMINE HCL 100 MG/ML IJ SOLN: 100.0000 mg | Freq: Once | INTRAMUSCULAR | Status: DC

## 2016-12-27 MED ORDER — TRAZODONE HCL 50 MG PO TABS
50.0000 mg | ORAL_TABLET | Freq: Every evening | ORAL | Status: DC | PRN
  Administered 2016-12-27 – 2016-12-31 (×3): 50 mg via ORAL
  Filled 2016-12-27 (×13): qty 1

## 2016-12-27 MED ORDER — LORAZEPAM 1 MG PO TABS
1.0000 mg | ORAL_TABLET | Freq: Three times a day (TID) | ORAL | Status: AC
  Administered 2016-12-29 – 2016-12-30 (×3): 1 mg via ORAL
  Filled 2016-12-27 (×3): qty 1

## 2016-12-27 MED ORDER — LORAZEPAM 1 MG PO TABS
1.0000 mg | ORAL_TABLET | Freq: Four times a day (QID) | ORAL | Status: AC
  Administered 2016-12-27 – 2016-12-29 (×6): 1 mg via ORAL
  Filled 2016-12-27 (×6): qty 1

## 2016-12-27 MED ORDER — ELVITEG-COBIC-EMTRICIT-TENOFAF 150-150-200-10 MG PO TABS
1.0000 | ORAL_TABLET | Freq: Every day | ORAL | Status: DC
  Administered 2016-12-28 – 2017-01-03 (×7): 1 via ORAL
  Filled 2016-12-27 (×10): qty 1

## 2016-12-27 MED ORDER — LISINOPRIL 10 MG PO TABS
10.0000 mg | ORAL_TABLET | Freq: Every day | ORAL | Status: DC
  Administered 2016-12-28 – 2017-01-03 (×7): 10 mg via ORAL
  Filled 2016-12-27 (×2): qty 1
  Filled 2016-12-27: qty 2
  Filled 2016-12-27: qty 7
  Filled 2016-12-27 (×6): qty 1

## 2016-12-27 MED ORDER — LEVETIRACETAM 750 MG PO TABS
750.0000 mg | ORAL_TABLET | Freq: Two times a day (BID) | ORAL | Status: DC
  Administered 2016-12-27 – 2017-01-03 (×14): 750 mg via ORAL
  Filled 2016-12-27: qty 42
  Filled 2016-12-27 (×6): qty 1
  Filled 2016-12-27: qty 3
  Filled 2016-12-27 (×5): qty 1
  Filled 2016-12-27: qty 42
  Filled 2016-12-27 (×4): qty 1
  Filled 2016-12-27: qty 3
  Filled 2016-12-27: qty 1

## 2016-12-27 MED ORDER — HYDROCHLOROTHIAZIDE 12.5 MG PO CAPS
12.5000 mg | ORAL_CAPSULE | Freq: Every day | ORAL | Status: DC
  Administered 2016-12-28 – 2017-01-03 (×7): 12.5 mg via ORAL
  Filled 2016-12-27 (×11): qty 1

## 2016-12-27 MED ORDER — LOPERAMIDE HCL 2 MG PO CAPS: 2.0000 mg | ORAL_CAPSULE | ORAL | Status: AC | PRN

## 2016-12-27 MED ORDER — HYDROXYZINE HCL 25 MG PO TABS: 25.0000 mg | ORAL_TABLET | Freq: Four times a day (QID) | ORAL | Status: DC | PRN

## 2016-12-27 MED ORDER — VITAMIN B-1 100 MG PO TABS
100.0000 mg | ORAL_TABLET | Freq: Every day | ORAL | Status: DC
  Administered 2016-12-28 – 2017-01-03 (×7): 100 mg via ORAL
  Filled 2016-12-27 (×10): qty 1

## 2016-12-27 MED ORDER — LORAZEPAM 1 MG PO TABS: 1.0000 mg | ORAL_TABLET | Freq: Four times a day (QID) | ORAL | Status: AC | PRN

## 2016-12-27 MED ORDER — LISINOPRIL-HYDROCHLOROTHIAZIDE 10-12.5 MG PO TABS: 1.0000 | ORAL_TABLET | Freq: Every day | ORAL | Status: DC

## 2016-12-27 MED ORDER — ADULT MULTIVITAMIN W/MINERALS CH
1.0000 | ORAL_TABLET | Freq: Every day | ORAL | Status: DC
  Administered 2016-12-28 – 2017-01-03 (×7): 1 via ORAL
  Filled 2016-12-27 (×8): qty 1

## 2016-12-27 MED ORDER — LORAZEPAM 1 MG PO TABS: 1.0000 mg | ORAL_TABLET | Freq: Every day | ORAL | Status: AC

## 2016-12-27 MED ORDER — MAGNESIUM HYDROXIDE 400 MG/5ML PO SUSP: 30.0000 mL | Freq: Every day | ORAL | Status: DC | PRN

## 2016-12-27 MED ORDER — ONDANSETRON 4 MG PO TBDP: 4.0000 mg | ORAL_TABLET | Freq: Four times a day (QID) | ORAL | Status: AC | PRN

## 2016-12-27 MED ORDER — ALUM & MAG HYDROXIDE-SIMETH 200-200-20 MG/5ML PO SUSP: 30.0000 mL | ORAL | Status: DC | PRN

## 2016-12-27 MED ORDER — ACETAMINOPHEN 325 MG PO TABS: 650.0000 mg | ORAL_TABLET | Freq: Four times a day (QID) | ORAL | Status: DC | PRN

## 2016-12-27 MED ORDER — LORAZEPAM 1 MG PO TABS
1.0000 mg | ORAL_TABLET | Freq: Two times a day (BID) | ORAL | Status: AC
  Administered 2016-12-30 – 2016-12-31 (×2): 1 mg via ORAL
  Filled 2016-12-27 (×2): qty 1
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
## 2016-12-27 NOTE — Progress Notes (Signed)
Pt is positive for HIV and Hep C

## 2016-12-27 NOTE — Discharge Instructions (Signed)
Patient may return to behavioral health. He may take ibuprofen or tylenol for his joint discomfort.

## 2016-12-27 NOTE — ED Triage Notes (Signed)
Patient was brought in by [facility]. Patient is to be sent back to [facility] after being medically cleared. Patient states he has been drinking alcohol and smoking crack cocaine for about 3 weeks. Patient last had both alcohol and crack yesterday. Patient denies SI/HI/AVH.

## 2016-12-27 NOTE — Progress Notes (Signed)
Pt on obs unit bed 3 after medical clearance. NKA. Pt is detox from crack and ETOH abuse.  Pt presents with overall difuse joint ache.  Pt sts pain is a 6-7.  Pt denies SI, HI and AVH.  Pt contracts for safety.  Pt was in a drug halfway house and was tested positive and asked to leave.  Pt has been homeless for 16 days.  Pt previously "clean" x 2 years.  Pt is alert, oriented, cooperative and answers all questions.  Pt sts he just wants something to help with pain and withdrawal management, and to got to sleep. Pt has hx of seizures and has medication for it. Orders placed for patient.  Pt placed on high fall risk.  Pt continuously observed for safety on unit except when in the bathroom. Pt remains safe on unit.
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
## 2016-12-27 NOTE — H&P (Signed)
Behavioral Health Medical Screening Exam  Isaac [name] is an 56 y.o. male who presented as a walk in to get help with alcohol and cocaine abuse. Patient reports "I have relapsed again. I became homeless. I don't want to lose my job. I have been using a great deal of cocaine everyday. I'm afraid I will not make it like this for very long." Patient to be sent to the ED for medical clearance then admitted to the [facility] Unit. Patient has a medical history of HIV disease for many years.   Total Time spent with patient: 20 minutes  Psychiatric Specialty Exam: Physical Exam  Constitutional: He is oriented to person, place, and time. He appears well-developed and well-nourished.  HENT:  Head: Normocephalic and atraumatic.  Right Ear: External ear normal.  Left Ear: External ear normal.  Nose: Nose normal.  Mouth/Throat: Oropharynx is clear and moist.  Eyes: Conjunctivae and EOM are normal. Pupils are equal, round, and reactive to light.  Neck: Normal range of motion. Neck supple.  Cardiovascular: Normal rate, regular rhythm, normal heart sounds and intact distal pulses.   Respiratory: Effort normal and breath sounds normal.  GI: Soft. Bowel sounds are normal.  Musculoskeletal: Normal range of motion.  Neurological: He is alert and oriented to person, place, and time.  Skin: Skin is warm and dry.    Review of Systems  Psychiatric/Behavioral: Positive for depression and substance abuse. Negative for hallucinations, memory loss and suicidal ideas. The patient is nervous/anxious. The patient does not have insomnia.     Blood pressure 136/69, pulse 73, resp. rate 18.There is no height or weight on file to calculate BMI.  General Appearance: Casual  Eye Contact:  Good  Speech:  Clear and Coherent  Volume:  Normal  Mood:  Dysphoric  Affect:  Flat  Thought Process:  Coherent and Goal Directed  Orientation:  Full (Time, Place, and Person)  Thought Content:  Desire to stop using drugs    Suicidal Thoughts:  No  Homicidal Thoughts:  No  Memory:  Immediate;   Good Recent;   Good Remote;   Good  Judgement:  Fair  Insight:  Present  Psychomotor Activity:  Decreased  Concentration: Concentration: Good and Attention Span: Good  Recall:  Good  Fund of Knowledge:Good  Language: Good  Akathisia:  No  Handed:  Right  AIMS (if indicated):     Assets:  Communication Skills Desire for Improvement Leisure Time Resilience Social Support Talents/Skills  Sleep:       Musculoskeletal: Strength & Muscle Tone: within normal limits Gait & Station: normal Patient leans: N/A  Blood pressure 136/69, pulse 73, resp. rate 18.  Recommendations:  Based on my evaluation the patient does not appear to have an emergency medical condition.  Elmarie Shiley, NP 12/27/2016, 2:41 PM   Agree with NP note

## 2016-12-28 DIAGNOSIS — R45851 Suicidal ideations: Secondary | ICD-10-CM

## 2016-12-28 DIAGNOSIS — F1099 Alcohol use, unspecified with unspecified alcohol-induced disorder: Secondary | ICD-10-CM

## 2016-12-28 DIAGNOSIS — F333 Major depressive disorder, recurrent, severe with psychotic symptoms: Secondary | ICD-10-CM

## 2016-12-28 DIAGNOSIS — F1994 Other psychoactive substance use, unspecified with psychoactive substance-induced mood disorder: Secondary | ICD-10-CM

## 2016-12-28 DIAGNOSIS — F1490 Cocaine use, unspecified, uncomplicated: Secondary | ICD-10-CM

## 2016-12-28 NOTE — Progress Notes (Signed)
Pt ate breakfast and took meds after waking at approximately 0900. He denied SI, HI, and AVH. He appeared a bit anxious but was cooperative and appropriate. Pt was urged to verbalized needs/concerns. Pt resumed resting with eyes closed and regular/even respirations. Will continue to monitor for needs/safety.

## 2016-12-28 NOTE — Progress Notes (Signed)
After pt signed consent form, tried to reach pt's supervisor to notify them that pt is in hospital, in accordance with pt's wishes. Was unsuccessful and left HIPAA-compliant messages.
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
## 2016-12-28 NOTE — Progress Notes (Signed)
Pt awake and in bed.  Pt sts he feels better today.  Pt denies pain or discomfort.  Pt denies SI, HI and AVH.  Pt verbally contracts for safety. Pt sts he would like some snacks and ice cream.   Pt reoriented to where snacks are located for self-service.  Pt given ice cream.  Pt continuously observed for safety except when in bathroom. Pt remains safe on unit.

## 2016-12-28 NOTE — Progress Notes (Addendum)
Pt's supervisor, Rodena Goldmann [phone], returned call. With pt's permission, told him that pt was in hospital.

## 2016-12-29 ENCOUNTER — Other Ambulatory Visit: Payer: Self-pay

## 2016-12-29 DIAGNOSIS — G4700 Insomnia, unspecified: Secondary | ICD-10-CM | POA: Diagnosis present

## 2016-12-29 DIAGNOSIS — F333 Major depressive disorder, recurrent, severe with psychotic symptoms: Secondary | ICD-10-CM | POA: Diagnosis present

## 2016-12-29 DIAGNOSIS — F1010 Alcohol abuse, uncomplicated: Secondary | ICD-10-CM | POA: Diagnosis present

## 2016-12-29 DIAGNOSIS — I10 Essential (primary) hypertension: Secondary | ICD-10-CM | POA: Diagnosis present

## 2016-12-29 DIAGNOSIS — F419 Anxiety disorder, unspecified: Secondary | ICD-10-CM | POA: Diagnosis present

## 2016-12-29 DIAGNOSIS — F17210 Nicotine dependence, cigarettes, uncomplicated: Secondary | ICD-10-CM | POA: Diagnosis present

## 2016-12-29 DIAGNOSIS — R45851 Suicidal ideations: Secondary | ICD-10-CM | POA: Diagnosis present

## 2016-12-29 DIAGNOSIS — B20 Human immunodeficiency virus [HIV] disease: Secondary | ICD-10-CM | POA: Diagnosis present

## 2016-12-29 DIAGNOSIS — G40909 Epilepsy, unspecified, not intractable, without status epilepticus: Secondary | ICD-10-CM | POA: Diagnosis present

## 2016-12-29 DIAGNOSIS — F1414 Cocaine abuse with cocaine-induced mood disorder: Secondary | ICD-10-CM | POA: Diagnosis present

## 2016-12-29 DIAGNOSIS — Q282 Arteriovenous malformation of cerebral vessels: Secondary | ICD-10-CM | POA: Diagnosis not present

## 2016-12-29 NOTE — Tx Team (Signed)
Initial Treatment Plan 12/29/2016 3:30 PM [name]    PATIENT STRESSORS: Financial difficulties Health problems Substance abuse   PATIENT STRENGTHS: Capable of independent living Communication skills General fund of knowledge Work skills   PATIENT IDENTIFIED PROBLEMS: Depression  Suicidal ideation  Substance abuse  "Get back on track"  "Be able to keep my job while I am here"             DISCHARGE CRITERIA:  Improved stabilization in mood, thinking, and/or behavior Motivation to continue treatment in a less acute level of care Verbal commitment to aftercare and medication compliance Withdrawal symptoms are absent or subacute and managed without 24-hour nursing intervention  PRELIMINARY DISCHARGE PLAN: Outpatient therapy Medication management  PATIENT/FAMILY INVOLVEMENT: This treatment plan has been presented to and reviewed with the patient, Isaac [name].  The patient and family have been given the opportunity to ask questions and make suggestions.  Windell Moment, RN 12/29/2016, 3:30 PM
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
## 2016-12-29 NOTE — Plan of Care (Signed)
Problem: Safety: Goal: Periods of time without injury will increase Outcome: Progressing Pt safe on the unit at this time   

## 2016-12-29 NOTE — Progress Notes (Signed)
D: Pt denies SI/HI/AVH. Pt is pleasant and cooperative. Pt kept to himself in his room all evening.   A: Pt was offered support and encouragement. Pt was given scheduled medications. Pt was encourage to attend groups. Q 15 minute checks were done for safety.   R: Pt receptive to treatment and safety maintained on unit.

## 2016-12-29 NOTE — Progress Notes (Signed)
[place] denied SI, HI, and AVH. He denied withdrawal symptoms or pain. He took morning medications without issue. Reviewed plan of care with patient - plan to wait for an inpatient bed. [place] indicated he is willing to wait. Will continue to monitor for needs/safety.

## 2016-12-29 NOTE — Progress Notes (Signed)
Isaac [name] is a 56 year old male being admitted voluntarily to [facility] from OBS unit.  He came to the ED for suicidal ideation and alcohol/cocaine abuse.  He denies any pain or discomfort and appears to be in no physical distress.  He denied HI or A/V hallucinations.  He denies current SI and will contract for safety on the unit.  Oriented him to the unit. Admission paperwork completed and signed.  Belongings searched and secured in locker # 73 and 59. Q 15 minute checks initiated for safety.  We will monitor the progress towards his goals.
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
## 2016-12-30 NOTE — BHH Group Notes (Signed)
Power LCSW Group Therapy  12/30/2016 1:52 PM  Type of Therapy:  Group Therapy  Participation Level:  Did Not Attend-pt invited. Chose to remain in bed.   Summary of Progress/Problems: [facility] Speaker came to talk about his personal journey with substance abuse and addiction. The pt processed ways by which to relate to the speaker. [place] speaker provided handouts and educational information pertaining to groups and services offered by the [facility].   Tiffnay Bossi N Smart LCSW 12/30/2016, 1:52 PM

## 2016-12-30 NOTE — Progress Notes (Signed)
DAR NOTE: Patient presents with sad affect and depressed mood.  Denies auditory and visual hallucinations.  Denies any withdrawal symptoms.  Rates depression at 0, hopelessness at 5, and anxiety at 5.  Maintained on routine safety checks.  Medications given as prescribed.  Support and encouragement offered as needed.  Patient preoccupied and concern about losing his job if he stays here longer.  Minimal interaction with staff and peers.  Patient visible in milieu briefly.  Offered no complaint.

## 2016-12-30 NOTE — Progress Notes (Signed)
  DATA ACTION RESPONSE  Objective- Pt. is visible in the room, seen resting in bed with eyes open. Presents with a flat/depressed affect and mood. Pt. was minimal with interaction. Remains isolative to room.   Subjective- Denies having any SI/HI/AVH/Pain at this time. Pt. states " I don't feel like going to [place]; just not today". Pt. remain safe on the unit.  1:1 interaction in private to establish rapport. Encouragement, education, & support given from staff. Meds. ordered and refused. Snacks and fluids requested and given.    Safety maintained with Q 15 checks. Continues to follow treatment plan and will monitor closely. No additonal questions/concerns noted.

## 2016-12-30 NOTE — Plan of Care (Signed)
Problem: Activity: Goal: Interest or engagement in activities will improve Outcome: Not Progressing Pt. refused karaoke this evening. Encouragement given.
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
## 2016-12-31 NOTE — Plan of Care (Signed)
Problem: Safety: Goal: Periods of time without injury will increase Outcome: Progressing Pt. remains a high fall risk (hx of seizures), denies SI/HI/AVH at this time, Q 15 checks in effect.

## 2016-12-31 NOTE — Progress Notes (Signed)
DAR Note: Isaac [name] has been in the bed much of the morning.  He will come out for meals and medications.  He reported that he is really depressed and that is his main complaint.  He denies SI/HI or A/V hallucinations.  He denies pain or discomfort and appears to be in no physical distress.  He completed his self inventory and reported that his depression, hopelessness and anxiety are 8/10.  He reported is goal for today was "I don't know" and he will accomplish this goal by "I don't know."  He was unable to come up with another goal even after much encouragement.  Encouraged participation in group and unit activities.  Q 15 minute checks maintained for safety.  We will continue to monitor the progress towards his goals.  He remains safe on the unit.

## 2016-12-31 NOTE — Progress Notes (Signed)
DATA ACTION RESPONSE  Objective- Pt. is visible in the dayroom, seen watching TV and eating a snack. Presents with a flat/depressed affect and mood. Pt. was minimal with interaction. Improving today; made effort to go to dayroom.  Subjective- Denies having any SI/HI/AVH/Pain at this time. Pt. states "My main concern is just finding a place to live". Pt. remain safe on the unit.  1:1 interaction in private to establish rapport. Encouragement, education, & support given from staff. Meds. ordered and given.   Safety maintained with Q 15 checks. Continues to follow treatment plan and will monitor closely. No additonal questions/concerns noted.

## 2016-12-31 NOTE — BHH Suicide Risk Assessment (Signed)
[facility] INPATIENT:  Family/Significant Other Suicide Prevention Education  Suicide Prevention Education:  Patient Refusal for Family/Significant Other Suicide Prevention Education: The patient Isaac [name] has refused to provide written consent for family/significant other to be provided Family/Significant Other Suicide Prevention Education during admission and/or prior to discharge.  Physician notified.  Gladstone Lighter 12/31/2016, 3:26 PM

## 2016-12-31 NOTE — Progress Notes (Signed)
Pt attend wrap up group. 

## 2016-12-31 NOTE — BHH Group Notes (Signed)
[facility] LCSW Group Therapy  12/31/2016 1:17 PM  Type of Therapy:  Group Therapy  Participation Level:  Did Not Attend-pt invited. Chose to remain in bed.   Modes of Intervention:  Confrontation, Discussion, Education, Problem-solving, Socialization and Support  Summary of Progress/Problems: Feelings Around Diagnosis: Group members were asked to explore feelings about their mental health diagnosis and discuss the positives and negatives their diagnosis. Group members were asked to share how family members/social supports responded to their symptoms and diagnosis and were encouraged to identify how their support network can best assist with their recovery in a helpful and supportive way.   Isaac [name] N Smart LCSW 12/31/2016, 1:17 PM
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
## 2016-12-31 NOTE — Tx Team (Signed)
Interdisciplinary Treatment and Diagnostic Plan Update 12/31/2016 Time of Session: 9:30am  Isaac [name]  MRN: [id number]  Principal Diagnosis: Substance induced mood disorder ([place])  Secondary Diagnoses: Principal Problem:   Substance induced mood disorder ([place]) Active Problems:   Polysubstance abuse   Current Medications:  Current Facility-Administered Medications  Medication Dose Route Frequency Provider Last Rate Last Dose  . elvitegravir-cobicistat-emtricitabine-tenofovir (GENVOYA) 150-150-200-10 MG tablet 1 tablet  1 tablet Oral Q breakfast Laverle Hobby, PA-C   1 tablet at 12/31/16 0820  . lisinopril (PRINIVIL,ZESTRIL) tablet 10 mg  10 mg Oral Daily Laverle Hobby, PA-C   10 mg at 12/31/16 0820   And  . hydrochlorothiazide (MICROZIDE) capsule 12.5 mg  12.5 mg Oral Daily Laverle Hobby, PA-C   12.5 mg at 12/31/16 0820  . levETIRAcetam (KEPPRA) tablet 750 mg  750 mg Oral BID Laverle Hobby, PA-C   750 mg at 12/31/16 6256  . [START ON 01/01/2017] LORazepam (ATIVAN) tablet 1 mg  1 mg Oral Daily Laverle Hobby, PA-C      . multivitamin with minerals tablet 1 tablet  1 tablet Oral Daily Laverle Hobby, PA-C   1 tablet at 12/31/16 1153  . thiamine (B-1) injection 100 mg  100 mg Intramuscular Once [employer], PA-C      . thiamine (VITAMIN B-1) tablet 100 mg  100 mg Oral Daily Laverle Hobby, PA-C   100 mg at 12/31/16 0820  . traZODone (DESYREL) tablet 50 mg  50 mg Oral QHS,MR X 1 Laverle Hobby, PA-C   50 mg at 12/28/16 2135    PTA Medications: Prescriptions Prior to Admission  Medication Sig Dispense Refill Last Dose  . elvitegravir-cobicistat-emtricitabine-tenofovir (GENVOYA) 150-150-200-10 MG TABS tablet Take 1 tablet by mouth daily with breakfast. 90 tablet 3 12/27/2016 at Unknown time  . levETIRAcetam (KEPPRA) 750 MG tablet TAKE 1 TABLET BY MOUTH TWICE DAILY 60 tablet 5 12/27/2016 at Unknown time  . lisinopril-hydrochlorothiazide (PRINZIDE,ZESTORETIC) 10-12.5 MG  tablet TAKE 1 TABLET BY MOUTH DAILY 30 tablet 11 12/27/2016 at Unknown time  . Multiple Vitamin (MULTIVITAMIN WITH MINERALS) TABS tablet Take 1 tablet by mouth daily.   12/27/2016 at Unknown time  . loratadine (CLARITIN) 10 MG tablet Take 1 tablet (10 mg total) by mouth daily. (May purchase from over the counter at your local pharmacy): For allergies (Patient not taking: Reported on 12/28/2016)   Not Taking at Unknown time  . sildenafil (VIAGRA) 100 MG tablet Take 1 tablet (100 mg total) by mouth daily as needed for erectile dysfunction. (Patient not taking: Reported on 12/28/2016) 30 tablet 11 Not Taking at Unknown time    Treatment Modalities: Medication Management, Group therapy, Case management,  1 to 1 session with clinician, Psychoeducation, Recreational therapy.  Patient Stressors: Financial difficulties Health problems Substance abuse Patient Strengths: Capable of independent living Curator fund of knowledge Work Artist for Primary Diagnosis: Substance induced mood disorder ([place]) Long Term Goal(s): Improvement in symptoms so as ready for discharge Short Term Goals: Ability to identify changes in lifestyle to reduce recurrence of condition will improve Ability to verbalize feelings will improve Ability to disclose and discuss suicidal ideas Ability to identify and develop effective coping behaviors will improve Ability to maintain clinical measurements within normal limits will improve Compliance with prescribed medications will improve Ability to identify triggers associated with substance abuse/mental health issues will improve  Medication Management: Evaluate patient's response, side effects, and tolerance of medication  regimen.  Therapeutic Interventions: 1 to 1 sessions, Unit Group sessions and Medication administration.  Evaluation of Outcomes: Not Met  Physician Treatment Plan for Secondary Diagnosis: Principal Problem:    Substance induced mood disorder ([place]) Active Problems:   Polysubstance abuse  Long Term Goal(s): Improvement in symptoms so as ready for discharge  Short Term Goals: Ability to identify changes in lifestyle to reduce recurrence of condition will improve Ability to verbalize feelings will improve Ability to disclose and discuss suicidal ideas Ability to identify and develop effective coping behaviors will improve Ability to maintain clinical measurements within normal limits will improve Compliance with prescribed medications will improve Ability to identify triggers associated with substance abuse/mental health issues will improve  Medication Management: Evaluate patient's response, side effects, and tolerance of medication regimen.  Therapeutic Interventions: 1 to 1 sessions, Unit Group sessions and Medication administration.  Evaluation of Outcomes: Not Met  RN Treatment Plan for Primary Diagnosis: Substance induced mood disorder ([place]) Long Term Goal(s): Knowledge of disease and therapeutic regimen to maintain health will improve  Short Term Goals: Ability to verbalize feelings will improve and Compliance with prescribed medications will improve  Medication Management: RN will administer medications as ordered by provider, will assess and evaluate patient's response and provide education to patient for prescribed medication. RN will report any adverse and/or side effects to prescribing provider.  Therapeutic Interventions: 1 on 1 counseling sessions, Psychoeducation, Medication administration, Evaluate responses to treatment, Monitor vital signs and CBGs as ordered, Perform/monitor CIWA, COWS, AIMS and Fall Risk screenings as ordered, Perform wound care treatments as ordered.  Evaluation of Outcomes: Not Met  LCSW Treatment Plan for Primary Diagnosis: Substance induced mood disorder ([place]) Long Term Goal(s): Safe transition to appropriate next level of care at discharge, Engage  patient in therapeutic group addressing interpersonal concerns. Short Term Goals: Engage patient in aftercare planning with referrals and resources, Facilitate patient progression through stages of change regarding substance use diagnoses and concerns, Identify triggers associated with mental health/substance abuse issues and Increase skills for wellness and recovery  Therapeutic Interventions: Assess for all discharge needs, 1 to 1 time with Social worker, Explore available resources and support systems, Assess for adequacy in community support network, Educate family and significant other(s) on suicide prevention, Complete Psychosocial Assessment, Interpersonal group therapy.  Evaluation of Outcomes: Not Met  Progress in Treatment: Attending groups: Pt is new to milieu, continuing to assess  Participating in groups: Pt is new to milieu, continuing to assess  Taking medication as prescribed: Yes, MD continues to assess for medication changes as needed Toleration medication: Yes, no side effects reported at this time Family/Significant other contact made: No, pt declined contact Patient understands diagnosis: Continuing to assess Discussing patient identified problems/goals with staff: Yes Medical problems stabilized or resolved: Yes Denies suicidal/homicidal ideation: Yes Issues/concerns per patient self-inventory: None Other: N/A  New problem(s) identified: None identified at this time.   New Short Term/Long Term Goal(s): None identified at this time.   Discharge Plan or Barriers: CSW still assessing for an appropriate plan. Pt is declining aftercare at this time.  Reason for Continuation of Hospitalization:  Depression Medication stabilization Suicidal ideation Withdrawal symptoms  Estimated Length of Stay: 3-5 days  Attendees: Patient: 12/31/2016 3:49 PM  Physician: Dr. Parke Poisson 12/31/2016 3:49 PM  Nursing: Opal Sidles RN; Jinny Sanders, RN 12/31/2016 3:49 PM  RN Care Manager: Lars Pinks,  RN 12/31/2016 3:49 PM  Social Worker:  Matthew Saras, [place] 12/31/2016 3:49 PM  Recreational Therapist:  12/31/2016 3:49  PM  Other: Lindell Spar, NP; Samuel Jester, NP 12/31/2016 3:49 PM  Other:  12/31/2016 3:49 PM  Other: 12/31/2016 3:49 PM   Scribe for Treatment Team: Georga Kaufmann, MSW,LCSWA 12/31/2016 3:49 PM
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
## 2016-12-31 NOTE — BHH Group Notes (Signed)
[facility] LCSW Aftercare Discharge Planning Group Note   12/31/2016  8:45 AM  Participation Quality:  Pt invited. Did not attend.  Georga Kaufmann, MSW, LCSWA 12/31/2016 3:37 PM

## 2017-01-01 DIAGNOSIS — F17210 Nicotine dependence, cigarettes, uncomplicated: Secondary | ICD-10-CM

## 2017-01-01 DIAGNOSIS — B1920 Unspecified viral hepatitis C without hepatic coma: Secondary | ICD-10-CM

## 2017-01-01 DIAGNOSIS — G40909 Epilepsy, unspecified, not intractable, without status epilepticus: Secondary | ICD-10-CM

## 2017-01-01 DIAGNOSIS — B20 Human immunodeficiency virus [HIV] disease: Secondary | ICD-10-CM

## 2017-01-01 MED ORDER — MIRTAZAPINE 7.5 MG PO TABS
7.5000 mg | ORAL_TABLET | Freq: Every day | ORAL | Status: DC
  Administered 2017-01-01 – 2017-01-02 (×2): 7.5 mg via ORAL
  Filled 2017-01-01 (×3): qty 1
  Filled 2017-01-01: qty 7
  Filled 2017-01-01 (×2): qty 1

## 2017-01-01 NOTE — Progress Notes (Addendum)
Data. Patient denies SI/HI/AVH. Patient has been in bed for much of the day, isolating in his room. When patient does get up he reports, "I am still so very depressed. I don't look forward to anything." Patient is irritable, with flat, anxious affect and short, clipped speech. He is also very fidgety when interacting with staff. Patient did state that he is anxious about where he is going to live when he leaves.  Action. Emotional support and encouragement offered. Education provided on medication, indications and side effect. Q 15 minute checks done for safety. Response. Safety on the unit maintained through 15 minute checks.  Medications taken as prescribed. Remained calm and appropriate through out shift.

## 2017-01-01 NOTE — Plan of Care (Signed)
Problem: Activity: Goal: Imbalance in normal sleep/wake cycle will improve Outcome: Progressing Patient reports, "I didn't sleep at all last night." He did sleep late in the morning, but spent the afternoon out of bed in the common room.

## 2017-01-01 NOTE — BHH Group Notes (Signed)
[facility] Group Notes:  (Nursing/MHT/Case Management/Adjunct)  Date:  01/01/2017  Time:  2:40 PM  Type of Therapy:  Nurse Education  Participation Level:  Did Not Lennette Bihari 01/01/2017, 2:40 PM

## 2017-01-01 NOTE — BHH Group Notes (Signed)
[facility] Group Notes: (Clinical Social Work)   01/01/2017      Type of Therapy:  Group Therapy   Participation Level:  Did Not Attend despite MHT prompting   Selmer Dominion, LCSW 01/01/2017, 12:00 PM

## 2017-01-01 NOTE — Progress Notes (Signed)
Pt attend wrap up group. His day was a 6. Pt said he feels depress today. His mind mood and emotions are all over the place. He do not want to loose his mind due to his situaiions. He need a place to stay when he leaves here. A place he want to feel comfortable to live. He do not have support from family and friends. This is why he feel alone. Pt said he really in need help from social worker here.

## 2017-01-01 NOTE — Plan of Care (Signed)
Problem: Activity: Goal: Interest or engagement in leisure activities will improve Outcome: Progressing Pt. made effort this evening to sit in dayroom. Pt. is seen interacting with peers and watching TV.

## 2017-01-01 NOTE — Progress Notes (Signed)
DATA ACTION RESPONSE  Objective-Pt. is visible in the dayroom, seen watching TV and interacting with peers. Presents with a depressed affect and mood. Improving,  made effort to go to dayroom. Appears to be minimizing s/s.  Subjective-Denies having any SI/HI/AVH/Pain at this time. Pt. states "Trazodone makes me have bad nightmares".  Pt.remain safe on the unit.  1:1 interaction in private to establish rapport. Encouragement, education, &support given from staff. NP contacted. Trazodone d/c and Remeron 7.5 mg started tonight.  Safety maintained with Q 15 checks. Continues to follow treatment plan and will monitor closely. No additonal questions/concerns noted.
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
## 2017-01-02 NOTE — B[facility] Group Notes (Signed)
[place] Group Notes:  (Nursing/MHT/Case Management/Adjunct)  Date:  01/02/2017  Time:  4:33 PM  Type of Therapy:  Nurse Education  Participation Level:  Active  Participation Quality:  Monopolizing  Affect:  Appropriate  Cognitive:  Appropriate  Insight:  Good  Engagement in Group:  Engaged and Monopolizing  Modes of Intervention:  Discussion and Education  Summary of Progress/Problems:  This was a goal setting and coping skills group.  Cheri Kearns 01/02/2017, 4:33 PM

## 2017-01-02 NOTE — Plan of Care (Signed)
Problem: Education: Goal: Ability to make informed decisions regarding treatment will improve Outcome: Progressing Patient reports, "I am really ready to take control and stop doing drugs and drinking."

## 2017-01-02 NOTE — Progress Notes (Signed)
Patient attended wrap-up group and we used the group activity ball.   He was asked,  if he could visit anywhere, where would it be?  He said, it will have to be visiting last week, where he will have the opportunity to make better decisions.

## 2017-01-02 NOTE — Progress Notes (Signed)
Data. Patient denies SI/HI/AVH. Patient spent the morning in bed and did not get up for meds or groups.  In the afternoon patient started to open up about his history with drugs and alcohol. He expressed desire to stay sober, but how he, "Hears the beer calling my name. I know I have to not listen, but I still do it anyway." patient was adamant and tearful.  Action. Emotional support and encouragement offered. Education provided on medication, indications and side effect. Q 15 minute checks done for safety. Response. Safety on the unit maintained through 15 minute checks.  Medications taken as prescribed. Attended groups. Remained calm and appropriate through out shift.
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
## 2017-01-02 NOTE — B[facility] Group Notes (Signed)
[facility] Group Notes: (Clinical Social Work)   01/02/2017      Type of Therapy:  Group Therapy   Participation Level:  Did Not Attend despite MHT prompting   Selmer Dominion, LCSW 01/02/2017, 12:28 PM

## 2017-01-02 NOTE — [facility] Group Notes (Signed)
Healthy Support Systems   Date:  01/02/2017  Time:  1300  Type of Therapy:  Nurse Education  /  Healthy Support Systems :  The group focuses on teaching patients how to identify their unhealthy behaviors and how to establish and utilize [employer].  Participation Level:  Did Not Attend  Participation Quality:    Affect:    Cognitive:    Insight:    Engagement in Group:    Modes of Intervention:    Summary of Progress/Problems:  Lauralyn Primes 01/02/2017, 3:17 PM

## 2017-01-03 MED ORDER — MIRTAZAPINE 7.5 MG PO TABS: 7.5000 mg | ORAL_TABLET | Freq: Every day | ORAL | 0 refills | Status: DC

## 2017-01-03 MED ORDER — ADULT MULTIVITAMIN W/MINERALS CH: 1.0000 | ORAL_TABLET | Freq: Every day | ORAL | 0 refills | Status: DC

## 2017-01-03 NOTE — Progress Notes (Signed)
Discharge Note:  Patient discharged with girlfriend.  Patient denied SI and HI.  Denied A/V hallucinations.  Suicide prevention information given and discussed with patient who stated he understood and had no questions.  Patient stated he received all his belongings, clothing, belt, medications, prescriptions, sunglasses, clippers, shoes, cards, toiletries, etc.  Patient stated he appreciated all assistance received from [facility] staff.  All required discharge information given to patient at discharge.

## 2017-01-03 NOTE — Progress Notes (Signed)
Pt who was initially uncomfortable with his roommate was seen laughing and have a meaning conversation with roommate; states, "I guess we can't judge a book by its cover; I am fine staying here with him." Pt and roommate could be loud during their conversations.

## 2017-01-03 NOTE — Progress Notes (Signed)
D:  Patient has not filled out self inventory form.  Patient has been in bed this morning.  Patient denied SI and HI.  Contracts for safety.  Denied A/V hallucinations. A:  Medications administered per MD orders.  Emotional support and encouragement given patient. R:  Safety maintained with 15 minute checks.
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
## 2017-01-03 NOTE — Progress Notes (Signed)
  [facility] Adult Case Management Discharge Plan :  Will you be returning to the same living situation after discharge:  Yes,  pt returning to [place]. At discharge, do you have transportation home?: Yes,  pt's girlfriend provided transportation. Do you have the ability to pay for your medications: Yes,  prescriptions and samples provided.  Release of information consent forms completed and in the chart;  Patient's signature needed at discharge.  Patient to Follow up at: Follow-up Information    Pt declines referral for outpatient services Follow up.           Next level of care provider has access to [place] and Suicide Prevention discussed: Yes,  with pt.  Have you used any form of tobacco in the last 30 days? (Cigarettes, Smokeless Tobacco, Cigars, and/or Pipes): Patient Refused Screening  Has patient been referred to the Quitline?: Patient refused referral  Patient has been referred for addiction treatment: Pt. refused referral   Pt refused follow up referrals. However, pt was informed that he should return to the hospital if he is ever in a mental health crisis again.  Georga Kaufmann, MSW, LCSWA  01/03/2017, 4:42 PM

## 2017-01-03 NOTE — Progress Notes (Signed)
Recreation Therapy Notes  Date: 01/03/17 Time: 0930 Location: 400 Hall Dayroom  Group Topic: Stress Management  Goal Area(s) Addresses:  Patient will verbalize importance of using healthy stress management.  Patient will identify positive emotions associated with healthy stress management.   Intervention: Stress Management  Activity :  Progressive Muscle Relaxation.  LRT read a script to guide patients through the stress management technique of progressive muscle relaxation.  Patients were to follow along as the script was read to engage in the technique.  Education:  Stress Management, Discharge Planning.   Education Outcome: Acknowledges edcuation/In group clarification offered/Needs additional education  Clinical Observations/Feedback:  Pt did not attend group.   Victorino Sparrow, LRT/CTRS         Victorino Sparrow A 01/03/2017 12:31 PM
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
## 2017-01-09 ENCOUNTER — Encounter (HOSPITAL_COMMUNITY): Payer: Self-pay

## 2017-01-09 ENCOUNTER — Ambulatory Visit (HOSPITAL_COMMUNITY)
Admission: EM | Admit: 2017-01-09 | Discharge: 2017-01-09 | Disposition: A | Discharge status: Home / Self Care | Payer: Self-pay | Attending: Internal Medicine | Admitting: Internal Medicine

## 2017-01-09 DIAGNOSIS — K047 Periapical abscess without sinus: Secondary | ICD-10-CM

## 2017-01-09 MED ORDER — HYDROCODONE-ACETAMINOPHEN 5-325 MG PO TABS: 1.0000 | ORAL_TABLET | Freq: Four times a day (QID) | ORAL | 0 refills | Status: DC | PRN

## 2017-01-09 MED ORDER — CLINDAMYCIN HCL 300 MG PO CAPS: 300.0000 mg | ORAL_CAPSULE | Freq: Three times a day (TID) | ORAL | 0 refills | Status: DC

## 2017-01-09 NOTE — ED Triage Notes (Signed)
Pt has a infection in his mouth and having discharge, foul odor and pain for [redacted] week along with swelling. And knows he need a rx for amoxicillin until his root canal the 23rd of this month. Has tried using peppermint oil. No fever.

## 2017-01-09 NOTE — ED Notes (Signed)
Pharmacy called stating pt can't afford clindamycin so they want to change it. Per [facility] change it to keflex 500mg  4 times a day for 7 days. Gave verbal rx to pharmacist

## 2017-01-09 NOTE — Discharge Instructions (Signed)
For your dental abscess, I prescribed an antibiotic called clindamycin, take one tablet 3 times a day for one week.I have also prescribed a medicine for pain called hydrocodone, this medicine is a narcotic, it will cause drowsiness, and it is addictive. Do not take more than what is necessary, do not drink alcohol while taking, and do not operate any heavy machinery while taking this medicine. These medicines will help you with your symptoms, however they will not be curative. You will need to follow-up with a dentist for definitive treatment and care.

## 2017-01-09 NOTE — ED Notes (Signed)
Pt   Checked   On  And  Asked  If  He  Needed   Anything  Pt  Talking  Loudly  On  cellphone

## 2017-01-09 NOTE — ED Provider Notes (Signed)
CSN: [id number]     Arrival date & time 01/09/17  1400 History   First MD Initiated Contact with Patient 01/09/17 1613     Chief Complaint  Patient presents with  . Dental Pain   (Consider location/radiation/quality/duration/timing/severity/associated sxs/prior Treatment) 56 year old male with past medical history of hepatitis C, HIV, hypertension, and immune deficiency disorder presents to clinic with a chief complaint of a dental abscess. States he has an appointment with his dentist scheduled on May 24, however is been getting worse over the past few days, has swollen, and has had some drainage. Denies any fever, chills, nausea, or other markers of systemic illness. No sore throat, no difficulty swallowing. Pain is at the #8 tooth, aching, pulsating, pressure that is severe, worsened with cold temperatures, and pressure. He is taken over-the-counter medicines with no relief.   The history is provided by the patient.  Dental Pain  Location:  Upper Associated symptoms: no congestion     Past Medical History:  Diagnosis Date  . Alcoholism ([place])   . AVM (arteriovenous malformation) brain   . Hepatitis C   . Hepatitis C infection   . HIV (human immunodeficiency virus infection) ([place])   . HIV (human immunodeficiency virus infection) ([place])    Follows with Dr. Johnnye Sima   . Hypertension   . Immune deficiency disorder ([place])   . Seizure disorder ([place])   . Seizures ([place])    History reviewed. No pertinent surgical history. No family history on file. Social History  Substance Use Topics  . Smoking status: Current Every Day Smoker    Types: Cigarettes    Start date: 10/28/2011  . Smokeless tobacco: Never Used  . Alcohol use 2.4 oz/week    4 Cans of beer per week     Comment: none since 12/2014    Review of Systems  Constitutional: Negative.   HENT: Positive for dental problem. Negative for congestion, sinus pain, sinus pressure, sore throat and trouble swallowing.   Eyes: Negative.     Respiratory: Negative.   Cardiovascular: Negative.   Gastrointestinal: Negative.   Musculoskeletal: Negative.   Skin: Negative.   Neurological: Negative.     Allergies  Patient has no known allergies.  Home Medications   Prior to Admission medications   Medication Sig Start Date End Date Taking? Authorizing Provider  elvitegravir-cobicistat-emtricitabine-tenofovir (GENVOYA) 150-150-200-10 MG TABS tablet Take 1 tablet by mouth daily with breakfast. 06/02/16  Yes Campbell Riches, MD  levETIRAcetam (KEPPRA) 750 MG tablet TAKE 1 TABLET BY MOUTH TWICE DAILY 07/02/16  Yes Campbell Riches, MD  mirtazapine (REMERON) 7.5 MG tablet Take 1 tablet (7.5 mg total) by mouth at bedtime. 01/03/17  Yes Kerrie Buffalo, NP  clindamycin (CLEOCIN) 300 MG capsule Take 1 capsule (300 mg total) by mouth 3 (three) times daily. 01/09/17   Barnet Glasgow, NP  HYDROcodone-acetaminophen (NORCO/VICODIN) 5-325 MG tablet Take 1 tablet by mouth every 6 (six) hours as needed. 01/09/17   Barnet Glasgow, NP  lisinopril-hydrochlorothiazide (PRINZIDE,ZESTORETIC) 10-12.5 MG tablet TAKE 1 TABLET BY MOUTH DAILY 08/13/16   Campbell Riches, MD  Multiple Vitamin (MULTIVITAMIN WITH MINERALS) TABS tablet Take 1 tablet by mouth daily. 01/03/17   Kerrie Buffalo, NP   Meds Ordered and Administered this Visit  Medications - No data to display  BP 125/75 (BP Location: Right Arm)   Pulse 90   Temp 98 F (36.7 C) (Oral)   Resp 20   SpO2 96%  No data found.   Physical Exam  Constitutional:  He is oriented to person, place, and time. He appears well-developed and well-nourished. No distress.  HENT:  Head: Normocephalic and atraumatic.  Right Ear: External ear normal.  Left Ear: External ear normal.  Nose: Nose normal.  Mouth/Throat: Oropharynx is clear and moist and mucous membranes are normal. Dental abscesses present. Tonsils are 1+ on the right. Tonsils are 1+ on the left.    Eyes: Conjunctivae are normal.  Neck:  Normal range of motion.  Neurological: He is alert and oriented to person, place, and time.  Skin: Skin is warm and dry. Capillary refill takes less than 2 seconds. He is not diaphoretic.  Psychiatric: He has a normal mood and affect. His behavior is normal.  Nursing note and vitals reviewed.   Urgent Care Course     Procedures (including critical care time)  Labs Review Labs Reviewed - No data to display  Imaging Review No results found.    MDM   1. Dental abscess    Given prescription for clindamycin for abscess, hydrocodone for pain, strongly encouraged to keep his appointment with his dentist, advised that the pain would return, in the abscess would worsen without definitive treatment from a dentist.    Barnet Glasgow, NP 01/09/17 1627

## 2017-01-12 ENCOUNTER — Other Ambulatory Visit (INDEPENDENT_AMBULATORY_CARE_PROVIDER_SITE_OTHER): Payer: Self-pay

## 2017-01-12 DIAGNOSIS — Z79899 Other long term (current) drug therapy: Secondary | ICD-10-CM

## 2017-01-12 DIAGNOSIS — Z113 Encounter for screening for infections with a predominantly sexual mode of transmission: Secondary | ICD-10-CM

## 2017-01-12 DIAGNOSIS — B20 Human immunodeficiency virus [HIV] disease: Secondary | ICD-10-CM

## 2017-01-12 LAB — COMPREHENSIVE METABOLIC PANEL
ALBUMIN: 4.1 g/dL (ref 3.6–5.1)
ALT: 19 U/L (ref 9–46)
AST: 25 U/L (ref 10–35)
Alkaline Phosphatase: 48 U/L (ref 40–115)
BILIRUBIN TOTAL: 0.4 mg/dL (ref 0.2–1.2)
BUN: 20 mg/dL (ref 7–25)
CO2: 23 mmol/L (ref 20–31)
Calcium: 8.9 mg/dL (ref 8.6–10.3)
Chloride: 107 mmol/L (ref 98–110)
Creat: 1.12 mg/dL (ref 0.70–1.33)
Glucose, Bld: 78 mg/dL (ref 65–99)
Potassium: 3.8 mmol/L (ref 3.5–5.3)
SODIUM: 140 mmol/L (ref 135–146)
TOTAL PROTEIN: 7.2 g/dL (ref 6.1–8.1)

## 2017-01-12 LAB — LIPID PANEL
CHOLESTEROL: 193 mg/dL (ref <200)
HDL: 38 mg/dL — ABNORMAL LOW (ref >40)
LDL CALC: 140 mg/dL — AB (ref <100)
TRIGLYCERIDES: 74 mg/dL (ref <150)
Total CHOL/HDL Ratio: 5.1 Ratio — ABNORMAL HIGH (ref <5.0)
VLDL: 15 mg/dL (ref <30)

## 2017-01-12 LAB — CBC
HCT: 36.4 % — ABNORMAL LOW (ref 38.5–50.0)
HEMOGLOBIN: 11.9 g/dL — AB (ref 13.2–17.1)
MCH: 28.4 pg (ref 27.0–33.0)
MCHC: 32.7 g/dL (ref 32.0–36.0)
MCV: 86.9 fL (ref 80.0–100.0)
MPV: 7.8 fL (ref 7.5–12.5)
Platelets: 315 10*3/uL (ref 140–400)
RBC: 4.19 MIL/uL — ABNORMAL LOW (ref 4.20–5.80)
RDW: 13.3 % (ref 11.0–15.0)
WBC: 6.2 10*3/uL (ref 3.8–10.8)

## 2017-01-13 LAB — URINE CYTOLOGY ANCILLARY ONLY
CHLAMYDIA, DNA PROBE: NEGATIVE
Neisseria Gonorrhea: NEGATIVE

## 2017-01-13 LAB — T-HELPER CELL (CD4) - (RCID CLINIC ONLY)
CD4 % Helper T Cell: 23 % — ABNORMAL LOW (ref 33–55)
CD4 T Cell Abs: 640 /uL (ref 400–2700)

## 2017-01-13 LAB — RPR

## 2017-01-14 LAB — HIV-1 RNA QUANT-NO REFLEX-BLD
HIV 1 RNA Quant: <20 copies/mL — AB
HIV-1 RNA QUANT, LOG: DETECTED {Log_copies}/mL — AB

## 2017-01-26 ENCOUNTER — Ambulatory Visit (INDEPENDENT_AMBULATORY_CARE_PROVIDER_SITE_OTHER): Payer: Self-pay | Admitting: Infectious Diseases

## 2017-01-26 ENCOUNTER — Encounter: Payer: Self-pay | Admitting: Infectious Diseases

## 2017-01-26 VITALS — BP 154/96 | HR 72 | Temp 98.5°F | Ht 69.0 in | Wt 165.0 lb

## 2017-01-26 DIAGNOSIS — F1014 Alcohol abuse with alcohol-induced mood disorder: Secondary | ICD-10-CM

## 2017-01-26 DIAGNOSIS — I10 Essential (primary) hypertension: Secondary | ICD-10-CM

## 2017-01-26 DIAGNOSIS — Z79899 Other long term (current) drug therapy: Secondary | ICD-10-CM

## 2017-01-26 DIAGNOSIS — Z113 Encounter for screening for infections with a predominantly sexual mode of transmission: Secondary | ICD-10-CM

## 2017-01-26 DIAGNOSIS — B20 Human immunodeficiency virus [HIV] disease: Secondary | ICD-10-CM

## 2017-01-26 DIAGNOSIS — F1414 Cocaine abuse with cocaine-induced mood disorder: Secondary | ICD-10-CM

## 2017-01-26 DIAGNOSIS — B370 Candidal stomatitis: Secondary | ICD-10-CM

## 2017-01-26 DIAGNOSIS — B3781 Candidal esophagitis: Secondary | ICD-10-CM

## 2017-01-26 DIAGNOSIS — R569 Unspecified convulsions: Secondary | ICD-10-CM

## 2017-01-26 MED ORDER — FLUCONAZOLE 100 MG PO TABS: 100.0000 mg | ORAL_TABLET | Freq: Every day | ORAL | 0 refills | Status: DC

## 2017-01-26 NOTE — Assessment & Plan Note (Signed)
Suspect elevated today due to work stress, ? Drug use.

## 2017-01-26 NOTE — Assessment & Plan Note (Signed)
Will have him meet with sherry today.

## 2017-01-26 NOTE — Assessment & Plan Note (Signed)
He has condoms Taking meds well We reviewed his meds He has aged out of mening  vzvax in future.  rtc in 6 months

## 2017-01-26 NOTE — Assessment & Plan Note (Signed)
Will have him meet with sherry today

## 2017-01-26 NOTE — Progress Notes (Signed)
   Subjective:    Patient ID: Isaac [name], male    DOB: March 13, 1961, 56 y.o.   MRN: [id number]  HPI 56 yo M with hx of HIV+, Hep C (1a F0/F1 [11-2014]) treated and completed 03-2016, seizure d/o, 10-2014 hospitalized for substance abuse at [facility] (ETOH, drug addiction).  Has been on atripl --> descovey/tivicay.  Was hospitalized last year with AVM and seizures.  Hospitalized last month with subs abuse mental health issues (cocaine, ETOH).   Is worried as his wt has dropped 6-8#. (we reviewed his prev wts in computer).  Also has thrush and angular chelitis for last month.  States he has been on anbx recently. Due to dental infection.  Denies missed meds.  Continues on keppra- had seizure like episode Feb, no aura. EMS came and eval him and he defered ED visit. Has not been able to make f/u due to work, $.   HIV 1 RNA Quant (copies/mL)  Date Value  01/12/2017 <20 DETECTED (A)  05/11/2016 <20  11/12/2015 <20   CD4 T Cell Abs (/uL)  Date Value  01/12/2017 640  05/11/2016 370 (L)  11/12/2015 690    Review of Systems  Constitutional: Negative for appetite change and unexpected weight change.  Respiratory: Negative for cough and shortness of breath.   Cardiovascular: Negative for chest pain and leg swelling.  Gastrointestinal: Negative for constipation and diarrhea.  Genitourinary: Negative for difficulty urinating.  Neurological: Positive for seizures and headaches.       Objective:   Physical Exam  Constitutional: He appears well-developed and well-nourished.  HENT:  Mouth/Throat: Abnormal dentition. Oropharyngeal exudate present.  Eyes: EOM are normal. Pupils are equal, round, and reactive to light.  Neck: Neck supple.  Cardiovascular: Normal rate, regular rhythm and normal heart sounds.   Pulmonary/Chest: Effort normal and breath sounds normal.  Abdominal: Soft. Bowel sounds are normal. There is no tenderness. There is no rebound.  Musculoskeletal: He exhibits no  edema.  Lymphadenopathy:    He has no cervical adenopathy.      Assessment & Plan:

## 2017-01-26 NOTE — Assessment & Plan Note (Signed)
Will f/u with neuro when he has time and funds.

## 2017-01-27 ENCOUNTER — Other Ambulatory Visit (INDEPENDENT_AMBULATORY_CARE_PROVIDER_SITE_OTHER): Payer: Self-pay

## 2017-01-27 DIAGNOSIS — B20 Human immunodeficiency virus [HIV] disease: Secondary | ICD-10-CM

## 2017-01-28 LAB — T-HELPER CELL (CD4) - (RCID CLINIC ONLY)
CD4 T CELL HELPER: 24 % — AB (ref 33–55)
CD4 T Cell Abs: 630 /uL (ref 400–2700)

## 2017-02-02 LAB — HIV-1 RNA QUANT-NO REFLEX-BLD
HIV 1 RNA QUANT: DETECTED {copies}/mL — AB
HIV-1 RNA Quant, Log: <1.3 Log copies/mL — AB

## 2017-02-04 ENCOUNTER — Telehealth: Payer: Self-pay | Admitting: *Deleted

## 2017-02-04 NOTE — Telephone Encounter (Signed)
Patient called for the results of his labs. Advised him that his viral load is undetectable and his CD4 is 630. He has a follow up lab appt and MD appt in November. Myrtis Hopping

## 2017-02-05 ENCOUNTER — Other Ambulatory Visit: Payer: Self-pay | Admitting: Infectious Diseases

## 2017-02-05 DIAGNOSIS — R569 Unspecified convulsions: Secondary | ICD-10-CM
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
## 2017-06-03 ENCOUNTER — Encounter: Payer: Self-pay | Admitting: Pharmacist

## 2017-06-03 ENCOUNTER — Other Ambulatory Visit: Payer: Self-pay | Admitting: Pharmacist

## 2017-06-03 DIAGNOSIS — I10 Essential (primary) hypertension: Secondary | ICD-10-CM

## 2017-06-03 MED ORDER — BENAZEPRIL-HYDROCHLOROTHIAZIDE 10-12.5 MG PO TABS: 1.0000 | ORAL_TABLET | Freq: Every day | ORAL | 11 refills | Status: DC

## 2017-06-03 NOTE — Progress Notes (Signed)
  Received fax from [facility] that lisinopril-HCTZ was not on their ADAP formuary.  Changed patient to benazepril-HCTZ instead.

## 2017-07-04 NOTE — Telephone Encounter (Signed)
No documentation needed

## 2017-07-05 ENCOUNTER — Other Ambulatory Visit: Payer: Self-pay | Admitting: Infectious Diseases

## 2017-07-05 DIAGNOSIS — B20 Human immunodeficiency virus [HIV] disease: Secondary | ICD-10-CM

## 2017-07-18 ENCOUNTER — Other Ambulatory Visit: Payer: Self-pay

## 2017-08-01 ENCOUNTER — Ambulatory Visit: Payer: Self-pay | Admitting: Infectious Diseases

## 2017-08-19 ENCOUNTER — Encounter (HOSPITAL_COMMUNITY): Payer: Self-pay | Admitting: *Deleted

## 2017-08-19 ENCOUNTER — Inpatient Hospital Stay (HOSPITAL_COMMUNITY)
Admission: RE | Admit: 2017-08-19 | Discharge: 2017-08-26 | DRG: 885 | Disposition: A | Discharge status: Home / Self Care | Payer: Federal, State, Local not specified - Other | Attending: Psychiatry | Admitting: Psychiatry

## 2017-08-19 ENCOUNTER — Other Ambulatory Visit: Payer: Self-pay

## 2017-08-19 DIAGNOSIS — F4010 Social phobia, unspecified: Secondary | ICD-10-CM | POA: Diagnosis not present

## 2017-08-19 DIAGNOSIS — Z87828 Personal history of other (healed) physical injury and trauma: Secondary | ICD-10-CM

## 2017-08-19 DIAGNOSIS — F1910 Other psychoactive substance abuse, uncomplicated: Secondary | ICD-10-CM | POA: Diagnosis not present

## 2017-08-19 DIAGNOSIS — F1010 Alcohol abuse, uncomplicated: Secondary | ICD-10-CM | POA: Diagnosis present

## 2017-08-19 DIAGNOSIS — F1410 Cocaine abuse, uncomplicated: Secondary | ICD-10-CM | POA: Diagnosis present

## 2017-08-19 DIAGNOSIS — F419 Anxiety disorder, unspecified: Secondary | ICD-10-CM | POA: Diagnosis present

## 2017-08-19 DIAGNOSIS — G40909 Epilepsy, unspecified, not intractable, without status epilepticus: Secondary | ICD-10-CM | POA: Diagnosis present

## 2017-08-19 DIAGNOSIS — R45851 Suicidal ideations: Secondary | ICD-10-CM | POA: Diagnosis present

## 2017-08-19 DIAGNOSIS — F332 Major depressive disorder, recurrent severe without psychotic features: Secondary | ICD-10-CM | POA: Diagnosis not present

## 2017-08-19 DIAGNOSIS — Z915 Personal history of self-harm: Secondary | ICD-10-CM

## 2017-08-19 DIAGNOSIS — F1020 Alcohol dependence, uncomplicated: Secondary | ICD-10-CM | POA: Diagnosis present

## 2017-08-19 DIAGNOSIS — Z8619 Personal history of other infectious and parasitic diseases: Secondary | ICD-10-CM | POA: Diagnosis not present

## 2017-08-19 DIAGNOSIS — F410 Panic disorder [episodic paroxysmal anxiety] without agoraphobia: Secondary | ICD-10-CM | POA: Diagnosis not present

## 2017-08-19 DIAGNOSIS — Z79899 Other long term (current) drug therapy: Secondary | ICD-10-CM

## 2017-08-19 DIAGNOSIS — G4700 Insomnia, unspecified: Secondary | ICD-10-CM | POA: Diagnosis present

## 2017-08-19 DIAGNOSIS — R450 Nervousness: Secondary | ICD-10-CM | POA: Diagnosis not present

## 2017-08-19 DIAGNOSIS — I10 Essential (primary) hypertension: Secondary | ICD-10-CM | POA: Diagnosis present

## 2017-08-19 DIAGNOSIS — Z560 Unemployment, unspecified: Secondary | ICD-10-CM | POA: Diagnosis not present

## 2017-08-19 DIAGNOSIS — Z590 Homelessness: Secondary | ICD-10-CM

## 2017-08-19 DIAGNOSIS — R569 Unspecified convulsions: Secondary | ICD-10-CM

## 2017-08-19 DIAGNOSIS — B20 Human immunodeficiency virus [HIV] disease: Secondary | ICD-10-CM | POA: Diagnosis present

## 2017-08-19 DIAGNOSIS — F17210 Nicotine dependence, cigarettes, uncomplicated: Secondary | ICD-10-CM | POA: Diagnosis present

## 2017-08-19 MED ORDER — ADULT MULTIVITAMIN W/MINERALS CH
1.0000 | ORAL_TABLET | Freq: Every day | ORAL | Status: DC
  Administered 2017-08-20 – 2017-08-26 (×7): 1 via ORAL
  Filled 2017-08-19 (×11): qty 1

## 2017-08-19 MED ORDER — LORAZEPAM 1 MG PO TABS
1.0000 mg | ORAL_TABLET | Freq: Every day | ORAL | Status: AC
  Administered 2017-08-23: 1 mg via ORAL
  Filled 2017-08-19: qty 1

## 2017-08-19 MED ORDER — ACETAMINOPHEN 325 MG PO TABS
650.0000 mg | ORAL_TABLET | Freq: Four times a day (QID) | ORAL | Status: DC | PRN
  Administered 2017-08-24 – 2017-08-26 (×5): 650 mg via ORAL
  Filled 2017-08-19 (×5): qty 2

## 2017-08-19 MED ORDER — INFLUENZA VAC SPLIT QUAD 0.5 ML IM SUSY
0.5000 mL | PREFILLED_SYRINGE | INTRAMUSCULAR | Status: DC
  Filled 2017-08-19: qty 0.5

## 2017-08-19 MED ORDER — LORAZEPAM 1 MG PO TABS
1.0000 mg | ORAL_TABLET | Freq: Two times a day (BID) | ORAL | Status: AC
  Administered 2017-08-22 (×2): 1 mg via ORAL
  Filled 2017-08-19 (×2): qty 1

## 2017-08-19 MED ORDER — ELVITEG-COBIC-EMTRICIT-TENOFAF 150-150-200-10 MG PO TABS
1.0000 | ORAL_TABLET | Freq: Every day | ORAL | Status: DC
  Administered 2017-08-20 – 2017-08-26 (×7): 1 via ORAL
  Filled 2017-08-19 (×10): qty 1

## 2017-08-19 MED ORDER — LORAZEPAM 1 MG PO TABS
1.0000 mg | ORAL_TABLET | Freq: Four times a day (QID) | ORAL | Status: AC | PRN
  Filled 2017-08-19: qty 1

## 2017-08-19 MED ORDER — THIAMINE HCL 100 MG/ML IJ SOLN: 100.0000 mg | Freq: Once | INTRAMUSCULAR | Status: DC

## 2017-08-19 MED ORDER — MAGNESIUM HYDROXIDE 400 MG/5ML PO SUSP: 30.0000 mL | Freq: Every day | ORAL | Status: DC | PRN

## 2017-08-19 MED ORDER — VITAMIN B-1 100 MG PO TABS
100.0000 mg | ORAL_TABLET | Freq: Every day | ORAL | Status: DC
  Administered 2017-08-20 – 2017-08-26 (×7): 100 mg via ORAL
  Filled 2017-08-19 (×10): qty 1

## 2017-08-19 MED ORDER — MIRTAZAPINE 7.5 MG PO TABS
7.5000 mg | ORAL_TABLET | Freq: Every day | ORAL | Status: DC
  Administered 2017-08-20 – 2017-08-23 (×3): 7.5 mg via ORAL
  Filled 2017-08-19 (×8): qty 1

## 2017-08-19 MED ORDER — LORAZEPAM 1 MG PO TABS
1.0000 mg | ORAL_TABLET | Freq: Three times a day (TID) | ORAL | Status: AC
  Administered 2017-08-21 (×3): 1 mg via ORAL
  Filled 2017-08-19 (×2): qty 1

## 2017-08-19 MED ORDER — LEVETIRACETAM 250 MG PO TABS
750.0000 mg | ORAL_TABLET | Freq: Two times a day (BID) | ORAL | Status: DC
  Administered 2017-08-20 – 2017-08-26 (×13): 750 mg via ORAL
  Filled 2017-08-19 (×3): qty 1
  Filled 2017-08-19: qty 3
  Filled 2017-08-19 (×7): qty 1
  Filled 2017-08-19: qty 18
  Filled 2017-08-19 (×4): qty 1
  Filled 2017-08-19: qty 18
  Filled 2017-08-19 (×2): qty 1

## 2017-08-19 MED ORDER — ALUM & MAG HYDROXIDE-SIMETH 200-200-20 MG/5ML PO SUSP: 30.0000 mL | ORAL | Status: DC | PRN

## 2017-08-19 MED ORDER — BENAZEPRIL HCL 10 MG PO TABS
10.0000 mg | ORAL_TABLET | Freq: Every day | ORAL | Status: DC
  Administered 2017-08-20 – 2017-08-26 (×7): 10 mg via ORAL
  Filled 2017-08-19 (×5): qty 1
  Filled 2017-08-19: qty 7
  Filled 2017-08-19 (×5): qty 1

## 2017-08-19 MED ORDER — BENAZEPRIL-HYDROCHLOROTHIAZIDE 10-12.5 MG PO TABS: 1.0000 | ORAL_TABLET | Freq: Every day | ORAL | Status: DC

## 2017-08-19 MED ORDER — ONDANSETRON 4 MG PO TBDP: 4.0000 mg | ORAL_TABLET | Freq: Four times a day (QID) | ORAL | Status: AC | PRN

## 2017-08-19 MED ORDER — LORAZEPAM 1 MG PO TABS
1.0000 mg | ORAL_TABLET | Freq: Four times a day (QID) | ORAL | Status: AC
  Administered 2017-08-19 – 2017-08-20 (×5): 1 mg via ORAL
  Filled 2017-08-19 (×6): qty 1

## 2017-08-19 MED ORDER — ENSURE ENLIVE PO LIQD
237.0000 mL | Freq: Two times a day (BID) | ORAL | Status: DC
  Administered 2017-08-20 – 2017-08-26 (×8): 237 mL via ORAL

## 2017-08-19 MED ORDER — LOPERAMIDE HCL 2 MG PO CAPS: 2.0000 mg | ORAL_CAPSULE | ORAL | Status: AC | PRN

## 2017-08-19 MED ORDER — HYDROCHLOROTHIAZIDE 12.5 MG PO CAPS
12.5000 mg | ORAL_CAPSULE | Freq: Every day | ORAL | Status: DC
  Administered 2017-08-20 – 2017-08-26 (×7): 12.5 mg via ORAL
  Filled 2017-08-19 (×11): qty 1

## 2017-08-19 MED ORDER — HYDROXYZINE HCL 25 MG PO TABS
25.0000 mg | ORAL_TABLET | Freq: Four times a day (QID) | ORAL | Status: AC | PRN
  Administered 2017-08-20: 25 mg via ORAL
  Filled 2017-08-19 (×2): qty 1
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
## 2017-08-19 NOTE — Progress Notes (Signed)
D    Pt has been in bed mostly sleeping this evening   Pt was prompted to come get his medication three times and said he would come but then goes back to sleep    A   Verbal support offered    Q 15 min checks   Medications offered  R   Pt remains safe and will continue to monitor

## 2017-08-19 NOTE — H&P (Signed)
Bono Screening Exam  Isaac [name] is an 56 y.o. male patient presents to [facility] as walk-in with complaints of worsening depression, passive suicidal ideation, and alcohol and cocaine abuse.  Patient unable to contract for safety  Total Time spent with patient: 45 minutes  Psychiatric Specialty Exam: Physical Exam  Constitutional: He is oriented to person, place, and time.  HENT:  Head: Normocephalic.  Neck: Normal range of motion. Neck supple.  Cardiovascular: Normal rate.  Respiratory: Effort normal and breath sounds normal.  Musculoskeletal: Normal range of motion.  Neurological: He is alert and oriented to person, place, and time.  Skin: Skin is warm and dry.  Psychiatric: His speech is normal and behavior is normal. His mood appears anxious. Cognition and memory are normal. He expresses impulsivity. He exhibits a depressed mood. He expresses suicidal (Passive) ideation. He expresses no suicidal plans.    Review of Systems  Neurological: Positive for seizures.  Psychiatric/Behavioral: Positive for depression, substance abuse and suicidal ideas. The patient is nervous/anxious.   All other systems reviewed and are negative.   Blood pressure (!) 155/90, pulse 87, resp. rate 18, SpO2 98 %.There is no height or weight on file to calculate BMI.  General Appearance: Casual  Eye Contact:  Good  Speech:  Clear and Coherent and Normal Rate  Volume:  Normal  Mood:  Anxious and Depressed  Affect:  Depressed  Thought Process:  Goal Directed  Orientation:  Full (Time, Place, and Person)  Thought Content:  Denies hallucinations, delusions, and paranoia  Suicidal Thoughts:  Yes.  without intent/plan  Homicidal Thoughts:  No  Memory:  Immediate;   Fair Recent;   Fair Remote;   Fair  Judgement:  Fair  Insight:  Fair  Psychomotor Activity:  Normal  Concentration: Concentration: Good and Attention Span: Good  Recall:  [place] of Knowledge:Good  Language: Good   Akathisia:  No  Handed:  Right  AIMS (if indicated):     Assets:  Communication Skills Desire for Improvement Housing Social Support  Sleep:       Musculoskeletal: Strength & Muscle Tone: within normal limits Gait & Station: normal Patient leans: N/A  Blood pressure (!) 155/90, pulse 87, resp. rate 18, SpO2 98 %.  Recommendations:  Inpatient psychiatric treatment.  Accepted [facility]  Based on my evaluation the patient does not appear to have an emergency medical condition.  Shaniqwa Horsman, NP 08/19/2017, 5:05 PM

## 2017-08-19 NOTE — BH Assessment (Addendum)
Assessment Note  Isaac [name] is a 56 y.o. male who presents voluntarily to [facility] as a walk in due to depression with associated SI. Pt reports relapsing on alcohol and crack cocaine @ 3 months ago and, since then, he's lost his job and his housing. Pt has been staying with family but doesn't believe he can go back there. Pt reports worsening depression w/ passive SI. Pt is unable to contract for safety.     Diagnosis: MDD, single episode, severe; Alcohol use d/o, Cocaine use d/o  Past Medical History:  Past Medical History:  Diagnosis Date  . Alcoholism ([place])   . AVM (arteriovenous malformation) brain   . Hepatitis C   . Hepatitis C infection   . HIV (human immunodeficiency virus infection) ([place])   . HIV (human immunodeficiency virus infection) ([place])    Follows with Dr. Johnnye Sima   . Hypertension   . Immune deficiency disorder ([place])   . Seizure disorder ([place])   . Seizures ([place])     No past surgical history on file.  Family History: No family history on file.  Social History:  reports that he has been smoking cigarettes.  He started smoking about 5 years ago. he has never used smokeless tobacco. He reports that he drinks about 2.4 oz of alcohol per week. He reports that he uses drugs. Drug: Cocaine.  Additional Social History:  Alcohol / Drug Use Pain Medications: see PTA meds Prescriptions: see PTA meds Over the Counter: see PTA meds History of alcohol / drug use?: Yes Longest period of sobriety (when/how long): 5 years, has hx of seizure disorder but reports not related to etoh use or withdrawal  Negative Consequences of Use: Financial, Legal, Personal relationships Withdrawal Symptoms: Agitation, Cramps, Nausea / Vomiting, Irritability, Tremors Substance #1 Name of Substance 1: alcohol 1 - Amount (size/oz): 1/5 of liquor + 3-4 40 ounces 1 - Frequency: daily 1 - Duration: ongoing 1 - Last Use / Amount: 4am this morning Substance #2 Name of Substance 2: crack cocaine 2 -  Frequency: daily 2 - Duration: ongoing 2 - Last Use / Amount: 4am this morning  CIWA: CIWA-Ar BP: (!) 155/90 Pulse Rate: 87 COWS:    Allergies: No Known Allergies  Home Medications:  Medications Prior to Admission  Medication Sig Dispense Refill  . benazepril-hydrochlorthiazide (LOTENSIN HCT) 10-12.5 MG tablet Take 1 tablet by mouth daily. 30 tablet 11  . fluconazole (DIFLUCAN) 100 MG tablet Take 1 tablet (100 mg total) by mouth daily. 10 tablet 0  . GENVOYA 150-150-200-10 MG TABS tablet TAKE 1 TABLET BY MOUTH DAILY WITH BREAKFAST 90 tablet 2  . levETIRAcetam (KEPPRA) 750 MG tablet TAKE 1 TABLET BY MOUTH TWICE DAILY 60 tablet 5  . mirtazapine (REMERON) 7.5 MG tablet Take 1 tablet (7.5 mg total) by mouth at bedtime. (Patient not taking: Reported on 01/26/2017) 30 tablet 0  . Multiple Vitamin (MULTIVITAMIN WITH MINERALS) TABS tablet Take 1 tablet by mouth daily. 30 tablet 0  . [DISCONTINUED] HYDROcodone-acetaminophen (NORCO/VICODIN) 5-325 MG tablet Take 1 tablet by mouth every 6 (six) hours as needed. (Patient not taking: Reported on 01/26/2017) 15 tablet 0    OB/GYN Status:  No LMP for male patient.  General Assessment Data Location of Assessment: [facility] Assessment Services TTS Assessment: In system Is this a Tele or Face-to-Face Assessment?: Face-to-Face Is this an Initial Assessment or a Re-assessment for this encounter?: Initial Assessment Marital status: Single Living Arrangements: Other (Comment)(was living with family but can't go back) Can  pt return to current living arrangement?: No Admission Status: Voluntary Is patient capable of signing voluntary admission?: Yes Referral Source: Self/Family/Friend Insurance type: none  Medical Screening Exam ([place]) Medical Exam completed: Yes  Crisis Care Plan Living Arrangements: Other (Comment)(was living with family but can't go back) Name of Psychiatrist: none Name of Therapist: none  Education Status Is patient  currently in school?: No  Risk to self with the past 6 months Suicidal Ideation: Yes-Currently Present Has patient been a risk to self within the past 6 months prior to admission? : No Suicidal Intent: No Has patient had any suicidal intent within the past 6 months prior to admission? : No Is patient at risk for suicide?: Yes Suicidal Plan?: No Has patient had any suicidal plan within the past 6 months prior to admission? : No Access to Means: No Previous Attempts/Gestures: No Intentional Self Injurious Behavior: None Family Suicide History: Unknown Recent stressful life event(s): Job Loss, Financial Problems, Other (Comment) Persecutory voices/beliefs?: No Depression: Yes Depression Symptoms: Tearfulness, Guilt, Feeling worthless/self pity Substance abuse history and/or treatment for substance abuse?: Yes Suicide prevention information given to non-admitted patients: Not applicable  Risk to Others within the past 6 months Homicidal Ideation: No Does patient have any lifetime risk of violence toward others beyond the six months prior to admission? : No Thoughts of Harm to Others: No Current Homicidal Intent: No Current Homicidal Plan: No Access to Homicidal Means: No History of harm to others?: No Assessment of Violence: None Noted Does patient have access to weapons?: No Criminal Charges Pending?: No Does patient have a court date: No Is patient on probation?: No  Psychosis Hallucinations: None noted Delusions: None noted  Mental Status Report Appearance/Hygiene: Unremarkable Eye Contact: Fair Motor Activity: Unremarkable Speech: Logical/coherent Level of Consciousness: Alert Mood: Depressed Affect: Appropriate to circumstance Anxiety Level: Minimal Thought Processes: Coherent, Relevant Judgement: Partial Orientation: Person, Place, Time, Situation Obsessive Compulsive Thoughts/Behaviors: None  Cognitive Functioning Concentration: Normal Memory: Recent Intact,  Remote Intact IQ: Average Insight: Fair Impulse Control: Fair Appetite: Good Sleep: No Change Vegetative Symptoms: None  ADLScreening [facility] Assessment Services) Patient's cognitive ability adequate to safely complete daily activities?: Yes Patient able to express need for assistance with ADLs?: Yes Independently performs ADLs?: Yes (appropriate for developmental age)  Prior Inpatient Therapy Prior Inpatient Therapy: Yes Prior Therapy Dates: 12/2016; 12/2014 Prior Therapy Facilty/Provider(s): [facility] Reason for Treatment: alcohol abuse; si  Prior Outpatient Therapy Prior Outpatient Therapy: No Does patient have an ACCT team?: No Does patient have Intensive In-House Services?  : No Does patient have [facility] services? : No Does patient have P4CC services?: No  ADL Screening (condition at time of admission) Patient's cognitive ability adequate to safely complete daily activities?: Yes Is the patient deaf or have difficulty hearing?: No Does the patient have difficulty seeing, even when wearing glasses/contacts?: No Does the patient have difficulty concentrating, remembering, or making decisions?: No Patient able to express need for assistance with ADLs?: Yes Does the patient have difficulty dressing or bathing?: No Independently performs ADLs?: Yes (appropriate for developmental age) Does the patient have difficulty walking or climbing stairs?: No Weakness of Legs: None Weakness of Arms/Hands: None  Home Assistive Devices/Equipment Home Assistive Devices/Equipment: None  Therapy Consults (therapy consults require a physician order) PT Evaluation Needed: No OT Evalulation Needed: No SLP Evaluation Needed: No Abuse/Neglect Assessment (Assessment to be complete while patient is alone) Abuse/Neglect Assessment Can Be Completed: Yes Physical Abuse: Denies Verbal Abuse: Denies Sexual Abuse:  Denies Exploitation of patient/patient's resources: Denies Self-Neglect: Denies Values  / Beliefs Cultural Requests During Hospitalization: None Spiritual Requests During Hospitalization: None Consults Spiritual Care Consult Needed: No Social Work Consult Needed: No Regulatory affairs officer (For Healthcare) Does Patient Have a Medical Advance Directive?: No Would patient like information on creating a medical advance directive?: No - Patient declined Nutrition Screen- [place] Adult/WL/AP Patient's home diet: Regular Has the patient recently lost weight without trying?: No Has the patient been eating poorly because of a decreased appetite?: No Malnutrition Screening Tool Score: 0  Additional Information 1:1 In Past 12 Months?: No CIRT Risk: No Elopement Risk: No Does patient have medical clearance?: Yes     Disposition:  Disposition Initial Assessment Completed for this Encounter: Yes(consulted with Shuvon Rankin, NP) Disposition of Patient: Inpatient treatment program Type of inpatient treatment program: Adult(pt accepted to [facility])  On Site Evaluation by:   Reviewed with Physician:    Rexene Edison 08/19/2017 5:45 PM

## 2017-08-19 NOTE — Progress Notes (Signed)
D: Patient observed resting in bed after dinner. Patient states he is feeling some withdrawal. Anxious, feels agitated. Patient cautious in interaction. Pleasant and cooperative. Patient's affect anxious with congruent mood.   A: Medicated per ativan protocol, no prns requested or required. EKG completed and filed in paper chart. Indicates NSR. Urine cup given with instructions. Level III obs in place for safety. Emotional support offered. Fall prevention plan in place and reviewed with patient as pt is a high fall risk due to seizure disorder.   R: Patient verbalizes understanding of POC, falls prevention education. Patient remains safe on level III obs.

## 2017-08-19 NOTE — Progress Notes (Signed)
Isaac [name] is a 56 year old male pt admitted on voluntary basis. On admission, he does endorse passive SI but able to contract for safety while in the hospital. He spoke about doing ok until October of this year and spoke about how he had a personal issue come up and he started to use again and spoke about how he has been using daily since(cocaine and alcohol). He reports that he was taking his medications up until about a month ago and reports that he would like to get back on his medications while he is here. He reports that he had a seizure about 2 weeks ago and feels that it is because he has been off his medications. Christyan reports that he is unsure of where he will go once he is discharged but spoke about a halfway house or a long term facility. Eliezer was oriented to the unit and safety maintained.

## 2017-08-20 DIAGNOSIS — B20 Human immunodeficiency virus [HIV] disease: Secondary | ICD-10-CM

## 2017-08-20 DIAGNOSIS — F332 Major depressive disorder, recurrent severe without psychotic features: Principal | ICD-10-CM

## 2017-08-20 DIAGNOSIS — I10 Essential (primary) hypertension: Secondary | ICD-10-CM

## 2017-08-20 DIAGNOSIS — F410 Panic disorder [episodic paroxysmal anxiety] without agoraphobia: Secondary | ICD-10-CM

## 2017-08-20 DIAGNOSIS — Z818 Family history of other mental and behavioral disorders: Secondary | ICD-10-CM

## 2017-08-20 DIAGNOSIS — F17210 Nicotine dependence, cigarettes, uncomplicated: Secondary | ICD-10-CM

## 2017-08-20 DIAGNOSIS — F419 Anxiety disorder, unspecified: Secondary | ICD-10-CM

## 2017-08-20 DIAGNOSIS — Z813 Family history of other psychoactive substance abuse and dependence: Secondary | ICD-10-CM

## 2017-08-20 DIAGNOSIS — Z811 Family history of alcohol abuse and dependence: Secondary | ICD-10-CM

## 2017-08-20 DIAGNOSIS — F4010 Social phobia, unspecified: Secondary | ICD-10-CM

## 2017-08-20 LAB — LIPID PANEL
CHOL/HDL RATIO: 3.6 ratio
Cholesterol: 166 mg/dL (ref 0–200)
HDL: 46 mg/dL (ref >40)
LDL Cholesterol: 90 mg/dL (ref 0–99)
Triglycerides: 149 mg/dL (ref <150)
VLDL: 30 mg/dL (ref 0–40)

## 2017-08-20 LAB — MAGNESIUM: Magnesium: 2 mg/dL (ref 1.7–2.4)

## 2017-08-20 LAB — CBC
HCT: 40.5 % (ref 39.0–52.0)
HEMOGLOBIN: 13 g/dL (ref 13.0–17.0)
MCH: 28.3 pg (ref 26.0–34.0)
MCHC: 32.1 g/dL (ref 30.0–36.0)
MCV: 88 fL (ref 78.0–100.0)
PLATELETS: 342 10*3/uL (ref 150–400)
RBC: 4.6 MIL/uL (ref 4.22–5.81)
RDW: 12.2 % (ref 11.5–15.5)
WBC: 5.2 10*3/uL (ref 4.0–10.5)

## 2017-08-20 LAB — COMPREHENSIVE METABOLIC PANEL
ALBUMIN: 3.7 g/dL (ref 3.5–5.0)
ALK PHOS: 63 U/L (ref 38–126)
ALT: 24 U/L (ref 17–63)
AST: 32 U/L (ref 15–41)
Anion gap: 9 (ref 5–15)
BUN: 21 mg/dL — AB (ref 6–20)
CHLORIDE: 104 mmol/L (ref 101–111)
CO2: 26 mmol/L (ref 22–32)
CREATININE: 1.25 mg/dL — AB (ref 0.61–1.24)
Calcium: 8.9 mg/dL (ref 8.9–10.3)
GFR calc non Af Amer: >60 mL/min (ref >60)
GLUCOSE: 92 mg/dL (ref 65–99)
Potassium: 3.5 mmol/L (ref 3.5–5.1)
SODIUM: 139 mmol/L (ref 135–145)
Total Bilirubin: 0.8 mg/dL (ref 0.3–1.2)
Total Protein: 7.2 g/dL (ref 6.5–8.1)

## 2017-08-20 LAB — TSH: TSH: 1.074 u[IU]/mL (ref 0.350–4.500)

## 2017-08-20 LAB — ETHANOL
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
## 2017-08-20 NOTE — B[facility] Group Notes (Signed)
Pt did not attend AA group. Pt stayed in bed.

## 2017-08-20 NOTE — Progress Notes (Signed)
D.  Pt in bed on approach, denies complaints at this time.  Pt did get up after medication administered to get some snacks.  Pt denies SI/HI/AVH at this time, but did report passive SI on day shift.  Pt returned to bed after this.  A.  Support and encouragement offered, medications given as ordered  R.  Pt remains safe on unit, will continue to monitor.

## 2017-08-20 NOTE — Progress Notes (Signed)
D. Pt presents with a sad affect with congruent behavior. Pt somewhat isolative, spending much of the morning in bed, but went to lunch and attended group led by Education officer, museum. Pt is calm and cooperative but forwards little in interactions.Pt endorses passive SI- with no plan- verbally contracts for safety.  A. Labs and vitals monitored. Pt compliant with medications. Pt supported emotionally and encouraged to express concerns and ask questions.   R. Pt remains safe with 15 minute checks. Will continue POC.

## 2017-08-20 NOTE — B[facility] Counselor (Signed)
Clinical Social Work Note  Psychosocial Assessment was attempted, and patient refused, stating he would do it later.  When told it would be tomorrow, he wanted it to be done later today but told this was not possible.  Selmer Dominion, LCSW 08/20/2017, 3:56 PM
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
## 2017-08-20 NOTE — BHH Group Notes (Signed)
[facility] Group Notes: (Clinical Social Work)   08/20/2017      Type of Therapy:  Group Therapy   Participation Level:  Did Not Attend despite MHT prompting   Selmer Dominion, LCSW 08/20/2017, 3:56 PM

## 2017-08-21 DIAGNOSIS — F1410 Cocaine abuse, uncomplicated: Secondary | ICD-10-CM

## 2017-08-21 DIAGNOSIS — F39 Unspecified mood [affective] disorder: Secondary | ICD-10-CM

## 2017-08-21 LAB — URINALYSIS, COMPLETE (UACMP) WITH MICROSCOPIC
BILIRUBIN URINE: NEGATIVE
Bacteria, UA: NONE SEEN
GLUCOSE, UA: NEGATIVE mg/dL
HGB URINE DIPSTICK: NEGATIVE
KETONES UR: NEGATIVE mg/dL
LEUKOCYTES UA: NEGATIVE
NITRITE: NEGATIVE
PH: 7 (ref 5.0–8.0)
Protein, ur: NEGATIVE mg/dL
Specific Gravity, Urine: 1.019 (ref 1.005–1.030)

## 2017-08-21 LAB — RAPID URINE DRUG SCREEN, HOSP PERFORMED
Amphetamines: NOT DETECTED
BARBITURATES: NOT DETECTED
BENZODIAZEPINES: POSITIVE — AB
Cocaine: POSITIVE — AB
Opiates: NOT DETECTED
Tetrahydrocannabinol: NOT DETECTED

## 2017-08-21 LAB — HEMOGLOBIN A1C
HEMOGLOBIN A1C: 4.6 % — AB (ref 4.8–5.6)
MEAN PLASMA GLUCOSE: 85 mg/dL

## 2017-08-21 NOTE — BHH Group Notes (Signed)
[facility] Group Notes:  (Nursing/MHT/Case Management/Adjunct)  Date:  08/21/2017  Time:  1030  Type of Therapy:  Nurse Education - Suicide Safety Plan  Participation Level:  Did Not Attend  Participation Quality:    Affect:    Cognitive:    Insight:    Engagement in Group:    Modes of Intervention:    Summary of Progress/Problems: Patient was invited to attend however elected to remain in bed.   Loletta Specter [facility] 08/21/2017, 11:25 AM

## 2017-08-21 NOTE — Progress Notes (Signed)
Nursing Progress Note [id number]  D) Patient presents with flat affect and depressed mood. Patient did not attend group. Patient denies SI/HI/AVH, withdrawal symptoms or pain. Patient contracts for safety on the unit. Patient is isolative to his bed and denied need for scheduled Remeron this evening.  A) Emotional support given. 1:1 interaction and active listening provided. Snacks and fluids provided. Opportunities for questions or concerns presented to patient. Patient encouraged to continue to work on treatment goals. Labs, vital signs and patient behavior monitored throughout shift. Patient safety maintained with q15 min safety checks. Low fall risk precautions in place and reviewed with patient; patient verbalized understanding.  R) Patient remains safe on the unit at this time. Patient is resting in bed without complaints. Will continue to monitor.

## 2017-08-21 NOTE — Plan of Care (Signed)
Patient remains isolative to bed, room. Up for meals only. Refusing to participate in programming.  Patient verbalizes understanding of information, education provided.
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
## 2017-08-21 NOTE — Progress Notes (Signed)
D: Patient observed resting in bed where he has remained much of the day. Refuses to get up for groups, complete self inventory. Does go to meals. Patient provides minimal information, very soft spoken. Patient's affect flat, mood depressed with slight irritability noted. Cannot identify a goal for today. Denies pain, physical complaints.   A: Medicated per orders, no prns requested or required. Level III obs in place for safety. Emotional support offered. Encouraged completion of self inventory, Suicide Safety Plan and programming participation. Discussed POC with MD, SW.  Fall prevention plan in place and reviewed with patient as pt is a high fall risk due to seizures.   R: Patient verbalizes understanding of POC, falls prevention education. Patient denies SI/HI/AVH and remains safe on level III obs. Will continue to monitor closely and make verbal contact frequently.

## 2017-08-21 NOTE — BHH Group Notes (Signed)
[facility] Group Notes: (Clinical Social Work)   08/21/2017      Type of Therapy:  Group Therapy   Participation Level:  Did Not Attend despite MHT prompting   Selmer Dominion, LCSW 08/21/2017, 3:52 PM

## 2017-08-22 DIAGNOSIS — G4700 Insomnia, unspecified: Secondary | ICD-10-CM

## 2017-08-22 DIAGNOSIS — F1010 Alcohol abuse, uncomplicated: Secondary | ICD-10-CM

## 2017-08-22 NOTE — Progress Notes (Signed)
Patient ID: Isaac [name], male   DOB: 03-25-1961, 56 y.o.   MRN: [id number]  Pt currently presents with a flat affect and agitated behavior. Pt requests medications early, snacks early, to be awoken for snacks and medications if asleep. Pt responds with curt answers to writers questions. Chooses not to attend group. Pt reports good sleep with current medication regimen.  Pt provided with medications per providers orders. Pt's labs and vitals were monitored throughout the night. Pt given a 1:1 about emotional and mental status. Pt supported and encouraged to express concerns and questions. Pt educated on medications. Encouraged to attend group, refused.   Pt's safety ensured with 15 minute and environmental checks. Pt currently denies SI/HI and A/V hallucinations. Pt verbally agrees to seek staff if SI/HI or A/VH occurs and to consult with staff before acting on any harmful thoughts. Will continue POC.

## 2017-08-22 NOTE — BHH Group Notes (Signed)
[facility] Mental Health Association Group Therapy 08/22/2017 1:15pm  Type of Therapy: Mental Health Association Presentation  Participation Level: DID NOT ATTEND. Invited. Chose to remain in bed.   Summary of Progress/Problems: [facility]) Speaker came to talk about his personal journey with mental health. The pt processed ways by which to relate to the speaker. [place] speaker provided handouts and educational information pertaining to groups and services offered by the [facility]. Pt was engaged in speaker's presentation and was receptive to resources provided.    [employer], LCSW 08/22/2017 2:50 PM
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
## 2017-08-22 NOTE — Progress Notes (Signed)
[place] Note:  Patient presents with flat, irritable affect and mood.  Denies suicidal thoughts, auditory and visual hallucinations.  Reports having a poor night sleep due to having vivid dreams and nightmares.  Medications given as prescribed.  Support and encouragement offered as needed.  Described energy level as low and concentration as poor.  Patient is withdrawn and isolative to his room most of this shift.  Routine safety checks maintained.  Patient is safe on the unit.

## 2017-08-22 NOTE — Progress Notes (Signed)
Recreation Therapy Notes  Date: 08/22/17 Time: 0930 Location: [address] Dayroom  Group Topic: Stress Management  Goal Area(s) Addresses:  Patient will verbalize importance of using healthy stress management.  Patient will identify positive emotions associated with healthy stress management.   Intervention: Stress Management  Activity :  Meditation.  LRT introduced the stress management technique of meditation.  LRT played a meditation that allowed patients to take inventory of the sensations they may be feeling throughout their bodies.  Education:  Stress Management, Discharge Planning.   Education Outcome: Acknowledges edcuation/In group clarification offered/Needs additional education  Clinical Observations/Feedback: Pt did not attend group.    Victorino Sparrow, LRT/CTRS         Victorino Sparrow A 08/22/2017 11:31 AM

## 2017-08-22 NOTE — Tx Team (Signed)
Interdisciplinary Treatment and Diagnostic Plan Update  08/22/2017 Time of Session: [place] MRN: [id number]  Principal Diagnosis: MDD (major depressive disorder), recurrent severe, without psychosis ([place])  Secondary Diagnoses: Principal Problem:   MDD (major depressive disorder), recurrent severe, without psychosis ([place]) Active Problems:   HIV disease ([place])   Essential hypertension   Seizure ([place])   Alcohol abuse   Cocaine abuse ([place])   Current Medications:  Current Facility-Administered Medications  Medication Dose Route Frequency Provider Last Rate Last Dose  . acetaminophen (TYLENOL) tablet 650 mg  650 mg Oral Q6H PRN Rankin, Shuvon B, NP      . alum & mag hydroxide-simeth (MAALOX/MYLANTA) 200-200-20 MG/5ML suspension 30 mL  30 mL Oral Q4H PRN Rankin, Shuvon B, NP      . benazepril (LOTENSIN) tablet 10 mg  10 mg Oral Daily Cobos, Myer Peer, MD   10 mg at 08/22/17 4709   And  . hydrochlorothiazide (MICROZIDE) capsule 12.5 mg  12.5 mg Oral Daily Cobos, Myer Peer, MD   12.5 mg at 08/22/17 0806  . elvitegravir-cobicistat-emtricitabine-tenofovir (GENVOYA) 150-150-200-10 MG tablet 1 tablet  1 tablet Oral Q breakfast Rankin, Shuvon B, NP   1 tablet at 08/22/17 0806  . feeding supplement (ENSURE ENLIVE) (ENSURE ENLIVE) liquid 237 mL  237 mL Oral BID BM Cobos, Fernando A, MD   237 mL at 08/21/17 1450  . hydrOXYzine (ATARAX/VISTARIL) tablet 25 mg  25 mg Oral Q6H PRN Rankin, Shuvon B, NP   25 mg at 08/20/17 2207  . Influenza vac split quadrivalent PF (FLUARIX) injection 0.5 mL  0.5 mL Intramuscular Tomorrow-1000 Cobos, Fernando A, MD      . levETIRAcetam (KEPPRA) tablet 750 mg  750 mg Oral BID Rankin, Shuvon B, NP   750 mg at 08/22/17 0806  . loperamide (IMODIUM) capsule 2-4 mg  2-4 mg Oral PRN Rankin, Shuvon B, NP      . LORazepam (ATIVAN) tablet 1 mg  1 mg Oral Q6H PRN Rankin, Shuvon B, NP      . LORazepam (ATIVAN) tablet 1 mg  1 mg Oral BID Rankin, Shuvon B, NP   1 mg at  08/22/17 0807   Followed by  . [START ON 08/23/2017] LORazepam (ATIVAN) tablet 1 mg  1 mg Oral Daily Rankin, Shuvon B, NP      . magnesium hydroxide (MILK OF MAGNESIA) suspension 30 mL  30 mL Oral Daily PRN Rankin, Shuvon B, NP      . mirtazapine (REMERON) tablet 7.5 mg  7.5 mg Oral QHS Rankin, Shuvon B, NP   7.5 mg at 08/20/17 2207  . multivitamin with minerals tablet 1 tablet  1 tablet Oral Daily Rankin, Shuvon B, NP   1 tablet at 08/22/17 0806  . ondansetron (ZOFRAN-ODT) disintegrating tablet 4 mg  4 mg Oral Q6H PRN Rankin, Shuvon B, NP      . thiamine (B-1) injection 100 mg  100 mg Intramuscular Once Rankin, Shuvon B, NP      . thiamine (VITAMIN B-1) tablet 100 mg  100 mg Oral Daily Rankin, Shuvon B, NP   100 mg at 08/22/17 0806   PTA Medications: Medications Prior to Admission  Medication Sig Dispense Refill Last Dose  . Emollient (AVEENO ACTIVE NAT SKIN RELIEF EX) Apply topically as needed.     Marland Kitchen ibuprofen (ADVIL,MOTRIN) 400 MG tablet Take 400 mg by mouth every 6 (six) hours as needed for headache or mild pain.     . benazepril-hydrochlorthiazide (LOTENSIN HCT) 10-12.5  MG tablet Take 1 tablet by mouth daily. 30 tablet 11   . fluconazole (DIFLUCAN) 100 MG tablet Take 1 tablet (100 mg total) by mouth daily. 10 tablet 0   . GENVOYA 150-150-200-10 MG TABS tablet TAKE 1 TABLET BY MOUTH DAILY WITH BREAKFAST 90 tablet 2   . levETIRAcetam (KEPPRA) 750 MG tablet TAKE 1 TABLET BY MOUTH TWICE DAILY 60 tablet 5   . mirtazapine (REMERON) 7.5 MG tablet Take 1 tablet (7.5 mg total) by mouth at bedtime. (Patient not taking: Reported on 01/26/2017) 30 tablet 0 Not Taking  . Multiple Vitamin (MULTIVITAMIN WITH MINERALS) TABS tablet Take 1 tablet by mouth daily. 30 tablet 0 Taking  . [DISCONTINUED] HYDROcodone-acetaminophen (NORCO/VICODIN) 5-325 MG tablet Take 1 tablet by mouth every 6 (six) hours as needed. (Patient not taking: Reported on 01/26/2017) 15 tablet 0 Not Taking    Patient Stressors: Health  problems Marital or family conflict Medication change or noncompliance Substance abuse  Patient Strengths: Ability for insight Average or above average intelligence Capable of independent living General fund of knowledge Motivation for treatment/growth  Treatment Modalities: Medication Management, Group therapy, Case management,  1 to 1 session with clinician, Psychoeducation, Recreational therapy.   Physician Treatment Plan for Primary Diagnosis: MDD (major depressive disorder), recurrent severe, without psychosis ([place]) Long Term Goal(s): Improvement in symptoms so as ready for discharge Improvement in symptoms so as ready for discharge   Short Term Goals: Ability to demonstrate self-control will improve Ability to identify triggers associated with substance abuse/mental health issues will improve Ability to verbalize feelings will improve Ability to disclose and discuss suicidal ideas Compliance with prescribed medications will improve  Medication Management: Evaluate patient's response, side effects, and tolerance of medication regimen.  Therapeutic Interventions: 1 to 1 sessions, Unit Group sessions and Medication administration.  Evaluation of Outcomes: Progressing  Physician Treatment Plan for Secondary Diagnosis: Principal Problem:   MDD (major depressive disorder), recurrent severe, without psychosis ([place]) Active Problems:   HIV disease ([place])   Essential hypertension   Seizure ([place])   Alcohol abuse   Cocaine abuse ([place])  Long Term Goal(s): Improvement in symptoms so as ready for discharge Improvement in symptoms so as ready for discharge   Short Term Goals: Ability to demonstrate self-control will improve Ability to identify triggers associated with substance abuse/mental health issues will improve Ability to verbalize feelings will improve Ability to disclose and discuss suicidal ideas Compliance with prescribed medications will improve     Medication  Management: Evaluate patient's response, side effects, and tolerance of medication regimen.  Therapeutic Interventions: 1 to 1 sessions, Unit Group sessions and Medication administration.  Evaluation of Outcomes: Progressing   RN Treatment Plan for Primary Diagnosis: MDD (major depressive disorder), recurrent severe, without psychosis ([place]) Long Term Goal(s): Knowledge of disease and therapeutic regimen to maintain health will improve  Short Term Goals: Ability to remain free from injury will improve, Ability to disclose and discuss suicidal ideas and Ability to identify and develop effective coping behaviors will improve  Medication Management: RN will administer medications as ordered by provider, will assess and evaluate patient's response and provide education to patient for prescribed medication. RN will report any adverse and/or side effects to prescribing provider.  Therapeutic Interventions: 1 on 1 counseling sessions, Psychoeducation, Medication administration, Evaluate responses to treatment, Monitor vital signs and CBGs as ordered, Perform/monitor CIWA, COWS, AIMS and Fall Risk screenings as ordered, Perform wound care treatments as ordered.  Evaluation of Outcomes: Progressing   LCSW Treatment Plan  for Primary Diagnosis: MDD (major depressive disorder), recurrent severe, without psychosis ([place]) Long Term Goal(s): Safe transition to appropriate next level of care at discharge, Engage patient in therapeutic group addressing interpersonal concerns.  Short Term Goals: Engage patient in aftercare planning with referrals and resources, Facilitate patient progression through stages of change regarding substance use diagnoses and concerns and Identify triggers associated with mental health/substance abuse issues  Therapeutic Interventions: Assess for all discharge needs, 1 to 1 time with Social worker, Explore available resources and support systems, Assess for adequacy in community  support network, Educate family and significant other(s) on suicide prevention, Complete Psychosocial Assessment, Interpersonal group therapy.  Evaluation of Outcomes: Progressing   Progress in Treatment: Attending groups: Yes. Participating in groups: Yes. Taking medication as prescribed: Yes. Toleration medication: Yes. Family/Significant other contact made: No, will contact:  pt's niece Patient understands diagnosis: Yes. Discussing patient identified problems/goals with staff: Yes. Medical problems stabilized or resolved: Yes. Denies suicidal/homicidal ideation: Yes. Issues/concerns per patient self-inventory: No. Other: n/a   New problem(s) identified: No, Describe:  n/a  New Short Term/Long Term Goal(s): detox, medication management for mood stabilization; elimination of SI thoughts, development of comprehensive mental wellness/sobriety plan.   Discharge Plan or Barriers: CSW assessing for appropriate referrals. Pt interested in [facility], [facility] and [facility] referral.   Reason for Continuation of Hospitalization: Anxiety Depression Medication stabilization Suicidal ideation Withdrawal symptoms  Estimated Length of Stay: Wed, 08/23/17  Attendees: Patient: 08/22/2017 8:58 AM  Physician: Dr. Nancy Fetter MD; Dr. Parke Poisson MD 08/22/2017 8:58 AM  Nursing: Jasmine Awe RN 08/22/2017 8:58 AM  RN Care Manager: Lars Pinks CM 08/22/2017 8:58 AM  Social Worker: Maxie Better, LCSW 08/22/2017 8:58 AM  Recreational Therapist: x 08/22/2017 8:58 AM  Other: Marvia Pickles NP; Lindell Spar NP 08/22/2017 8:58 AM  Other:  08/22/2017 8:58 AM  Other: 08/22/2017 8:58 AM    Scribe for Treatment Team: [place], LCSW 08/22/2017 8:58 AM

## 2017-08-22 NOTE — Progress Notes (Signed)
[facility] referral faxed per patient request.  Maxie Better, MSW, LCSW Clinical Social Worker 08/22/2017 10:28 AM

## 2017-08-23 DIAGNOSIS — F1910 Other psychoactive substance abuse, uncomplicated: Secondary | ICD-10-CM

## 2017-08-23 MED ORDER — MIRTAZAPINE 7.5 MG PO TABS: 7.5000 mg | ORAL_TABLET | Freq: Every day | ORAL | 0 refills | Status: DC

## 2017-08-23 NOTE — Progress Notes (Signed)
Recreation Therapy Notes  Animal-Assisted Activity (AAA) Program Checklist/Progress Notes Patient Eligibility Criteria Checklist & Daily Group note for Rec TxIntervention  Date: 12.18.2018 Time: 2:45pm Location: [address]   AAA/T Program Assumption of Risk Form signed by Patient/ or Parent Legal Guardian Yes  Patient is free of allergies or sever asthma Yes  Patient reports no fear of animals Yes  Patient reports no history of cruelty to animals Yes  Patient understands his/her participation is voluntary Yes  Behavioral Response: Did not attend.   Laureen [place] Damiani, LRT/CTRS        Serafina Topham L 08/23/2017 3:07 PM

## 2017-08-23 NOTE — Progress Notes (Signed)
D:  Patient's self inventory sheet, patient has fair sleep, no sleep medication given.  Good appetite, low energy level, poor concentration.  Rated depression 4, hopeless and anxiety #5.  Withdrawals, cravings, runny nose, irritability.  Denied SI.  Physical problems, muscle pain, lightheaded, headaches.  Worst pain in past 24 hours is #7, shoulder, leg.  Pain medication not helpful.  Wants to keep on living.  Plans to pray.  Feels hopeless.  No discharge plans.  Homeless and helpless.   A:  Medications administered per MD orders.  Emotional support and encouragement given patient. R:  Denied SI and HI, contracts for safety.  Denied A/V hallucinations.  Safety maintained with 15 minute checks.

## 2017-08-23 NOTE — BHH Group Notes (Signed)
Pt did not attend wrap up group. Pt laid in bed instead.

## 2017-08-23 NOTE — Plan of Care (Signed)
Nurse discussed depression, anxiety, coping skills with patient.  

## 2017-08-23 NOTE — Progress Notes (Signed)
Patient ID: Isaac [name], male   DOB: 1961/08/14, 56 y.o.   MRN: [id number]   D: Patient as a flat affect on approach. Attended the meeting and came for medications. Reports no improvement with his mood much since being here. Continues to have some passive SI on and off. Contracts on unit.  A: Staff will continue to monitor on q 15 minute checks, follow treatment plan, and give meds as ordered. R: Cooperative on the unit.
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
## 2017-08-24 DIAGNOSIS — R450 Nervousness: Secondary | ICD-10-CM

## 2017-08-24 MED ORDER — MIRTAZAPINE 15 MG PO TABS
15.0000 mg | ORAL_TABLET | Freq: Every day | ORAL | Status: DC
  Administered 2017-08-24 – 2017-08-25 (×2): 15 mg via ORAL
  Filled 2017-08-24 (×2): qty 1
  Filled 2017-08-24: qty 7
  Filled 2017-08-24: qty 1

## 2017-08-24 MED ORDER — HYDROXYZINE HCL 50 MG PO TABS
50.0000 mg | ORAL_TABLET | Freq: Four times a day (QID) | ORAL | Status: DC | PRN
  Administered 2017-08-24: 50 mg via ORAL
  Filled 2017-08-24: qty 1
  Filled 2017-08-24: qty 10

## 2017-08-24 NOTE — BHH Group Notes (Signed)
Pt attended and participated in wrap up group and rated their day a 7/10. Pt goal was to feel better. When asked if they had a goal more specific to treatment, they replied "no". Pt was able to rest and that was a positive for the pt, and they are "hoping for a better tomorrow".

## 2017-08-24 NOTE — Progress Notes (Signed)
Recreation Therapy Notes  Date: 08/24/17 Time: 0930 Location: [address] Dayroom  Group Topic: Stress Management  Goal Area(s) Addresses:  Patient will verbalize importance of using healthy stress management.  Patient will identify positive emotions associated with healthy stress management.   Intervention: Stress Management  Activity : Guided Imagery.  LRT introduced the stress management technique of guided imagery.  Patients were to listen and follow along as LRT read script to fully engage in the technique.  Education:  Stress Management, Discharge Planning.   Education Outcome: Acknowledges edcuation/In group clarification offered/Needs additional education  Clinical Observations/Feedback: Pt did not attend group.    Victorino Sparrow, LRT/CTRS         Victorino Sparrow A 08/24/2017 12:33 PM
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
## 2017-08-24 NOTE — Progress Notes (Signed)
  DATA ACTION RESPONSE  Objective- Pt. is visible in the dayroom, seen eating a snack. Presents with a depressed/anxious affect and mood. Pt brightens mildly with conversation. No further c/o. No abnormal s/s.  Subjective- Denies having any HI/AVH at this time. Endorses passive SI but verbal contracts for safety. Rates pain 8/10; Headache.  Is cooperative and remains safe on the unit.  1:1 interaction in private to establish rapport. Encouragement, education, & support given from staff.  PRN tylenol and vistaril  requested and will re-eval accordingly.   Safety maintained with Q 15 checks. Continue with POC.

## 2017-08-24 NOTE — Progress Notes (Signed)
DAR NOTE: Patient presents with anxious affect and depressed mood. Pt complained of not feeling well and having flue like symptoms. Pt also complained of not sleeping well last night. Pt sleep the whole day, does not attend groups, or interacting with peers in the dayroom. Pt kept on pacing during meds pass, but will not say what the problem is. Denies pain, auditory and visual hallucinations, positive for SI, contracted for safety. Rates depression at 9, hopelessness at 9, and anxiety at 9.  Maintained on routine safety checks.  Medications given as prescribed.  Support and encouragement offered as needed.  States goal for today is "staying hopeful." Will continue to monitor.
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
## 2017-08-25 NOTE — BHH Group Notes (Signed)
[facility] Group Therapy 08/25/2017 1:15pm  Type of Therapy: Mental Health Association Presentation  Participation Level: Active  Participation Quality: Attentive  Affect: Appropriate  Cognitive: Oriented  Insight: Developing/Improving  Engagement in Therapy: Engaged  Modes of Intervention: Discussion, Education and Socialization  Summary of Progress/Problems: [place] Speaker came to talk about his personal journey with mental health. The pt processed ways by which to relate to the speaker. [place] speaker provided handouts and educational information pertaining to groups and services offered by the [facility]. Pt was engaged in speaker's presentation and was receptive to resources provided.    [employer], LCSW 08/25/2017 2:52 PM

## 2017-08-25 NOTE — Progress Notes (Signed)
D    Pt reports having a good day today and looking forward to being discharged tomorrow    He interacts minimally but appropriately with staff and peers A   Verbal support given   Medications administered and effectiveness monitored  Q 15 min checks R  Pt is safe at present

## 2017-08-25 NOTE — Progress Notes (Signed)
[place] Group Notes:  (Nursing/MHT/Case Management/Adjunct)  Date:  08/25/2017  Time:  2045 Type of Therapy:  2045  Participation Level:  Active  Participation Quality:  Appropriate, Attentive, Sharing and Supportive  Affect:  Appropriate  Cognitive:  Appropriate  Insight:  Improving  Engagement in Group:  Engaged  Modes of Intervention:  Clarification, Education and Support  Summary of Progress/Problems:  Shellia Cleverly 08/25/2017, 9:53 PM

## 2017-08-25 NOTE — BHH Suicide Risk Assessment (Signed)
[place] INPATIENT:  Family/Significant Other Suicide Prevention Education  Suicide Prevention Education:  Patient Refusal for Family/Significant Other Suicide Prevention Education: The patient Isaac [name] has refused to provide written consent for family/significant other to be provided Family/Significant Other Suicide Prevention Education during admission and/or prior to discharge.  Physician notified.  SPE completed with pt, as pt refused to consent to family contact. SPI pamphlet provided to pt and pt was encouraged to share information with support network, ask questions, and talk about any concerns relating to SPE. Pt denies access to guns/firearms and verbalized understanding of information provided. Mobile Crisis information also provided to pt.   Chrystopher Stangl N Smart LCSW 08/25/2017, 11:21 AM

## 2017-08-25 NOTE — BHH Group Notes (Signed)
The focus of this group is to educate the patient on the purpose and policies of crisis stabilization and provide a format to answer questions about their admission.  The group details unit policies and expectations of patients while admitted.  Patient did not attend 0900 nurse education orientation program this morning.  Patient stayed in his room.

## 2017-08-25 NOTE — Progress Notes (Signed)
D:  Pt presents with a flat affect and depressed mood. Pt rates depression 8/10. Anxiety 8/10. Hopelessness 8/10. Pt endorses passive SI with no plan or intent. Pt verbally contracts for safety. Pt c/o a cold this morning and have been in bed all morning and afternoon. Pt given fluids and tylenol at his request for discomfort. Treatment team made aware of pt complaint.  A: Medications reviewed with pt. Verbal support provided. Pt encouraged to attend groups. 15 minute checks performed for safety. R: Pt compliant with med regimen. No side effects to meds verbalized by pt.

## 2017-08-26 DIAGNOSIS — R443 Hallucinations, unspecified: Secondary | ICD-10-CM

## 2017-08-26 MED ORDER — LEVETIRACETAM 750 MG PO TABS: 750.0000 mg | ORAL_TABLET | Freq: Two times a day (BID) | ORAL | 0 refills | Status: DC

## 2017-08-26 MED ORDER — ADULT MULTIVITAMIN W/MINERALS CH: 1.0000 | ORAL_TABLET | Freq: Every day | ORAL | 0 refills | Status: DC

## 2017-08-26 MED ORDER — HYDROXYZINE HCL 50 MG PO TABS: 50.0000 mg | ORAL_TABLET | Freq: Four times a day (QID) | ORAL | 0 refills | Status: DC | PRN

## 2017-08-26 MED ORDER — MIRTAZAPINE 15 MG PO TABS: 15.0000 mg | ORAL_TABLET | Freq: Every day | ORAL | 0 refills | Status: DC

## 2017-08-26 MED ORDER — BENAZEPRIL-HYDROCHLOROTHIAZIDE 10-12.5 MG PO TABS: 1.0000 | ORAL_TABLET | Freq: Every day | ORAL | 0 refills | Status: DC

## 2017-08-26 MED ORDER — ELVITEG-COBIC-EMTRICIT-TENOFAF 150-150-200-10 MG PO TABS: 1.0000 | ORAL_TABLET | Freq: Every day | ORAL | 0 refills | Status: DC
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
## 2017-08-26 NOTE — Progress Notes (Signed)
Adult Psychoeducational Group Note  Date:  08/26/2017 Time:  10:35 AM  Group Topic/Focus:  Healthy Communication:   The focus of this group is to discuss communication, barriers to communication, as well as healthy ways to communicate with others.  Participation Level:  Active  Participation Quality:  Appropriate  Affect:  Appropriate  Cognitive:  Alert  Insight: Improving  Engagement in Group:  Improving  Modes of Intervention:  Activity  Additional Comments:  Pt participated in group activity and discussion.  Isaac [name] Isaac [name] 08/26/2017, 10:35 AM

## 2017-08-26 NOTE — Tx Team (Signed)
Interdisciplinary Treatment and Diagnostic Plan Update  08/26/2017 Time of Session: [place] MRN: [id number]  Principal Diagnosis: MDD (major depressive disorder), recurrent severe, without psychosis ([place])  Secondary Diagnoses: Principal Problem:   MDD (major depressive disorder), recurrent severe, without psychosis ([place]) Active Problems:   HIV disease ([place])   Essential hypertension   Seizure ([place])   Alcohol abuse   Cocaine abuse ([place])   Current Medications:  Current Facility-Administered Medications  Medication Dose Route Frequency Provider Last Rate Last Dose  . acetaminophen (TYLENOL) tablet 650 mg  650 mg Oral Q6H PRN Rankin, Shuvon B, NP   650 mg at 08/25/17 1643  . alum & mag hydroxide-simeth (MAALOX/MYLANTA) 200-200-20 MG/5ML suspension 30 mL  30 mL Oral Q4H PRN Rankin, Shuvon B, NP      . benazepril (LOTENSIN) tablet 10 mg  10 mg Oral Daily Cobos, Myer Peer, MD   10 mg at 08/26/17 0804   And  . hydrochlorothiazide (MICROZIDE) capsule 12.5 mg  12.5 mg Oral Daily Cobos, Myer Peer, MD   12.5 mg at 08/26/17 0804  . elvitegravir-cobicistat-emtricitabine-tenofovir (GENVOYA) 150-150-200-10 MG tablet 1 tablet  1 tablet Oral Q breakfast Rankin, Shuvon B, NP   1 tablet at 08/26/17 0804  . feeding supplement (ENSURE ENLIVE) (ENSURE ENLIVE) liquid 237 mL  237 mL Oral BID BM Cobos, Myer Peer, MD   237 mL at 08/26/17 0958  . hydrOXYzine (ATARAX/VISTARIL) tablet 50 mg  50 mg Oral Q6H PRN Pennelope Bracken, MD   50 mg at 08/24/17 2057  . Influenza vac split quadrivalent PF (FLUARIX) injection 0.5 mL  0.5 mL Intramuscular Tomorrow-1000 Cobos, Fernando A, MD      . levETIRAcetam (KEPPRA) tablet 750 mg  750 mg Oral BID Rankin, Shuvon B, NP   750 mg at 08/26/17 0804  . magnesium hydroxide (MILK OF MAGNESIA) suspension 30 mL  30 mL Oral Daily PRN Rankin, Shuvon B, NP      . mirtazapine (REMERON) tablet 15 mg  15 mg Oral QHS Pennelope Bracken, MD   15 mg at 08/25/17  2106  . multivitamin with minerals tablet 1 tablet  1 tablet Oral Daily Rankin, Shuvon B, NP   1 tablet at 08/26/17 0804  . thiamine (B-1) injection 100 mg  100 mg Intramuscular Once Rankin, Shuvon B, NP      . thiamine (VITAMIN B-1) tablet 100 mg  100 mg Oral Daily Rankin, Shuvon B, NP   100 mg at 08/26/17 7322   PTA Medications: Medications Prior to Admission  Medication Sig Dispense Refill Last Dose  . Emollient (AVEENO ACTIVE NAT SKIN RELIEF EX) Apply topically as needed.     Marland Kitchen ibuprofen (ADVIL,MOTRIN) 400 MG tablet Take 400 mg by mouth every 6 (six) hours as needed for headache or mild pain.     . fluconazole (DIFLUCAN) 100 MG tablet Take 1 tablet (100 mg total) by mouth daily. 10 tablet 0   . levETIRAcetam (KEPPRA) 750 MG tablet TAKE 1 TABLET BY MOUTH TWICE DAILY 60 tablet 5   . mirtazapine (REMERON) 7.5 MG tablet Take 1 tablet (7.5 mg total) by mouth at bedtime. (Patient not taking: Reported on 01/26/2017) 30 tablet 0 Not Taking  . [DISCONTINUED] HYDROcodone-acetaminophen (NORCO/VICODIN) 5-325 MG tablet Take 1 tablet by mouth every 6 (six) hours as needed. (Patient not taking: Reported on 01/26/2017) 15 tablet 0 Not Taking    Patient Stressors: Health problems Marital or family conflict Medication change or noncompliance Substance abuse  Patient Strengths:  Ability for insight Average or above average intelligence Capable of independent living General fund of knowledge Motivation for treatment/growth  Treatment Modalities: Medication Management, Group therapy, Case management,  1 to 1 session with clinician, Psychoeducation, Recreational therapy.   Physician Treatment Plan for Primary Diagnosis: MDD (major depressive disorder), recurrent severe, without psychosis ([place]) Long Term Goal(s): Improvement in symptoms so as ready for discharge Improvement in symptoms so as ready for discharge   Short Term Goals: Ability to demonstrate self-control will improve Ability to identify  triggers associated with substance abuse/mental health issues will improve Ability to verbalize feelings will improve Ability to disclose and discuss suicidal ideas Compliance with prescribed medications will improve  Medication Management: Evaluate patient's response, side effects, and tolerance of medication regimen.  Therapeutic Interventions: 1 to 1 sessions, Unit Group sessions and Medication administration.  Evaluation of Outcomes: Adequate for discharge   Physician Treatment Plan for Secondary Diagnosis: Principal Problem:   MDD (major depressive disorder), recurrent severe, without psychosis ([place]) Active Problems:   HIV disease ([place])   Essential hypertension   Seizure ([place])   Alcohol abuse   Cocaine abuse ([place])  Long Term Goal(s): Improvement in symptoms so as ready for discharge Improvement in symptoms so as ready for discharge   Short Term Goals: Ability to demonstrate self-control will improve Ability to identify triggers associated with substance abuse/mental health issues will improve Ability to verbalize feelings will improve Ability to disclose and discuss suicidal ideas Compliance with prescribed medications will improve     Medication Management: Evaluate patient's response, side effects, and tolerance of medication regimen.  Therapeutic Interventions: 1 to 1 sessions, Unit Group sessions and Medication administration.  Evaluation of Outcomes: Adequate for discharge   RN Treatment Plan for Primary Diagnosis: MDD (major depressive disorder), recurrent severe, without psychosis ([place]) Long Term Goal(s): Knowledge of disease and therapeutic regimen to maintain health will improve  Short Term Goals: Ability to remain free from injury will improve, Ability to disclose and discuss suicidal ideas and Ability to identify and develop effective coping behaviors will improve  Medication Management: RN will administer medications as ordered by provider, will assess and  evaluate patient's response and provide education to patient for prescribed medication. RN will report any adverse and/or side effects to prescribing provider.  Therapeutic Interventions: 1 on 1 counseling sessions, Psychoeducation, Medication administration, Evaluate responses to treatment, Monitor vital signs and CBGs as ordered, Perform/monitor CIWA, COWS, AIMS and Fall Risk screenings as ordered, Perform wound care treatments as ordered.  Evaluation of Outcomes: Adequate for discharge   LCSW Treatment Plan for Primary Diagnosis: MDD (major depressive disorder), recurrent severe, without psychosis ([place]) Long Term Goal(s): Safe transition to appropriate next level of care at discharge, Engage patient in therapeutic group addressing interpersonal concerns.  Short Term Goals: Engage patient in aftercare planning with referrals and resources, Facilitate patient progression through stages of change regarding substance use diagnoses and concerns and Identify triggers associated with mental health/substance abuse issues  Therapeutic Interventions: Assess for all discharge needs, 1 to 1 time with Social worker, Explore available resources and support systems, Assess for adequacy in community support network, Educate family and significant other(s) on suicide prevention, Complete Psychosocial Assessment, Interpersonal group therapy.  Evaluation of Outcomes:  Adequate for discahrge   Progress in Treatment: Attending groups: Yes. Participating in groups: Yes. Taking medication as prescribed: Yes. Toleration medication: Yes. Family/Significant other contact made: SPE completed with pt; pt declined to consent to collateral contact.  Patient understands diagnosis: Yes. Discussing  patient identified problems/goals with staff: Yes. Medical problems stabilized or resolved: Yes. Denies suicidal/homicidal ideation: Yes. Issues/concerns per patient self-inventory: No. Other: n/a   New problem(s)  identified: No, Describe:  n/a  New Short Term/Long Term Goal(s): detox, medication management for mood stabilization; elimination of SI thoughts, development of comprehensive mental wellness/sobriety plan.   Discharge Plan or Barriers: Pt declined referral to [facility]. CSW arranged for Clearnce Sorrel from [place] to visit patient while hospitalized to discuss possibly transitioning into his halfway house at discharge. Pt plans to go to his niece's for the holiday [facility]) and move in with Clearnce Sorrel the day after Christmas. [facility] for outpatient care-appt made. Pt also provided with AA/NA list and [place] pamphlet for additional community supports.   Reason for Continuation of Hospitalization: none  Estimated Length of Stay: Friday, 08/26/17  Attendees: Patient: 08/26/2017 10:29 AM  Physician: Dr. Nancy Fetter MD; Dr. Parke Poisson MD 08/26/2017 10:29 AM  Nursing:  Linna Hoff RN; [facility] RN 08/26/2017 10:29 AM  RN Care Manager: x 08/26/2017 10:29 AM  Social Worker: Press photographer, LCSW 08/26/2017 10:29 AM  Recreational Therapist: x 08/26/2017 10:29 AM  Other: Marvia Pickles NP; Lindell Spar NP 08/26/2017 10:29 AM  Other:  08/26/2017 10:29 AM  Other: 08/26/2017 10:29 AM    Scribe for Treatment Team: [place], LCSW 08/26/2017 10:29 AM

## 2017-08-26 NOTE — Progress Notes (Addendum)
Recreation Therapy Notes  Date: 08/26/17 Time: 0930 Location: 300 Hall Dayroom  Group Topic: Stress Management  Goal Area(s) Addresses:  Patient will verbalize importance of using healthy stress management.  Patient will identify positive emotions associated with healthy stress management.   Intervention: Stress Management  Activity :  Progressive Muscle Relaxation.  LRT read a script to lead patients through the stress management technique of progressive muscle relaxation.  Patients were to follow along to engage in the activity as LRT read the script.  Education:  Stress Management, Discharge Planning.   Education Outcome: Acknowledges edcuation/In group clarification offered/Needs additional education  Clinical Observations/Feedback: Pt did not attend group.    Victorino Sparrow, LRT/CTRS          Ria Comment, Makai Dumond A 08/26/2017 11:18 AM
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
## 2017-08-26 NOTE — Progress Notes (Signed)
  [facility] Adult Case Management Discharge Plan :  Will you be returning to the same living situation after discharge:  No.Pt plans to stay with niece in [place] through holiday and will go to [facility] halfway house after this [facility]).  At discharge, do you have transportation home?: Yes,  bus pass and part bus pass provided to pt.  Do you have the ability to pay for your medications: Yes,  mental health  Release of information consent forms completed and submitted to medical records by CSW.  Patient to Follow up at: Follow-up Information    [facility] Follow up on 09/01/2017.   Specialty:  Behavioral Health Why:  Hospital follow-up on Thursday at 1:15PM. Please bring: photo ID, social security card, hospital discharge paperwork, and any proof of income if you have it. Thank you.  Contact information: [place] [address] [phone]           Next level of care provider has access to [place] and Suicide Prevention discussed: Yes,  SPE completed with pt; pt declined to consent to family contact. [facility] pamphlet and Mobile Crisis information provided to pt.   Have you used any form of tobacco in the last 30 days? (Cigarettes, Smokeless Tobacco, Cigars, and/or Pipes): Yes  Has patient been referred to the Quitline?: Patient refused referral  Patient has been referred for addiction treatment: Yes  [employer], LCSW 08/26/2017, 10:28 AM

## 2017-08-26 NOTE — Progress Notes (Signed)
Nursing Discharge Note 08/26/2017 [id number]  Data Reports sleeping poor with PRN sleep med.  Rates depression 5/10, hopelessness 5/10, and anxiety 5/10. Affect wide ranged.  Denies HI, SI, AVH.  This AM patient stated "I know I was asking about discharging today but I don't think that's a good plan anymore. I  Know I'll get back to using.  Can you ask them if I can stay until after the holidays?"  Request forwarded to tx team.  Received discharge orders.  Action Spoke with patient 1:1, nurse offered support to patient throughout shift. .  Reviewed medications, discharge instructions, and follow up appointments with patient. Medication samples and scripts reviewed and given to patient.  Paperwork, AVS, SRA, and transition record handed to patient.   Escorted off of unit at 1145. Belongings returned per belongings form.  Discharged to lobby with 2 city bus passes, 2 part bus passes, box lunch, and a gatorade.    Response Verbalized understanding of discharge teaching. Agrees to contact someone or 911 with thoughts/intent to harm self or others.    To follow up per AVS.

## 2017-08-31 ENCOUNTER — Other Ambulatory Visit: Payer: Self-pay

## 2017-09-12 ENCOUNTER — Telehealth: Payer: Self-pay | Admitting: *Deleted

## 2017-09-12 NOTE — Telephone Encounter (Signed)
Patient left message wanting to reschedule his appointment. Appointment canceled and tried to call the patient to reschedule but patient did not answer his phone so unable to schedule. Will wait for him to call the office for new appointment.

## 2017-09-13 ENCOUNTER — Ambulatory Visit: Payer: Self-pay | Admitting: Infectious Diseases

## 2017-09-13 ENCOUNTER — Telehealth: Payer: Self-pay | Admitting: *Deleted

## 2017-09-13 NOTE — Telephone Encounter (Signed)
Patient currently hospitalized, discharge expected 09/16/17.  [facility] to draw HIV labs and fax to [place].  Patient to call for appointment once discharged.

## 2017-09-30 ENCOUNTER — Telehealth: Payer: Self-pay | Admitting: *Deleted

## 2017-09-30 NOTE — Telephone Encounter (Signed)
Patient called for an update on shipment of viagra refill. It has not arrived at [facility] as of 1/25. Please call at [phone]. Landis Gandy, RN

## 2017-10-10 ENCOUNTER — Ambulatory Visit (INDEPENDENT_AMBULATORY_CARE_PROVIDER_SITE_OTHER): Payer: Self-pay | Admitting: Infectious Diseases

## 2017-10-10 ENCOUNTER — Encounter: Payer: Self-pay | Admitting: Infectious Diseases

## 2017-10-10 VITALS — BP 154/93 | HR 86 | Temp 98.2°F | Wt 163.0 lb

## 2017-10-10 DIAGNOSIS — Z79899 Other long term (current) drug therapy: Secondary | ICD-10-CM

## 2017-10-10 DIAGNOSIS — I10 Essential (primary) hypertension: Secondary | ICD-10-CM

## 2017-10-10 DIAGNOSIS — F1014 Alcohol abuse with alcohol-induced mood disorder: Secondary | ICD-10-CM

## 2017-10-10 DIAGNOSIS — Z113 Encounter for screening for infections with a predominantly sexual mode of transmission: Secondary | ICD-10-CM

## 2017-10-10 DIAGNOSIS — B182 Chronic viral hepatitis C: Secondary | ICD-10-CM

## 2017-10-10 DIAGNOSIS — F1414 Cocaine abuse with cocaine-induced mood disorder: Secondary | ICD-10-CM

## 2017-10-10 DIAGNOSIS — Q282 Arteriovenous malformation of cerebral vessels: Secondary | ICD-10-CM

## 2017-10-10 DIAGNOSIS — B20 Human immunodeficiency virus [HIV] disease: Secondary | ICD-10-CM

## 2017-10-10 NOTE — Assessment & Plan Note (Signed)
Has f/u with Neuro at [facility], hopefully his seizures will decrease in frequency.

## 2017-10-10 NOTE — Assessment & Plan Note (Signed)
He will continue to f/u with PCP.  Cont ACE-HCTZ

## 2017-10-10 NOTE — Assessment & Plan Note (Signed)
Has been staying clean for last 3 weeks.  Encourage f/u at [facility]

## 2017-10-10 NOTE — Assessment & Plan Note (Signed)
Has been staying clean for last 3 weeks.  Encourage f/u at [facility]

## 2017-10-10 NOTE — Assessment & Plan Note (Signed)
Will mark as resolve.d

## 2017-10-10 NOTE — Assessment & Plan Note (Addendum)
Appears to be doing well Appreciate [facility] f/u Refuses flu vax Accepts mening vax Offered/refused condoms. has condoms already.  Set up for colon.  Check labs today Will see him back in 6 months

## 2017-10-10 NOTE — Progress Notes (Signed)
   Subjective:    Patient ID: Isaac [name], male    DOB: May 29, 1961, 57 y.o.   MRN: [id number]  HPI 57 yo M with hx of HIV+, Hep C (1a F0/F1 [11-2014]) treated and completed 03-2016, HTN, seizures and L frontal lobe AVM,  and ongoing  substance abuse (ETOH, drug addiction).  Has been on atripla --> descovey/tivicay ---> genvoya He denies missed meds since his last visit.  He had gamma knife surgery to his AVM on 04-2017.  He has had several ED visits since that time for detox and mental health. As well, he was homeless for some time.  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  Over last 3 weeks has been living with his niece, he believes he is functioning better.  He has been going to [facility]   HIV 1 RNA Quant (copies/mL)  Date Value  01/27/2017 <20 DETECTED (A)  01/12/2017 <20 DETECTED (A)  05/11/2016 <20   CD4 T Cell Abs (/uL)  Date Value  01/27/2017 630  01/12/2017 640  05/11/2016 370 (L)    Review of Systems  Constitutional: Negative for appetite change, chills, fever and unexpected weight change.  HENT: Positive for postnasal drip and rhinorrhea.   Gastrointestinal: Negative for constipation and diarrhea.  Genitourinary: Negative for difficulty urinating.  Neurological: Positive for seizures (last early Dec. has f/u with neuro on 2-18 at [facility]). Negative for headaches.  Psychiatric/Behavioral: Positive for dysphoric mood.  Please see HPI. All other systems reviewed and negative.     Objective:   Physical Exam  Constitutional: He is oriented to person, place, and time. He appears well-developed and well-nourished.  HENT:  Mouth/Throat: No oropharyngeal exudate.  Eyes: EOM are normal. Pupils are equal, round, and reactive to light.  Neck: Neck supple.  Cardiovascular: Normal rate, regular rhythm and normal heart sounds.  Pulmonary/Chest: Effort normal and breath sounds  normal.  Abdominal: Soft. Bowel sounds are normal. There is no tenderness. There is no rebound.  Musculoskeletal: He exhibits no edema.  Lymphadenopathy:    He has no cervical adenopathy.  Neurological: He is alert and oriented to person, place, and time.  Psychiatric: He has a normal mood and affect.      Assessment & Plan:

## 2017-10-11 ENCOUNTER — Encounter: Payer: Self-pay | Admitting: Internal Medicine

## 2017-10-11 LAB — CBC
HCT: 40.3 % (ref 38.5–50.0)
HEMOGLOBIN: 12.6 g/dL — AB (ref 13.2–17.1)
MCH: 26.1 pg — ABNORMAL LOW (ref 27.0–33.0)
MCHC: 31.3 g/dL — ABNORMAL LOW (ref 32.0–36.0)
MCV: 83.4 fL (ref 80.0–100.0)
MPV: 8 fL (ref 7.5–12.5)
Platelets: 361 10*3/uL (ref 140–400)
RBC: 4.83 10*6/uL (ref 4.20–5.80)
RDW: 12.2 % (ref 11.0–15.0)
WBC: 5.6 10*3/uL (ref 3.8–10.8)

## 2017-10-11 LAB — COMPREHENSIVE METABOLIC PANEL
AG Ratio: 1.4 (calc) (ref 1.0–2.5)
ALBUMIN MSPROF: 4.1 g/dL (ref 3.6–5.1)
ALT: 19 U/L (ref 9–46)
AST: 24 U/L (ref 10–35)
Alkaline phosphatase (APISO): 50 U/L (ref 40–115)
BILIRUBIN TOTAL: 0.4 mg/dL (ref 0.2–1.2)
BUN: 18 mg/dL (ref 7–25)
CALCIUM: 9.3 mg/dL (ref 8.6–10.3)
CO2: 27 mmol/L (ref 20–32)
CREATININE: 1.16 mg/dL (ref 0.70–1.33)
Chloride: 105 mmol/L (ref 98–110)
Globulin: 3 g/dL (calc) (ref 1.9–3.7)
Glucose, Bld: 69 mg/dL (ref 65–99)
Potassium: 4.2 mmol/L (ref 3.5–5.3)
SODIUM: 141 mmol/L (ref 135–146)
TOTAL PROTEIN: 7.1 g/dL (ref 6.1–8.1)

## 2017-10-11 LAB — T-HELPER CELL (CD4) - (RCID CLINIC ONLY)
CD4 T CELL ABS: 620 /uL (ref 400–2700)
CD4 T CELL HELPER: 28 % — AB (ref 33–55)

## 2017-10-11 LAB — LIPID PANEL
CHOL/HDL RATIO: 3.6 (calc) (ref <5.0)
Cholesterol: 153 mg/dL (ref <200)
HDL: 43 mg/dL (ref >40)
LDL CHOLESTEROL (CALC): 81 mg/dL
Non-HDL Cholesterol (Calc): 110 mg/dL (calc) (ref <130)
Triglycerides: 197 mg/dL — ABNORMAL HIGH (ref <150)

## 2017-10-11 LAB — RPR: RPR: NONREACTIVE

## 2017-10-11 LAB — URINE CYTOLOGY ANCILLARY ONLY
Chlamydia: NEGATIVE
NEISSERIA GONORRHEA: NEGATIVE

## 2017-10-12 LAB — HIV-1 RNA QUANT-NO REFLEX-BLD
HIV 1 RNA Quant: 51 copies/mL — ABNORMAL HIGH
HIV-1 RNA Quant, Log: 1.71 Log copies/mL — ABNORMAL HIGH

## 2017-10-13 ENCOUNTER — Encounter: Payer: Self-pay | Admitting: Infectious Diseases

## 2017-10-28 ENCOUNTER — Encounter: Payer: Self-pay | Admitting: Internal Medicine

## 2017-11-02 ENCOUNTER — Telehealth: Payer: Self-pay | Admitting: *Deleted

## 2017-11-02 NOTE — Telephone Encounter (Signed)
Office visit to discuss colon cancer screening and determine which test is best for him

## 2017-11-02 NOTE — Telephone Encounter (Signed)
Spoke with pt- explained we need to cancel PV and scheduled colon and make an OV per Dr Hilarie Fredrickson due to seizures 75-1025 and complicated med hx.  Scheduled colon 5-13 at 230 pm with pt- mailed AVS with date time location to pt. Verified address to mail. Cancelled colon and Pv as scheduled  Lelan Pons PV

## 2017-11-02 NOTE — Telephone Encounter (Signed)
Dr Hilarie Fredrickson,  This gentleman is on the PV schedule for 3-19 with a direct colon with you 12-06-17. He has no GI hx.  He has a significant hx of recurrent suicidal ideations, Alcohol and cocaine abuse, Hep C, HIV, HTN, Cerebral AV malformation, and Depression, and a hx of seizures.  His last documented seizure that I could find with his ID MD note was December 2018. He is on Keppra .  He has had frequent ED visits in his chart. Do you want him to have an OV or is he okay to be scheduled for a direct?  Please advise and thanks for your time,  Lelan Pons

## 2017-12-06 ENCOUNTER — Encounter: Payer: Self-pay | Admitting: Internal Medicine

## 2017-12-26 ENCOUNTER — Encounter: Payer: Self-pay | Admitting: *Deleted

## 2018-01-04 ENCOUNTER — Telehealth: Payer: Self-pay | Admitting: Behavioral Health

## 2018-01-04 NOTE — Telephone Encounter (Signed)
Patient called stating he is trying to get in touch with Marena Chancy for his refill for Viagra.  Caryl Pina is currently on the phone and the moment .  Informed patient of this and he verbalized understanding. Pricilla Riffle RN

## 2018-01-16 ENCOUNTER — Ambulatory Visit: Payer: Self-pay | Admitting: Internal Medicine

## 2018-02-16 ENCOUNTER — Encounter: Payer: Self-pay | Admitting: Infectious Diseases

## 2018-02-20 ENCOUNTER — Other Ambulatory Visit: Payer: Self-pay | Admitting: Infectious Diseases

## 2018-02-20 DIAGNOSIS — R569 Unspecified convulsions: Secondary | ICD-10-CM

## 2018-04-11 ENCOUNTER — Other Ambulatory Visit: Payer: Self-pay

## 2018-04-11 DIAGNOSIS — B20 Human immunodeficiency virus [HIV] disease: Secondary | ICD-10-CM

## 2018-04-12 LAB — T-HELPER CELL (CD4) - (RCID CLINIC ONLY)
CD4 % Helper T Cell: 24 % — ABNORMAL LOW (ref 33–55)
CD4 T CELL ABS: 380 /uL — AB (ref 400–2700)

## 2018-04-13 ENCOUNTER — Encounter: Payer: Self-pay | Admitting: Infectious Diseases

## 2018-04-13 LAB — CBC
HEMATOCRIT: 36.8 % — AB (ref 38.5–50.0)
Hemoglobin: 12 g/dL — ABNORMAL LOW (ref 13.2–17.1)
MCH: 28 pg (ref 27.0–33.0)
MCHC: 32.6 g/dL (ref 32.0–36.0)
MCV: 85.8 fL (ref 80.0–100.0)
MPV: 8.6 fL (ref 7.5–12.5)
Platelets: 300 10*3/uL (ref 140–400)
RBC: 4.29 10*6/uL (ref 4.20–5.80)
RDW: 12 % (ref 11.0–15.0)
WBC: 4.6 10*3/uL (ref 3.8–10.8)

## 2018-04-13 LAB — COMPREHENSIVE METABOLIC PANEL
AG Ratio: 1.5 (calc) (ref 1.0–2.5)
ALBUMIN MSPROF: 4.4 g/dL (ref 3.6–5.1)
ALKALINE PHOSPHATASE (APISO): 66 U/L (ref 40–115)
ALT: 14 U/L (ref 9–46)
AST: 22 U/L (ref 10–35)
BILIRUBIN TOTAL: 0.4 mg/dL (ref 0.2–1.2)
BUN: 20 mg/dL (ref 7–25)
CALCIUM: 9.4 mg/dL (ref 8.6–10.3)
CO2: 25 mmol/L (ref 20–32)
Chloride: 104 mmol/L (ref 98–110)
Creat: 1.32 mg/dL (ref 0.70–1.33)
GLOBULIN: 2.9 g/dL (ref 1.9–3.7)
Glucose, Bld: 92 mg/dL (ref 65–99)
POTASSIUM: 3.7 mmol/L (ref 3.5–5.3)
SODIUM: 138 mmol/L (ref 135–146)
Total Protein: 7.3 g/dL (ref 6.1–8.1)

## 2018-04-13 LAB — HIV-1 RNA QUANT-NO REFLEX-BLD
HIV 1 RNA QUANT: 115 {copies}/mL — AB
HIV-1 RNA Quant, Log: 2.06 Log copies/mL — ABNORMAL HIGH

## 2018-04-25 ENCOUNTER — Ambulatory Visit: Payer: Self-pay | Admitting: Infectious Diseases

## 2018-04-25 ENCOUNTER — Telehealth: Payer: Self-pay

## 2018-04-25 NOTE — Telephone Encounter (Signed)
Patient called today to reschedule an appointment he has with Dr. Johnnye Sima. Patient stated he is having a family crisis and would not be able to make his appointment today. Patient is rescheduled for 9/6. He has enough mediation to last him until his next appointment. [place]

## 2018-05-12 ENCOUNTER — Ambulatory Visit: Payer: Self-pay | Admitting: Infectious Diseases

## 2018-05-16 ENCOUNTER — Ambulatory Visit (INDEPENDENT_AMBULATORY_CARE_PROVIDER_SITE_OTHER): Payer: Self-pay | Admitting: Infectious Diseases

## 2018-05-16 ENCOUNTER — Encounter: Payer: Self-pay | Admitting: Infectious Diseases

## 2018-05-16 VITALS — BP 145/87 | HR 75 | Temp 98.0°F | Wt 165.0 lb

## 2018-05-16 DIAGNOSIS — Z113 Encounter for screening for infections with a predominantly sexual mode of transmission: Secondary | ICD-10-CM

## 2018-05-16 DIAGNOSIS — G40909 Epilepsy, unspecified, not intractable, without status epilepticus: Secondary | ICD-10-CM

## 2018-05-16 DIAGNOSIS — Z79899 Other long term (current) drug therapy: Secondary | ICD-10-CM

## 2018-05-16 DIAGNOSIS — B20 Human immunodeficiency virus [HIV] disease: Secondary | ICD-10-CM

## 2018-05-16 DIAGNOSIS — Z23 Encounter for immunization: Secondary | ICD-10-CM

## 2018-05-16 MED ORDER — LEVETIRACETAM 750 MG PO TABS: 750.0000 mg | ORAL_TABLET | Freq: Two times a day (BID) | ORAL | 0 refills | Status: DC

## 2018-05-16 NOTE — Assessment & Plan Note (Addendum)
Will refer him to PCP as he asks Will give him PCV and flu today States his girlfriend is on prevention medication.  He is using condoms Will see him back in 6 weeks after we recheck his CD4 and HIV RNA today.

## 2018-05-16 NOTE — Assessment & Plan Note (Signed)
Will refill his keppra Get him in with PCP

## 2018-05-16 NOTE — Addendum Note (Signed)
Addended by: Aundria Rud on: 05/16/2018 12:03 PM   Modules accepted: Orders

## 2018-05-16 NOTE — Progress Notes (Signed)
   Subjective:    Patient ID: Isaac [name], male    DOB: [birth year], 57 y.o.   MRN: [id number]  HPI 57yo M with hx of HIV+, Hep C (1a F0/F1 [11-2014]) treated and completed 03-2016, HTN, seizures and L frontal lobe AVM,  and ongoing  substance abuse (ETOH, drug addiction).  Has been on atripla --> descovey/tivicay ---> genvoya He had gamma knife surgery to his AVM on 04-2017.  He has had several ED visits since that time for detox and mental health. As well, he was homeless for some time.  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  He has been working on getting disability.  Worried about losing muscle mass.  Not sure how his genvoya has been working. Denies missed art.   HIV 1 RNA Quant (copies/mL)  Date Value  04/11/2018 115 (H)  10/10/2017 51 (H)  01/27/2017 <20 DETECTED (A)   CD4 T Cell Abs (/uL)  Date Value  04/11/2018 380 (L)  10/10/2017 620  01/27/2017 630     Review of Systems  Constitutional: Negative for appetite change, chills, fever and unexpected weight change.  Gastrointestinal: Negative for constipation and diarrhea.  Endocrine: Positive for polyuria.  Genitourinary: Positive for urgency. Negative for difficulty urinating.  Musculoskeletal: Positive for arthralgias.  worried about dry skin Wants PCP Pain in shoulders and knees. Denies strenuous activity.  Please see HPI. All other systems reviewed and negative.     Objective:   Physical Exam  Constitutional: He appears well-developed and well-nourished.  HENT:  Mouth/Throat: No oropharyngeal exudate.  Eyes: Pupils are equal, round, and reactive to light. EOM are normal.  Neck: Normal range of motion. Neck supple.  Cardiovascular: Normal rate, regular rhythm and normal heart sounds.  Pulmonary/Chest: Effort normal and breath sounds normal.  Abdominal: Soft. Bowel sounds are normal. There is no tenderness. There  is no guarding.  Musculoskeletal: He exhibits no edema.  Lymphadenopathy:    He has no cervical adenopathy.       Assessment & Plan:

## 2018-05-18 LAB — HIV-1 RNA QUANT-NO REFLEX-BLD
HIV 1 RNA QUANT: 172 {copies}/mL — AB
HIV-1 RNA Quant, Log: 2.24 Log copies/mL — ABNORMAL HIGH

## 2018-05-18 LAB — COMPREHENSIVE METABOLIC PANEL
AG Ratio: 1.5 (calc) (ref 1.0–2.5)
ALKALINE PHOSPHATASE (APISO): 67 U/L (ref 40–115)
ALT: 18 U/L (ref 9–46)
AST: 25 U/L (ref 10–35)
Albumin: 4.2 g/dL (ref 3.6–5.1)
BUN: 11 mg/dL (ref 7–25)
CO2: 27 mmol/L (ref 20–32)
Calcium: 9.1 mg/dL (ref 8.6–10.3)
Chloride: 104 mmol/L (ref 98–110)
Creat: 1.15 mg/dL (ref 0.70–1.33)
GLUCOSE: 79 mg/dL (ref 65–99)
Globulin: 2.8 g/dL (calc) (ref 1.9–3.7)
Potassium: 3.6 mmol/L (ref 3.5–5.3)
Sodium: 139 mmol/L (ref 135–146)
Total Bilirubin: 0.4 mg/dL (ref 0.2–1.2)
Total Protein: 7 g/dL (ref 6.1–8.1)

## 2018-05-18 LAB — CBC
HCT: 38.9 % (ref 38.5–50.0)
Hemoglobin: 12.9 g/dL — ABNORMAL LOW (ref 13.2–17.1)
MCH: 28.2 pg (ref 27.0–33.0)
MCHC: 33.2 g/dL (ref 32.0–36.0)
MCV: 84.9 fL (ref 80.0–100.0)
MPV: 8.5 fL (ref 7.5–12.5)
PLATELETS: 345 10*3/uL (ref 140–400)
RBC: 4.58 10*6/uL (ref 4.20–5.80)
RDW: 11.8 % (ref 11.0–15.0)
WBC: 4.6 10*3/uL (ref 3.8–10.8)

## 2018-06-12 ENCOUNTER — Other Ambulatory Visit: Payer: Self-pay | Admitting: *Deleted

## 2018-06-12 DIAGNOSIS — N529 Male erectile dysfunction, unspecified: Secondary | ICD-10-CM

## 2018-06-12 MED ORDER — SILDENAFIL CITRATE 100 MG PO TABS: 100.0000 mg | ORAL_TABLET | Freq: Every day | ORAL | 1 refills | Status: DC | PRN

## 2018-07-05 ENCOUNTER — Ambulatory Visit: Payer: Self-pay | Admitting: Infectious Diseases

## 2018-07-08 ENCOUNTER — Other Ambulatory Visit: Payer: Self-pay | Admitting: Infectious Diseases

## 2018-07-08 DIAGNOSIS — B20 Human immunodeficiency virus [HIV] disease: Secondary | ICD-10-CM

## 2018-07-20 ENCOUNTER — Ambulatory Visit: Payer: Self-pay | Admitting: Infectious Diseases

## 2018-07-25 ENCOUNTER — Ambulatory Visit (INDEPENDENT_AMBULATORY_CARE_PROVIDER_SITE_OTHER): Payer: Self-pay | Admitting: Infectious Diseases

## 2018-07-25 ENCOUNTER — Encounter: Payer: Self-pay | Admitting: Infectious Diseases

## 2018-07-25 VITALS — BP 145/96 | HR 99 | Temp 98.2°F | Wt 166.0 lb

## 2018-07-25 DIAGNOSIS — Z113 Encounter for screening for infections with a predominantly sexual mode of transmission: Secondary | ICD-10-CM

## 2018-07-25 DIAGNOSIS — I10 Essential (primary) hypertension: Secondary | ICD-10-CM

## 2018-07-25 DIAGNOSIS — K1370 Unspecified lesions of oral mucosa: Secondary | ICD-10-CM

## 2018-07-25 DIAGNOSIS — F332 Major depressive disorder, recurrent severe without psychotic features: Secondary | ICD-10-CM

## 2018-07-25 DIAGNOSIS — Z79899 Other long term (current) drug therapy: Secondary | ICD-10-CM

## 2018-07-25 DIAGNOSIS — F17200 Nicotine dependence, unspecified, uncomplicated: Secondary | ICD-10-CM

## 2018-07-25 DIAGNOSIS — B20 Human immunodeficiency virus [HIV] disease: Secondary | ICD-10-CM

## 2018-07-25 MED ORDER — BENAZEPRIL-HYDROCHLOROTHIAZIDE 10-12.5 MG PO TABS: 1.0000 | ORAL_TABLET | Freq: Every day | ORAL | 3 refills | Status: DC

## 2018-07-25 NOTE — Assessment & Plan Note (Signed)
Was elevated today. Has been off rx.  He would like med refill, would like to get short refill today here. Will speak with pharm.

## 2018-07-25 NOTE — Progress Notes (Signed)
   Subjective:    Patient ID: Isaac [name], male    DOB: 04/18/1961, 57 y.o.   MRN: [id number]  HPI 57yo M with hx of HIV+, Hep C (1a F0/F1 [11-2014]) treated and completed 03-2016,HTN, seizures and L frontal lobe AVM, and ongoing substance abuse (ETOH, drug addiction).  Has been on atripla-->descovey/tivicay ---> genvoya He had gamma knife surgery to his AVM on 04-2017.  He has had several ED visits since that time for detox and mental health. As well, he was homeless for some time.  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  Has been depressed due to "life issues". Has been sleeping well. Has been eating well. No problems with genvoya. Denies missed doses.  Has been seen by [facility] in past but needs to re-sign up. Has prev worked with [facility].   HIV 1 RNA Quant (copies/mL)  Date Value  05/16/2018 172 (H)  04/11/2018 115 (H)  10/10/2017 51 (H)   CD4 T Cell Abs (/uL)  Date Value  04/11/2018 380 (L)  10/10/2017 620  01/27/2017 630    Review of Systems  Constitutional: Negative for appetite change and unexpected weight change.  Respiratory: Negative for cough and shortness of breath.   Gastrointestinal: Negative for abdominal pain, constipation and diarrhea.  Psychiatric/Behavioral: Positive for dysphoric mood. Negative for sleep disturbance.  has been off his anti-htn.  Please see HPI. All other systems reviewed and negative.     Objective:   Physical Exam  Constitutional: He is oriented to person, place, and time. He appears well-developed and well-nourished.  HENT:  Mouth/Throat: No oropharyngeal exudate.  Small ulcer on L lateral mucosa. Small raised lesion distal to this (? Wart).   Eyes: Pupils are equal, round, and reactive to light. EOM are normal.  Neck: Normal range of motion. Neck supple.  Cardiovascular: Normal rate, regular rhythm and normal heart sounds.    Pulmonary/Chest: Effort normal and breath sounds normal.  Abdominal: Soft. Bowel sounds are normal. He exhibits no distension. There is no tenderness.  Musculoskeletal: He exhibits no edema.  Lymphadenopathy:    He has no cervical adenopathy.  Neurological: He is alert and oriented to person, place, and time.  Psychiatric: He has a normal mood and affect.       Assessment & Plan:

## 2018-07-25 NOTE — Assessment & Plan Note (Signed)
He will f/u at [facility]

## 2018-07-25 NOTE — Assessment & Plan Note (Signed)
Encourage to quit

## 2018-07-25 NOTE — Assessment & Plan Note (Addendum)
Some concern over blip and drop in CD4 with sworn adherence.  Will recheck today with genotype.  Will see him back in 3 months.  Has gotten flu and pcv

## 2018-07-25 NOTE — Assessment & Plan Note (Signed)
Will get him in with dental

## 2018-07-29 LAB — HIV-1 RNA ULTRAQUANT REFLEX TO GENTYP+
HIV 1 RNA QUANT: 132 {copies}/mL — AB
HIV-1 RNA Quant, Log: 2.12 Log copies/mL — ABNORMAL HIGH

## 2018-08-10 ENCOUNTER — Other Ambulatory Visit: Payer: Self-pay | Admitting: *Deleted

## 2018-08-10 DIAGNOSIS — B20 Human immunodeficiency virus [HIV] disease: Secondary | ICD-10-CM

## 2018-08-10 MED ORDER — ELVITEG-COBIC-EMTRICIT-TENOFAF 150-150-200-10 MG PO TABS: 1.0000 | ORAL_TABLET | Freq: Every day | ORAL | 5 refills | Status: DC

## 2018-09-05 ENCOUNTER — Telehealth: Payer: Self-pay | Admitting: *Deleted

## 2018-09-05 NOTE — Telephone Encounter (Signed)
Patient called to get refill on Viagra from [place]. Had [facility] said his refills have been exhausted. Patient informed and he will call the company himself to make sure. Advised him was told his last refill 06/13/18 was 3 month which takes him to 08/2018 and that completes him year. He thanked me for follow up and advised he will call me back if he gets any new information.

## 2018-09-08 ENCOUNTER — Other Ambulatory Visit: Payer: Self-pay | Admitting: *Deleted

## 2018-09-08 DIAGNOSIS — N529 Male erectile dysfunction, unspecified: Secondary | ICD-10-CM

## 2018-09-08 MED ORDER — SILDENAFIL CITRATE 100 MG PO TABS: 100.0000 mg | ORAL_TABLET | Freq: Every day | ORAL | 1 refills | Status: DC | PRN

## 2018-09-26 ENCOUNTER — Other Ambulatory Visit: Payer: Medicaid Other

## 2018-09-26 ENCOUNTER — Other Ambulatory Visit (HOSPITAL_COMMUNITY)
Admission: RE | Admit: 2018-09-26 | Discharge: 2018-09-26 | Disposition: A | Discharge status: Home / Self Care | Payer: Self-pay | Source: Ambulatory Visit | Attending: Infectious Diseases | Admitting: Infectious Diseases

## 2018-09-26 DIAGNOSIS — Z113 Encounter for screening for infections with a predominantly sexual mode of transmission: Secondary | ICD-10-CM | POA: Insufficient documentation

## 2018-09-26 DIAGNOSIS — B20 Human immunodeficiency virus [HIV] disease: Secondary | ICD-10-CM

## 2018-09-26 DIAGNOSIS — Z79899 Other long term (current) drug therapy: Secondary | ICD-10-CM

## 2018-09-27 LAB — T-HELPER CELL (CD4) - (RCID CLINIC ONLY)
CD4 % Helper T Cell: 23 % — ABNORMAL LOW (ref 33–55)
CD4 T CELL ABS: 430 /uL (ref 400–2700)

## 2018-09-28 LAB — CBC
HCT: 41.1 % (ref 38.5–50.0)
Hemoglobin: 13.6 g/dL (ref 13.2–17.1)
MCH: 27.6 pg (ref 27.0–33.0)
MCHC: 33.1 g/dL (ref 32.0–36.0)
MCV: 83.4 fL (ref 80.0–100.0)
MPV: 8.4 fL (ref 7.5–12.5)
PLATELETS: 319 10*3/uL (ref 140–400)
RBC: 4.93 10*6/uL (ref 4.20–5.80)
RDW: 12.1 % (ref 11.0–15.0)
WBC: 4.9 10*3/uL (ref 3.8–10.8)

## 2018-09-28 LAB — COMPREHENSIVE METABOLIC PANEL
AG Ratio: 1.4 (calc) (ref 1.0–2.5)
ALBUMIN MSPROF: 4.5 g/dL (ref 3.6–5.1)
ALKALINE PHOSPHATASE (APISO): 61 U/L (ref 40–115)
ALT: 24 U/L (ref 9–46)
AST: 31 U/L (ref 10–35)
BUN: 19 mg/dL (ref 7–25)
CHLORIDE: 103 mmol/L (ref 98–110)
CO2: 28 mmol/L (ref 20–32)
CREATININE: 1.23 mg/dL (ref 0.70–1.33)
Calcium: 9.6 mg/dL (ref 8.6–10.3)
GLOBULIN: 3.3 g/dL (ref 1.9–3.7)
GLUCOSE: 83 mg/dL (ref 65–99)
Potassium: 4 mmol/L (ref 3.5–5.3)
Sodium: 139 mmol/L (ref 135–146)
Total Bilirubin: 0.5 mg/dL (ref 0.2–1.2)
Total Protein: 7.8 g/dL (ref 6.1–8.1)

## 2018-09-28 LAB — LIPID PANEL
Cholesterol: 205 mg/dL — ABNORMAL HIGH (ref <200)
HDL: 41 mg/dL (ref >40)
LDL Cholesterol (Calc): 142 mg/dL (calc) — ABNORMAL HIGH
NON-HDL CHOLESTEROL (CALC): 164 mg/dL — AB (ref <130)
Total CHOL/HDL Ratio: 5 (calc) — ABNORMAL HIGH (ref <5.0)
Triglycerides: 108 mg/dL (ref <150)

## 2018-09-28 LAB — RPR: RPR Ser Ql: NONREACTIVE

## 2018-09-28 LAB — URINE CYTOLOGY ANCILLARY ONLY
Chlamydia: NEGATIVE
Neisseria Gonorrhea: NEGATIVE

## 2018-09-28 LAB — HIV-1 RNA QUANT-NO REFLEX-BLD
HIV 1 RNA Quant: 350 copies/mL — ABNORMAL HIGH
HIV-1 RNA Quant, Log: 2.54 Log copies/mL — ABNORMAL HIGH

## 2018-10-12 ENCOUNTER — Other Ambulatory Visit: Payer: Medicaid Other

## 2018-10-12 ENCOUNTER — Encounter: Payer: Self-pay | Admitting: Infectious Diseases

## 2018-10-26 ENCOUNTER — Encounter: Payer: Self-pay | Admitting: Infectious Diseases

## 2018-11-22 ENCOUNTER — Ambulatory Visit: Payer: Self-pay | Admitting: Infectious Diseases

## 2018-11-27 ENCOUNTER — Ambulatory Visit: Payer: Self-pay | Admitting: Infectious Diseases

## 2019-01-09 ENCOUNTER — Ambulatory Visit: Payer: Self-pay | Admitting: Infectious Diseases

## 2019-01-18 ENCOUNTER — Ambulatory Visit (INDEPENDENT_AMBULATORY_CARE_PROVIDER_SITE_OTHER): Payer: Medicaid Other | Admitting: Infectious Diseases

## 2019-01-18 ENCOUNTER — Other Ambulatory Visit: Payer: Self-pay | Admitting: *Deleted

## 2019-01-18 ENCOUNTER — Other Ambulatory Visit: Payer: Self-pay

## 2019-01-18 DIAGNOSIS — G40909 Epilepsy, unspecified, not intractable, without status epilepticus: Secondary | ICD-10-CM

## 2019-01-18 DIAGNOSIS — Z79899 Other long term (current) drug therapy: Secondary | ICD-10-CM

## 2019-01-18 DIAGNOSIS — B20 Human immunodeficiency virus [HIV] disease: Secondary | ICD-10-CM

## 2019-01-18 DIAGNOSIS — F1014 Alcohol abuse with alcohol-induced mood disorder: Secondary | ICD-10-CM

## 2019-01-18 DIAGNOSIS — F17200 Nicotine dependence, unspecified, uncomplicated: Secondary | ICD-10-CM

## 2019-01-18 DIAGNOSIS — F1414 Cocaine abuse with cocaine-induced mood disorder: Secondary | ICD-10-CM

## 2019-01-18 DIAGNOSIS — Z113 Encounter for screening for infections with a predominantly sexual mode of transmission: Secondary | ICD-10-CM

## 2019-01-18 MED ORDER — BICTEGRAVIR-EMTRICITAB-TENOFOV 50-200-25 MG PO TABS: 1.0000 | ORAL_TABLET | Freq: Every day | ORAL | 3 refills | Status: DC

## 2019-01-18 MED ORDER — LEVETIRACETAM 750 MG PO TABS: 750.0000 mg | ORAL_TABLET | Freq: Two times a day (BID) | ORAL | 0 refills | Status: DC

## 2019-01-18 NOTE — Progress Notes (Signed)
   Subjective:    Patient ID: Isaac [name], male    DOB: 10/12/1960, 58 y.o.   MRN: [id number]  HPI 58yo M with hx of HIV+, Hep C (1a F0/F1 [11-2014]) treated and completed 03-2016,HTN, seizures and L frontal lobe AVM, and ongoing substance abuse (ETOH, drug addiction).  Has been on atripla-->descovey/tivicay --->genvoya He had gamma knife surgery to his AVM on 04-2017.  Has been in ED twice since last visit for seizures.  Today he would refill of his keppra. Is seeing neurologist.   Wants to change his ART- feels like his appetite is worse,  He denies missed ART. Attributes his increased RNA to smoking marijuana (states he will quit).   HIV 1 RNA Quant (copies/mL)  Date Value  09/26/2018 350 (H)  07/25/2018 132 (H)  05/16/2018 172 (H)   CD4 T Cell Abs (/uL)  Date Value  09/26/2018 430  04/11/2018 380 (L)  10/10/2017 620    Review of Systems  Constitutional: Positive for appetite change. Negative for chills, fever and unexpected weight change.  Respiratory: Negative for cough and shortness of breath.   Gastrointestinal: Negative for constipation and diarrhea.  Genitourinary: Negative for difficulty urinating.  Psychiatric/Behavioral: Negative for sleep disturbance.  denies sick exposures Please see HPI. All other systems reviewed and negative.     Objective:   Physical Exam Phone visit     Assessment & Plan:

## 2019-01-18 NOTE — Assessment & Plan Note (Signed)
Has neuro f/u 6-16 Will refill his keppra til then

## 2019-01-18 NOTE — Assessment & Plan Note (Signed)
Still drinks "a little bit"

## 2019-01-18 NOTE — Assessment & Plan Note (Signed)
States he has been clean.

## 2019-01-18 NOTE — Assessment & Plan Note (Signed)
Still smokes "some". States he willl quit.

## 2019-01-18 NOTE — Assessment & Plan Note (Signed)
Will change to biktarvy to see if this helps his loss of apetite.  His flu, PCV, PSV are all up to date.  Will see him back in 6 months.

## 2019-02-06 ENCOUNTER — Other Ambulatory Visit: Payer: Self-pay | Admitting: Infectious Diseases

## 2019-02-06 DIAGNOSIS — G40909 Epilepsy, unspecified, not intractable, without status epilepticus: Secondary | ICD-10-CM

## 2019-03-01 ENCOUNTER — Other Ambulatory Visit: Payer: Self-pay | Admitting: Infectious Diseases

## 2019-03-01 DIAGNOSIS — B20 Human immunodeficiency virus [HIV] disease: Secondary | ICD-10-CM

## 2019-07-09 ENCOUNTER — Other Ambulatory Visit: Payer: Medicaid Other

## 2019-07-17 ENCOUNTER — Other Ambulatory Visit: Payer: Medicaid Other

## 2019-07-19 ENCOUNTER — Ambulatory Visit: Payer: Medicaid Other

## 2019-07-19 ENCOUNTER — Encounter: Payer: Self-pay | Admitting: Infectious Diseases

## 2019-07-19 ENCOUNTER — Other Ambulatory Visit: Payer: Self-pay

## 2019-07-19 ENCOUNTER — Other Ambulatory Visit: Payer: Self-pay | Admitting: Infectious Diseases

## 2019-07-19 ENCOUNTER — Other Ambulatory Visit (HOSPITAL_COMMUNITY)
Admission: RE | Admit: 2019-07-19 | Discharge: 2019-07-19 | Disposition: A | Discharge status: Home / Self Care | Payer: Medicaid Other | Source: Ambulatory Visit | Attending: Infectious Diseases | Admitting: Infectious Diseases

## 2019-07-19 DIAGNOSIS — B20 Human immunodeficiency virus [HIV] disease: Secondary | ICD-10-CM

## 2019-07-19 DIAGNOSIS — Z113 Encounter for screening for infections with a predominantly sexual mode of transmission: Secondary | ICD-10-CM | POA: Insufficient documentation

## 2019-07-19 DIAGNOSIS — Z79899 Other long term (current) drug therapy: Secondary | ICD-10-CM

## 2019-07-20 LAB — T-HELPER CELL (CD4) - (RCID CLINIC ONLY)
CD4 % Helper T Cell: 27 % — ABNORMAL LOW (ref 33–65)
CD4 T Cell Abs: 574 /uL (ref 400–1790)

## 2019-07-24 ENCOUNTER — Encounter: Payer: Medicaid Other | Admitting: Infectious Diseases

## 2019-07-29 LAB — CBC
HCT: 38.8 % (ref 38.5–50.0)
Hemoglobin: 12.9 g/dL — ABNORMAL LOW (ref 13.2–17.1)
MCH: 28.1 pg (ref 27.0–33.0)
MCHC: 33.2 g/dL (ref 32.0–36.0)
MCV: 84.5 fL (ref 80.0–100.0)
MPV: 8.5 fL (ref 7.5–12.5)
Platelets: 399 10*3/uL (ref 140–400)
RBC: 4.59 10*6/uL (ref 4.20–5.80)
RDW: 11.7 % (ref 11.0–15.0)
WBC: 4.2 10*3/uL (ref 3.8–10.8)

## 2019-07-29 LAB — COMPREHENSIVE METABOLIC PANEL
AG Ratio: 1.5 (calc) (ref 1.0–2.5)
ALT: 19 U/L (ref 9–46)
AST: 25 U/L (ref 10–35)
Albumin: 4.3 g/dL (ref 3.6–5.1)
Alkaline phosphatase (APISO): 69 U/L (ref 35–144)
BUN: 13 mg/dL (ref 7–25)
CO2: 28 mmol/L (ref 20–32)
Calcium: 9.3 mg/dL (ref 8.6–10.3)
Chloride: 103 mmol/L (ref 98–110)
Creat: 1.13 mg/dL (ref 0.70–1.33)
Globulin: 2.8 g/dL (calc) (ref 1.9–3.7)
Glucose, Bld: 86 mg/dL (ref 65–99)
Potassium: 4.4 mmol/L (ref 3.5–5.3)
Sodium: 140 mmol/L (ref 135–146)
Total Bilirubin: 0.4 mg/dL (ref 0.2–1.2)
Total Protein: 7.1 g/dL (ref 6.1–8.1)

## 2019-07-29 LAB — RPR: RPR Ser Ql: NONREACTIVE

## 2019-07-29 LAB — LIPID PANEL
Cholesterol: 163 mg/dL (ref <200)
HDL: 43 mg/dL (ref >=40)
LDL Cholesterol (Calc): 97 mg/dL (calc)
Non-HDL Cholesterol (Calc): 120 mg/dL (calc) (ref <130)
Total CHOL/HDL Ratio: 3.8 (calc) (ref <5.0)
Triglycerides: 135 mg/dL (ref <150)

## 2019-07-29 LAB — HIV-1 RNA QUANT-NO REFLEX-BLD
HIV 1 RNA Quant: <20 copies/mL
HIV-1 RNA Quant, Log: <1.3 Log copies/mL

## 2019-07-30 LAB — MOLECULAR ANCILLARY ONLY
Chlamydia: NEGATIVE
Comment: NEGATIVE
Comment: NORMAL
Neisseria Gonorrhea: NEGATIVE

## 2019-07-31 ENCOUNTER — Encounter: Payer: Medicaid Other | Admitting: Infectious Diseases

## 2019-08-01 ENCOUNTER — Ambulatory Visit: Payer: Medicaid Other

## 2019-08-10 ENCOUNTER — Ambulatory Visit: Payer: Medicaid Other

## 2019-08-16 ENCOUNTER — Ambulatory Visit: Payer: Medicaid Other | Admitting: Infectious Diseases

## 2019-08-21 ENCOUNTER — Ambulatory Visit: Payer: Self-pay

## 2019-08-21 ENCOUNTER — Ambulatory Visit (INDEPENDENT_AMBULATORY_CARE_PROVIDER_SITE_OTHER): Payer: Self-pay | Admitting: Infectious Diseases

## 2019-08-21 ENCOUNTER — Other Ambulatory Visit: Payer: Self-pay

## 2019-08-21 ENCOUNTER — Encounter: Payer: Self-pay | Admitting: Infectious Diseases

## 2019-08-21 VITALS — BP 145/92 | HR 76

## 2019-08-21 DIAGNOSIS — F17200 Nicotine dependence, unspecified, uncomplicated: Secondary | ICD-10-CM

## 2019-08-21 DIAGNOSIS — G40909 Epilepsy, unspecified, not intractable, without status epilepticus: Secondary | ICD-10-CM

## 2019-08-21 DIAGNOSIS — Z113 Encounter for screening for infections with a predominantly sexual mode of transmission: Secondary | ICD-10-CM

## 2019-08-21 DIAGNOSIS — B20 Human immunodeficiency virus [HIV] disease: Secondary | ICD-10-CM

## 2019-08-21 DIAGNOSIS — Z79899 Other long term (current) drug therapy: Secondary | ICD-10-CM

## 2019-08-21 DIAGNOSIS — I10 Essential (primary) hypertension: Secondary | ICD-10-CM

## 2019-08-21 DIAGNOSIS — F1014 Alcohol abuse with alcohol-induced mood disorder: Secondary | ICD-10-CM

## 2019-08-21 MED ORDER — BENAZEPRIL-HYDROCHLOROTHIAZIDE 10-12.5 MG PO TABS: 1.0000 | ORAL_TABLET | Freq: Every day | ORAL | 3 refills | Status: DC

## 2019-08-21 NOTE — Assessment & Plan Note (Signed)
Encouraged to quit. 

## 2019-08-21 NOTE — Assessment & Plan Note (Signed)
He continues on keppra.  Has occas headaches but he attributes to his sz d/o. Takes prn OTC advil.

## 2019-08-21 NOTE — Assessment & Plan Note (Signed)
Slightly up today Not clear he is taking. He has run out.  Will refill.  No CP, no sob.  Has occas headaches but he attributes to his sz d/o.

## 2019-08-21 NOTE — Assessment & Plan Note (Signed)
Has been drinking and occas smoking marijuana.  encouraged him to try to stay abstinent.

## 2019-08-21 NOTE — Assessment & Plan Note (Addendum)
He is doing well Defers flu shot and pcv.  Has condoms rtc in 9 months He and I are encouraged by his CD4 I got food from pantry, met with [facility].  Will enquire about colon at f/u.

## 2019-08-21 NOTE — Progress Notes (Signed)
   Subjective:    Patient ID: Isaac [name], male    DOB: 1961/04/12, 58 y.o.   MRN: [id number]  HPI 58yo M with hx of HIV+, Hep C (1a F0/F1 [11-2014]) treated and completed 03-2016,HTN, seizures and L frontal lobe AVM, and ongoing substance abuse (ETOH, drug addiction).  Has been on atripla-->descovey/tivicay --->genvoya--> biktarvy (01-2019).  He had gamma knife surgery to his AVM on 04-2017.  At his last visit he was changed to biktarvy to try and improve appetite. This did improve but he is now having bone aches.  He was in ED this summer with SI. Resolved.  Stays in a rooming house. Has enough food.  Has male partner, trying to get more viagra.    HIV 1 RNA Quant (copies/mL)  Date Value  07/19/2019 <20 NOT DETECTED  09/26/2018 350 (H)  07/25/2018 132 (H)   CD4 T Cell Abs (/uL)  Date Value  07/19/2019 574  09/26/2018 430  04/11/2018 380 (L)    Review of Systems  Constitutional: Negative for appetite change and unexpected weight change.  Respiratory: Negative for cough and shortness of breath.   Gastrointestinal: Negative for constipation and diarrhea.  Genitourinary: Negative for difficulty urinating.  Musculoskeletal: Positive for arthralgias.  Psychiatric/Behavioral: Negative for self-injury.  Please see HPI. All other systems reviewed and negative. Takes some OTC for his arthritis.      Objective:   Physical Exam Constitutional:      Appearance: Normal appearance.  HENT:     Mouth/Throat:     Mouth: Mucous membranes are moist.     Pharynx: No oropharyngeal exudate.  Eyes:     Extraocular Movements: Extraocular movements intact.     Pupils: Pupils are equal, round, and reactive to light.  Cardiovascular:     Rate and Rhythm: Normal rate and regular rhythm.  Pulmonary:     Effort: Pulmonary effort is normal.     Breath sounds: Normal breath sounds.  Abdominal:     General: Bowel sounds are normal. There is no distension.     Palpations: Abdomen is  soft.     Tenderness: There is no abdominal tenderness.  Musculoskeletal:        General: No swelling.     Cervical back: Normal range of motion and neck supple.  Neurological:     General: No focal deficit present.     Mental Status: He is alert and oriented to person, place, and time.  Psychiatric:        Mood and Affect: Mood normal.           Assessment & Plan:

## 2019-09-27 ENCOUNTER — Encounter: Payer: Self-pay | Admitting: Infectious Diseases

## 2019-11-27 ENCOUNTER — Telehealth: Payer: Self-pay

## 2019-11-27 ENCOUNTER — Other Ambulatory Visit: Payer: Self-pay | Admitting: Infectious Diseases

## 2019-11-27 DIAGNOSIS — B20 Human immunodeficiency virus [HIV] disease: Secondary | ICD-10-CM

## 2019-11-27 MED ORDER — ELVITEG-COBIC-EMTRICIT-TENOFAF 150-150-200-10 MG PO TABS: 1.0000 | ORAL_TABLET | Freq: Every day | ORAL | 3 refills | Status: DC

## 2019-11-27 NOTE — Telephone Encounter (Signed)
Patient would like to stop Biktarvy.  He states symptoms started 90 days ago, he noticed a change in his endurance , having bone and joint pain , diarrhea and nausea daily.  He feels he is deteriorating.  Patient states he was off a few days and symptoms were better.   He would like to restart Genvoya .  Please send to [facility] on [address].   Pt number   [phone]    Please advise.

## 2020-02-21 ENCOUNTER — Telehealth: Payer: Self-pay

## 2020-02-21 NOTE — Telephone Encounter (Signed)
Ok to refill norvasc.  Needs f/u visit within 1 month

## 2020-02-21 NOTE — Telephone Encounter (Signed)
Patient called office today requesting refills on Amlodipine for his BP. States that ED filled prescription last, but has recently ran out. Medication is not listed under active medication. Will forward message to MD to advise if refill is okay.  Verbalized to patient to establish care with a PCP to help manage BP. Patient will contact different office later today to schedule new patient appointment. Patient is using [facility] on [address].  [place]

## 2020-02-22 ENCOUNTER — Other Ambulatory Visit: Payer: Self-pay | Admitting: Infectious Diseases

## 2020-02-22 ENCOUNTER — Other Ambulatory Visit: Payer: Self-pay

## 2020-02-22 DIAGNOSIS — I10 Essential (primary) hypertension: Secondary | ICD-10-CM

## 2020-02-22 DIAGNOSIS — M898X9 Other specified disorders of bone, unspecified site: Secondary | ICD-10-CM

## 2020-02-22 MED ORDER — AMLODIPINE BESYLATE 10 MG PO TABS: 10.0000 mg | ORAL_TABLET | Freq: Every day | ORAL | 1 refills | Status: DC

## 2020-03-13 ENCOUNTER — Other Ambulatory Visit: Payer: Self-pay

## 2020-03-13 ENCOUNTER — Other Ambulatory Visit: Payer: Medicaid Other

## 2020-03-13 ENCOUNTER — Ambulatory Visit: Payer: Medicaid Other

## 2020-03-13 ENCOUNTER — Emergency Department (HOSPITAL_COMMUNITY)
Admission: EM | Admit: 2020-03-13 | Discharge: 2020-03-13 | Disposition: A | Discharge status: Other Acute Inpatient Hospital | Payer: PRIVATE HEALTH INSURANCE | Attending: Emergency Medicine | Admitting: Emergency Medicine

## 2020-03-13 ENCOUNTER — Ambulatory Visit (INDEPENDENT_AMBULATORY_CARE_PROVIDER_SITE_OTHER)
Admission: EM | Admit: 2020-03-13 | Discharge: 2020-03-14 | Disposition: A | Discharge status: Home / Self Care | Payer: PRIVATE HEALTH INSURANCE | Source: Home / Self Care

## 2020-03-13 ENCOUNTER — Encounter (HOSPITAL_COMMUNITY): Payer: Self-pay | Admitting: Emergency Medicine

## 2020-03-13 DIAGNOSIS — F1414 Cocaine abuse with cocaine-induced mood disorder: Secondary | ICD-10-CM | POA: Insufficient documentation

## 2020-03-13 DIAGNOSIS — Z5900 Homelessness unspecified: Secondary | ICD-10-CM

## 2020-03-13 DIAGNOSIS — F1494 Cocaine use, unspecified with cocaine-induced mood disorder: Secondary | ICD-10-CM | POA: Insufficient documentation

## 2020-03-13 DIAGNOSIS — F17210 Nicotine dependence, cigarettes, uncomplicated: Secondary | ICD-10-CM | POA: Diagnosis not present

## 2020-03-13 DIAGNOSIS — M7918 Myalgia, other site: Secondary | ICD-10-CM | POA: Diagnosis present

## 2020-03-13 DIAGNOSIS — R45851 Suicidal ideations: Secondary | ICD-10-CM | POA: Diagnosis not present

## 2020-03-13 DIAGNOSIS — B20 Human immunodeficiency virus [HIV] disease: Secondary | ICD-10-CM | POA: Insufficient documentation

## 2020-03-13 DIAGNOSIS — Z79899 Other long term (current) drug therapy: Secondary | ICD-10-CM | POA: Diagnosis not present

## 2020-03-13 DIAGNOSIS — I10 Essential (primary) hypertension: Secondary | ICD-10-CM | POA: Insufficient documentation

## 2020-03-13 DIAGNOSIS — Z20822 Contact with and (suspected) exposure to covid-19: Secondary | ICD-10-CM | POA: Insufficient documentation

## 2020-03-13 LAB — BASIC METABOLIC PANEL
Anion gap: 9 (ref 5–15)
BUN: 26 mg/dL — ABNORMAL HIGH (ref 6–20)
CO2: 24 mmol/L (ref 22–32)
Calcium: 8.8 mg/dL — ABNORMAL LOW (ref 8.9–10.3)
Chloride: 108 mmol/L (ref 98–111)
Creatinine, Ser: 1.19 mg/dL (ref 0.61–1.24)
GFR calc Af Amer: >60 mL/min (ref >60)
GFR calc non Af Amer: >60 mL/min (ref >60)
Glucose, Bld: 112 mg/dL — ABNORMAL HIGH (ref 70–99)
Potassium: 3.8 mmol/L (ref 3.5–5.1)
Sodium: 141 mmol/L (ref 135–145)

## 2020-03-13 LAB — CBC
HCT: 33.4 % — ABNORMAL LOW (ref 39.0–52.0)
Hemoglobin: 10.6 g/dL — ABNORMAL LOW (ref 13.0–17.0)
MCH: 27.3 pg (ref 26.0–34.0)
MCHC: 31.7 g/dL (ref 30.0–36.0)
MCV: 86.1 fL (ref 80.0–100.0)
Platelets: 292 10*3/uL (ref 150–400)
RBC: 3.88 MIL/uL — ABNORMAL LOW (ref 4.22–5.81)
RDW: 12.5 % (ref 11.5–15.5)
WBC: 7.5 10*3/uL (ref 4.0–10.5)
nRBC: 0 % (ref 0.0–0.2)

## 2020-03-13 LAB — SARS CORONAVIRUS 2 BY RT PCR (HOSPITAL ORDER, PERFORMED IN ~~LOC~~ HOSPITAL LAB): SARS Coronavirus 2: NEGATIVE

## 2020-03-13 MED ORDER — SODIUM CHLORIDE 0.9% FLUSH: 3.0000 mL | Freq: Once | INTRAVENOUS | Status: DC

## 2020-03-13 MED ORDER — LORAZEPAM 2 MG/ML IJ SOLN: 0.0000 mg | Freq: Four times a day (QID) | INTRAMUSCULAR | Status: DC

## 2020-03-13 MED ORDER — THIAMINE HCL 100 MG PO TABS
100.0000 mg | ORAL_TABLET | Freq: Every day | ORAL | Status: DC
  Administered 2020-03-13: 100 mg via ORAL
  Filled 2020-03-13: qty 1

## 2020-03-13 MED ORDER — LORAZEPAM 1 MG PO TABS: 0.0000 mg | ORAL_TABLET | Freq: Four times a day (QID) | ORAL | Status: DC

## 2020-03-13 MED ORDER — THIAMINE HCL 100 MG/ML IJ SOLN: 100.0000 mg | Freq: Every day | INTRAMUSCULAR | Status: DC

## 2020-03-13 MED ORDER — LORAZEPAM 2 MG/ML IJ SOLN: 0.0000 mg | Freq: Two times a day (BID) | INTRAMUSCULAR | Status: DC

## 2020-03-13 MED ORDER — LORAZEPAM 1 MG PO TABS: 0.0000 mg | ORAL_TABLET | Freq: Two times a day (BID) | ORAL | Status: DC
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
## 2020-03-13 NOTE — ED Triage Notes (Signed)
Pt c/o dizziness and light headache due to ETOH abuse, here asking for help, last drink this am.

## 2020-03-13 NOTE — ED Notes (Signed)
Pt admitted voluntary for continuous observation from [facility]. Pt alert and oriented, denies SI/HI, A/VH, and any pain. Pt irritable during admission and stated that he just wants to lay down. Education, support, reassurance, and encouragement provided.Pt given sandwich and soda upon request. Pt denies any concerns at this time, and verbally contracts for safety. Pt ambulating on the unit with no issues. Pt remains safe on the unit.

## 2020-03-13 NOTE — ED Notes (Signed)
Report received from [facility]. Patient resting in bed at this time in no acute distress.

## 2020-03-13 NOTE — ED Notes (Signed)
TTS in progress 

## 2020-03-13 NOTE — ED Notes (Signed)
Pt's belongings inventoried, placed in locker 1.

## 2020-03-13 NOTE — ED Notes (Signed)
Called for room, X1

## 2020-03-13 NOTE — ED Notes (Signed)
Patient's belongings in locker #28.

## 2020-03-14 LAB — POCT URINE DRUG SCREEN - MANUAL ENTRY (I-SCREEN)
POC Amphetamine UR: NOT DETECTED
POC Buprenorphine (BUP): NOT DETECTED
POC Cocaine UR: POSITIVE — AB
POC Marijuana UR: POSITIVE — AB
POC Methadone UR: NOT DETECTED
POC Methamphetamine UR: NOT DETECTED
POC Morphine: NOT DETECTED
POC Oxazepam (BZO): NOT DETECTED
POC Oxycodone UR: NOT DETECTED
POC Secobarbital (BAR): NOT DETECTED

## 2020-03-14 MED ORDER — AMLODIPINE BESYLATE 10 MG PO TABS
10.0000 mg | ORAL_TABLET | Freq: Every day | ORAL | Status: DC
  Administered 2020-03-14: 10 mg via ORAL
  Filled 2020-03-14: qty 1

## 2020-03-14 MED ORDER — LEVETIRACETAM 500 MG PO TABS
750.0000 mg | ORAL_TABLET | Freq: Two times a day (BID) | ORAL | Status: DC
  Administered 2020-03-14: 750 mg via ORAL
  Filled 2020-03-14: qty 1

## 2020-03-14 MED ORDER — LEVETIRACETAM 500 MG PO TABS
1500.0000 mg | ORAL_TABLET | Freq: Two times a day (BID) | ORAL | Status: DC
  Administered 2020-03-14: 1500 mg via ORAL
  Filled 2020-03-14: qty 3

## 2020-03-14 MED ORDER — ACETAMINOPHEN 325 MG PO TABS: 650.0000 mg | ORAL_TABLET | Freq: Four times a day (QID) | ORAL | Status: DC | PRN

## 2020-03-14 MED ORDER — MAGNESIUM HYDROXIDE 400 MG/5ML PO SUSP: 30.0000 mL | Freq: Every day | ORAL | Status: DC | PRN

## 2020-03-14 MED ORDER — HYDROXYZINE HCL 25 MG PO TABS: 25.0000 mg | ORAL_TABLET | Freq: Three times a day (TID) | ORAL | Status: DC | PRN

## 2020-03-14 MED ORDER — ELVITEG-COBIC-EMTRICIT-TENOFAF 150-150-200-10 MG PO TABS
1.0000 | ORAL_TABLET | Freq: Every day | ORAL | Status: DC
  Administered 2020-03-14: 1 via ORAL
  Filled 2020-03-14: qty 1

## 2020-03-14 MED ORDER — BENAZEPRIL-HYDROCHLOROTHIAZIDE 10-12.5 MG PO TABS: 1.0000 | ORAL_TABLET | Freq: Every day | ORAL | Status: DC

## 2020-03-14 MED ORDER — ALUM & MAG HYDROXIDE-SIMETH 200-200-20 MG/5ML PO SUSP: 30.0000 mL | ORAL | Status: DC | PRN

## 2020-03-14 NOTE — ED Notes (Signed)
Patient A&O x 4, ambulatory. Patient discharged in no acute distress. Patient denied SI/HI, A/VH upon discharge. Patient verbalized understanding of all discharge instructions explained by staff, to include follow up appointments and safety plan. Pt belongings returned to patient from locker #28 intact. Patient escorted to lobby via staff for transport to destination via safe transport. Safety maintained.

## 2020-03-14 NOTE — Discharge Instructions (Addendum)
Continue current home medications

## 2020-03-14 NOTE — ED Notes (Signed)
Pt easily aroused when prompted. Denies SI/HI upon wakening. Easily agitated. Pt states, "Physically, I'm fine. Mentally, I'm a wreck". Encouragement provided. On phone at present. Will continue to monitor for safety.
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
## 2020-03-20 ENCOUNTER — Ambulatory Visit: Payer: PRIVATE HEALTH INSURANCE

## 2020-03-20 ENCOUNTER — Other Ambulatory Visit: Payer: Self-pay

## 2020-03-20 ENCOUNTER — Other Ambulatory Visit: Payer: PRIVATE HEALTH INSURANCE

## 2020-03-20 DIAGNOSIS — B20 Human immunodeficiency virus [HIV] disease: Secondary | ICD-10-CM

## 2020-03-20 DIAGNOSIS — Z79899 Other long term (current) drug therapy: Secondary | ICD-10-CM

## 2020-03-20 DIAGNOSIS — Z113 Encounter for screening for infections with a predominantly sexual mode of transmission: Secondary | ICD-10-CM

## 2020-03-21 LAB — T-HELPER CELL (CD4) - (RCID CLINIC ONLY)
CD4 % Helper T Cell: 25 % — ABNORMAL LOW (ref 33–65)
CD4 T Cell Abs: 555 /uL (ref 400–1790)

## 2020-03-22 LAB — LIPID PANEL
Cholesterol: 177 mg/dL (ref <200)
HDL: 41 mg/dL (ref >=40)
LDL Cholesterol (Calc): 110 mg/dL (calc) — ABNORMAL HIGH
Non-HDL Cholesterol (Calc): 136 mg/dL (calc) — ABNORMAL HIGH (ref <130)
Total CHOL/HDL Ratio: 4.3 (calc) (ref <5.0)
Triglycerides: 151 mg/dL — ABNORMAL HIGH (ref <150)

## 2020-03-22 LAB — COMPREHENSIVE METABOLIC PANEL
AG Ratio: 1.5 (calc) (ref 1.0–2.5)
ALT: 15 U/L (ref 9–46)
AST: 18 U/L (ref 10–35)
Albumin: 4.1 g/dL (ref 3.6–5.1)
Alkaline phosphatase (APISO): 74 U/L (ref 35–144)
BUN: 18 mg/dL (ref 7–25)
CO2: 28 mmol/L (ref 20–32)
Calcium: 9.2 mg/dL (ref 8.6–10.3)
Chloride: 103 mmol/L (ref 98–110)
Creat: 0.93 mg/dL (ref 0.70–1.33)
Globulin: 2.8 g/dL (calc) (ref 1.9–3.7)
Glucose, Bld: 88 mg/dL (ref 65–99)
Potassium: 3.7 mmol/L (ref 3.5–5.3)
Sodium: 139 mmol/L (ref 135–146)
Total Bilirubin: 0.3 mg/dL (ref 0.2–1.2)
Total Protein: 6.9 g/dL (ref 6.1–8.1)

## 2020-03-22 LAB — CBC
HCT: 35.9 % — ABNORMAL LOW (ref 38.5–50.0)
Hemoglobin: 11.6 g/dL — ABNORMAL LOW (ref 13.2–17.1)
MCH: 27.9 pg (ref 27.0–33.0)
MCHC: 32.3 g/dL (ref 32.0–36.0)
MCV: 86.3 fL (ref 80.0–100.0)
MPV: 8.4 fL (ref 7.5–12.5)
Platelets: 319 10*3/uL (ref 140–400)
RBC: 4.16 10*6/uL — ABNORMAL LOW (ref 4.20–5.80)
RDW: 12.2 % (ref 11.0–15.0)
WBC: 5.8 10*3/uL (ref 3.8–10.8)

## 2020-03-22 LAB — HIV-1 RNA QUANT-NO REFLEX-BLD
HIV 1 RNA Quant: 800 copies/mL — ABNORMAL HIGH
HIV-1 RNA Quant, Log: 2.9 Log copies/mL — ABNORMAL HIGH

## 2020-03-22 LAB — RPR: RPR Ser Ql: NONREACTIVE

## 2020-03-24 ENCOUNTER — Other Ambulatory Visit: Payer: Self-pay | Admitting: Infectious Diseases

## 2020-03-24 DIAGNOSIS — B20 Human immunodeficiency virus [HIV] disease: Secondary | ICD-10-CM

## 2020-03-25 ENCOUNTER — Other Ambulatory Visit: Payer: Self-pay

## 2020-03-25 DIAGNOSIS — B20 Human immunodeficiency virus [HIV] disease: Secondary | ICD-10-CM

## 2020-03-27 ENCOUNTER — Ambulatory Visit: Payer: Medicaid Other | Admitting: Infectious Diseases

## 2020-04-08 ENCOUNTER — Encounter: Payer: Self-pay | Admitting: Infectious Diseases

## 2020-04-08 ENCOUNTER — Ambulatory Visit (INDEPENDENT_AMBULATORY_CARE_PROVIDER_SITE_OTHER): Payer: PRIVATE HEALTH INSURANCE | Admitting: Infectious Diseases

## 2020-04-08 ENCOUNTER — Other Ambulatory Visit: Payer: Self-pay

## 2020-04-08 VITALS — BP 141/81 | HR 71 | Temp 98.0°F | Wt 166.0 lb

## 2020-04-08 DIAGNOSIS — D229 Melanocytic nevi, unspecified: Secondary | ICD-10-CM

## 2020-04-08 DIAGNOSIS — Z113 Encounter for screening for infections with a predominantly sexual mode of transmission: Secondary | ICD-10-CM | POA: Diagnosis not present

## 2020-04-08 DIAGNOSIS — F1414 Cocaine abuse with cocaine-induced mood disorder: Secondary | ICD-10-CM

## 2020-04-08 DIAGNOSIS — F1014 Alcohol abuse with alcohol-induced mood disorder: Secondary | ICD-10-CM | POA: Diagnosis not present

## 2020-04-08 DIAGNOSIS — B20 Human immunodeficiency virus [HIV] disease: Secondary | ICD-10-CM

## 2020-04-08 DIAGNOSIS — K08409 Partial loss of teeth, unspecified cause, unspecified class: Secondary | ICD-10-CM

## 2020-04-08 DIAGNOSIS — I10 Essential (primary) hypertension: Secondary | ICD-10-CM

## 2020-04-08 DIAGNOSIS — Z79899 Other long term (current) drug therapy: Secondary | ICD-10-CM

## 2020-04-08 DIAGNOSIS — N529 Male erectile dysfunction, unspecified: Secondary | ICD-10-CM

## 2020-04-08 MED ORDER — SILDENAFIL CITRATE 100 MG PO TABS: 50.0000 mg | ORAL_TABLET | Freq: Every day | ORAL | 1 refills | Status: DC | PRN

## 2020-04-08 NOTE — Progress Notes (Signed)
   Subjective:    Patient ID: Isaac [name], male    DOB: 15-Feb-1961, 59 y.o.   MRN: [id number]  HPI 59yo M with hx of HIV+, Hep C (1a F0/F1 [11-2014]) treated and completed 03-2016,HTN, seizures and L frontal lobe AVM, and ongoing substance abuse (ETOH, drug addiction).  Has been on atripla-->descovey/tivicay --->genvoya--> biktarvy (01-2019) --> genvoya (08-2019).  He had gamma knife surgery to his AVM on 04-2017.  Is doing well. Recent ED eval for cocaine use (03-14-2020).   Feels well. No problems with genvoya. His prev bone and joint pain has resolved.  He attributes blip to drinking/drug use.   HIV 1 RNA Quant (copies/mL)  Date Value  03/20/2020 800 (H)  07/19/2019 <20 NOT DETECTED  09/26/2018 350 (H)   CD4 T Cell Abs (/uL)  Date Value  03/20/2020 555  07/19/2019 574  09/26/2018 430    Review of Systems  Constitutional: Negative for appetite change and unexpected weight change.  Respiratory: Negative for cough and shortness of breath.   Cardiovascular: Negative for chest pain.  Gastrointestinal: Negative for constipation and diarrhea.  Genitourinary: Negative for difficulty urinating.  Skin: Positive for rash.       Objective:   Physical Exam Vitals reviewed.  Constitutional:      Appearance: He is normal weight.  HENT:     Mouth/Throat:     Mouth: Mucous membranes are moist.     Dentition: Abnormal dentition. Dental caries present.     Pharynx: No oropharyngeal exudate.  Eyes:     Extraocular Movements: Extraocular movements intact.     Pupils: Pupils are equal, round, and reactive to light.  Cardiovascular:     Rate and Rhythm: Normal rate and regular rhythm.  Pulmonary:     Effort: Pulmonary effort is normal.     Breath sounds: Normal breath sounds.  Abdominal:     General: Bowel sounds are normal. There is no distension.     Palpations: Abdomen is soft.     Tenderness: There is no abdominal tenderness.  Musculoskeletal:        General: Normal  range of motion.     Cervical back: Normal range of motion and neck supple.     Right lower leg: No edema.     Left lower leg: No edema.  Skin:      Neurological:     Mental Status: He is alert.             Assessment & Plan:

## 2020-04-08 NOTE — Assessment & Plan Note (Signed)
Has elevated VL which attributes to his drug/etoh use. I encouraged him about this.  Will recheck his VL and geno today.  He has condoms Will see him back in 6 months if no findings on HIV RNA.  Labs in 6 months.

## 2020-04-08 NOTE — Assessment & Plan Note (Signed)
States he is living healthier lifestyle now. Will watch.

## 2020-04-08 NOTE — Addendum Note (Signed)
Addended by: Zanden Colver C on: 04/08/2020 03:10 PM   Modules accepted: Orders

## 2020-04-08 NOTE — Addendum Note (Signed)
Addended by: Dolan Amen D on: 04/08/2020 03:48 PM   Modules accepted: Orders

## 2020-04-08 NOTE — Assessment & Plan Note (Signed)
Repeat dental f/u pending

## 2020-04-08 NOTE — Assessment & Plan Note (Signed)
Mildly elevated today.  Will have him seen by PCP

## 2020-04-08 NOTE — Assessment & Plan Note (Signed)
Will refill his viagra.  He has condoms

## 2020-04-09 LAB — T-HELPER CELL (CD4) - (RCID CLINIC ONLY)
CD4 % Helper T Cell: 25 % — ABNORMAL LOW (ref 33–65)
CD4 T Cell Abs: 451 /uL (ref 400–1790)

## 2020-04-18 ENCOUNTER — Telehealth: Payer: Self-pay

## 2020-04-18 NOTE — Telephone Encounter (Signed)
Called patient regarding MD's message. Spoke with patient who denies missing any doses of medication. Will come into office on 8/19 to see pharmacy team Aundria Rud, [place]

## 2020-04-18 NOTE — Telephone Encounter (Signed)
Left patient a VM to call the office. Eugenia Mcalpine

## 2020-04-18 NOTE — Telephone Encounter (Signed)
-----   Message from Thayer Headings, MD sent at 04/18/2020 10:29 AM EDT ----- Regarding: failing regimen He has multiple new mutations on Genovya and needs to be seen ASAP by Johnnye Sima or Cassie to change his regimen.

## 2020-04-21 LAB — HIV-1 INTEGRASE GENOTYPE

## 2020-04-21 LAB — HIV-1 GENOTYPE: HIV-1 Genotype: DETECTED — AB

## 2020-04-21 LAB — HIV-1 RNA ULTRAQUANT REFLEX TO GENTYP+
HIV 1 RNA Quant: 832 copies/mL — ABNORMAL HIGH
HIV-1 RNA Quant, Log: 2.92 Log copies/mL — ABNORMAL HIGH

## 2020-04-24 ENCOUNTER — Telehealth: Payer: Self-pay | Admitting: Pharmacist

## 2020-04-24 ENCOUNTER — Other Ambulatory Visit: Payer: Self-pay

## 2020-04-24 ENCOUNTER — Ambulatory Visit (INDEPENDENT_AMBULATORY_CARE_PROVIDER_SITE_OTHER): Payer: PRIVATE HEALTH INSURANCE | Admitting: Pharmacist

## 2020-04-24 DIAGNOSIS — B20 Human immunodeficiency virus [HIV] disease: Secondary | ICD-10-CM

## 2020-04-24 MED ORDER — PREZCOBIX 800-150 MG PO TABS: 1.0000 | ORAL_TABLET | Freq: Every day | ORAL | 6 refills | Status: DC

## 2020-04-24 MED ORDER — JULUCA 50-25 MG PO TABS: 1.0000 | ORAL_TABLET | Freq: Every day | ORAL | 6 refills | Status: DC
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
## 2020-04-24 NOTE — Telephone Encounter (Signed)
Cumulative HIV Genotype Data  Genotype Dates: 04/08/20  RT Mutations  D67G, S68G, K103R, R211K, F214L, K65R, K70R, K219Q, M184V  PI Mutations   Integrase Mutations    Interpretation of Genotype Data per [employer] HIV Drug Resistance Database:  Nucleoside RTIs  Abacavir - high level resistance Zidovudine - intermediate resistance Emtricitabine - high level resistance Lamivudine - high level resistance Tenofovir - high level resistance   Non-Nucleoside RTIs  Doravirine - susceptible Efavirenz - susceptible Etravirine - susceptible Nevirapine - susceptible Rilpivirine - susceptible   Protease Inhibitors  Atazanavir - susceptible Darunavir - susceptible Lopinavir - susceptible   Integrase Inhibitors  Bictegravir - N/A Dolutegravir - N/A Elvitegravir - N/A Raltegravir - N/A

## 2020-04-29 ENCOUNTER — Telehealth: Payer: Self-pay

## 2020-04-29 NOTE — Telephone Encounter (Signed)
Pt informed that his insurance covers only 4 tablets for 30 days for the Viagra. Patient has been informed that he can get a coupon for the medication online or pay out of pocket for 30 tablets for 30 days. Patient states the the 4 tablets will be enough for him. Pharmacy also advised that patient's insurance will only cover 4 tablets for 30 days. Kalli Greenfield T Brooks Sailors

## 2020-04-30 ENCOUNTER — Other Ambulatory Visit: Payer: Self-pay | Admitting: Pharmacist

## 2020-04-30 DIAGNOSIS — N529 Male erectile dysfunction, unspecified: Secondary | ICD-10-CM

## 2020-04-30 MED ORDER — SILDENAFIL CITRATE 25 MG PO TABS: 25.0000 mg | ORAL_TABLET | ORAL | 1 refills | Status: DC

## 2020-04-30 NOTE — Progress Notes (Signed)
Drug interaction between Prezcobix and sildenafil. Max dose is 25 mg every 48 hours. Resending Rx to [facility].

## 2020-05-01 ENCOUNTER — Other Ambulatory Visit: Payer: Medicaid Other

## 2020-05-15 ENCOUNTER — Ambulatory Visit: Payer: Medicaid Other | Admitting: Infectious Diseases

## 2020-05-19 ENCOUNTER — Emergency Department (HOSPITAL_COMMUNITY)
Admission: EM | Admit: 2020-05-19 | Discharge: 2020-05-21 | Disposition: A | Discharge status: Home / Self Care | Payer: PRIVATE HEALTH INSURANCE | Attending: Emergency Medicine | Admitting: Emergency Medicine

## 2020-05-19 ENCOUNTER — Encounter (HOSPITAL_COMMUNITY): Payer: Self-pay | Admitting: Emergency Medicine

## 2020-05-19 ENCOUNTER — Other Ambulatory Visit: Payer: Self-pay

## 2020-05-19 DIAGNOSIS — Z20822 Contact with and (suspected) exposure to covid-19: Secondary | ICD-10-CM | POA: Insufficient documentation

## 2020-05-19 DIAGNOSIS — F329 Major depressive disorder, single episode, unspecified: Secondary | ICD-10-CM | POA: Diagnosis present

## 2020-05-19 DIAGNOSIS — Z79899 Other long term (current) drug therapy: Secondary | ICD-10-CM | POA: Diagnosis not present

## 2020-05-19 DIAGNOSIS — E876 Hypokalemia: Secondary | ICD-10-CM

## 2020-05-19 DIAGNOSIS — I10 Essential (primary) hypertension: Secondary | ICD-10-CM | POA: Diagnosis not present

## 2020-05-19 DIAGNOSIS — F17210 Nicotine dependence, cigarettes, uncomplicated: Secondary | ICD-10-CM | POA: Insufficient documentation

## 2020-05-19 DIAGNOSIS — F1910 Other psychoactive substance abuse, uncomplicated: Secondary | ICD-10-CM | POA: Diagnosis not present

## 2020-05-19 DIAGNOSIS — R45851 Suicidal ideations: Secondary | ICD-10-CM

## 2020-05-19 MED ORDER — LEVETIRACETAM 500 MG PO TABS
1500.0000 mg | ORAL_TABLET | Freq: Once | ORAL | Status: AC
  Administered 2020-05-19: 1500 mg via ORAL
  Filled 2020-05-19: qty 3

## 2020-05-19 NOTE — ED Triage Notes (Signed)
Pt c/o depression, chronic alcohol use, pt reports now having suicidal thoughts. Pt states he is homeless at this time.

## 2020-05-20 ENCOUNTER — Encounter (HOSPITAL_COMMUNITY): Payer: Self-pay | Admitting: Registered Nurse

## 2020-05-20 LAB — COMPREHENSIVE METABOLIC PANEL
ALT: 32 U/L (ref 0–44)
AST: 93 U/L — ABNORMAL HIGH (ref 15–41)
Albumin: 3.9 g/dL (ref 3.5–5.0)
Alkaline Phosphatase: 65 U/L (ref 38–126)
Anion gap: 11 (ref 5–15)
BUN: 21 mg/dL — ABNORMAL HIGH (ref 6–20)
CO2: 23 mmol/L (ref 22–32)
Calcium: 8.8 mg/dL — ABNORMAL LOW (ref 8.9–10.3)
Chloride: 106 mmol/L (ref 98–111)
Creatinine, Ser: 0.99 mg/dL (ref 0.61–1.24)
GFR calc Af Amer: >60 mL/min (ref >60)
GFR calc non Af Amer: >60 mL/min (ref >60)
Glucose, Bld: 86 mg/dL (ref 70–99)
Potassium: 3.1 mmol/L — ABNORMAL LOW (ref 3.5–5.1)
Sodium: 140 mmol/L (ref 135–145)
Total Bilirubin: 0.4 mg/dL (ref 0.3–1.2)
Total Protein: 7.2 g/dL (ref 6.5–8.1)

## 2020-05-20 LAB — URINALYSIS, ROUTINE W REFLEX MICROSCOPIC
Bilirubin Urine: NEGATIVE
Glucose, UA: NEGATIVE mg/dL
Hgb urine dipstick: NEGATIVE
Ketones, ur: NEGATIVE mg/dL
Leukocytes,Ua: NEGATIVE
Nitrite: NEGATIVE
Protein, ur: NEGATIVE mg/dL
Specific Gravity, Urine: 1.015 (ref 1.005–1.030)
pH: 6 (ref 5.0–8.0)

## 2020-05-20 LAB — CBC
HCT: 36.1 % — ABNORMAL LOW (ref 39.0–52.0)
Hemoglobin: 11.1 g/dL — ABNORMAL LOW (ref 13.0–17.0)
MCH: 27.2 pg (ref 26.0–34.0)
MCHC: 30.7 g/dL (ref 30.0–36.0)
MCV: 88.5 fL (ref 80.0–100.0)
Platelets: 340 10*3/uL (ref 150–400)
RBC: 4.08 MIL/uL — ABNORMAL LOW (ref 4.22–5.81)
RDW: 12.4 % (ref 11.5–15.5)
WBC: 5.2 10*3/uL (ref 4.0–10.5)
nRBC: 0 % (ref 0.0–0.2)

## 2020-05-20 LAB — RAPID URINE DRUG SCREEN, HOSP PERFORMED
Amphetamines: NOT DETECTED
Barbiturates: NOT DETECTED
Benzodiazepines: NOT DETECTED
Cocaine: POSITIVE — AB
Opiates: NOT DETECTED
Tetrahydrocannabinol: POSITIVE — AB

## 2020-05-20 LAB — ETHANOL: Alcohol, Ethyl (B): <10 mg/dL (ref <10)

## 2020-05-20 LAB — SARS CORONAVIRUS 2 BY RT PCR (HOSPITAL ORDER, PERFORMED IN ~~LOC~~ HOSPITAL LAB): SARS Coronavirus 2: NEGATIVE

## 2020-05-20 MED ORDER — THIAMINE HCL 100 MG/ML IJ SOLN
100.0000 mg | Freq: Every day | INTRAMUSCULAR | Status: DC
  Filled 2020-05-20: qty 2

## 2020-05-20 MED ORDER — POTASSIUM CHLORIDE CRYS ER 20 MEQ PO TBCR
40.0000 meq | EXTENDED_RELEASE_TABLET | Freq: Once | ORAL | Status: AC
  Administered 2020-05-20: 40 meq via ORAL
  Filled 2020-05-20: qty 2

## 2020-05-20 MED ORDER — LORAZEPAM 1 MG PO TABS: 0.0000 mg | ORAL_TABLET | Freq: Two times a day (BID) | ORAL | Status: DC

## 2020-05-20 MED ORDER — BENAZEPRIL-HYDROCHLOROTHIAZIDE 10-12.5 MG PO TABS: 1.0000 | ORAL_TABLET | Freq: Every day | ORAL | Status: DC

## 2020-05-20 MED ORDER — AMLODIPINE BESYLATE 5 MG PO TABS
10.0000 mg | ORAL_TABLET | Freq: Every day | ORAL | Status: DC
  Administered 2020-05-20: 10 mg via ORAL
  Filled 2020-05-20: qty 2

## 2020-05-20 MED ORDER — LORAZEPAM 1 MG PO TABS: 0.0000 mg | ORAL_TABLET | Freq: Four times a day (QID) | ORAL | Status: DC

## 2020-05-20 MED ORDER — LORAZEPAM 2 MG/ML IJ SOLN: 0.0000 mg | Freq: Four times a day (QID) | INTRAMUSCULAR | Status: DC

## 2020-05-20 MED ORDER — HYDROCHLOROTHIAZIDE 12.5 MG PO CAPS
12.5000 mg | ORAL_CAPSULE | Freq: Every day | ORAL | Status: DC
  Administered 2020-05-20: 12.5 mg via ORAL
  Filled 2020-05-20 (×2): qty 1

## 2020-05-20 MED ORDER — RILPIVIRINE HCL 25 MG PO TABS
25.0000 mg | ORAL_TABLET | Freq: Every day | ORAL | Status: DC
  Administered 2020-05-20: 25 mg via ORAL
  Filled 2020-05-20 (×2): qty 1

## 2020-05-20 MED ORDER — BENAZEPRIL HCL 5 MG PO TABS
10.0000 mg | ORAL_TABLET | Freq: Every day | ORAL | Status: DC
  Administered 2020-05-20: 10 mg via ORAL
  Filled 2020-05-20 (×2): qty 2

## 2020-05-20 MED ORDER — DOLUTEGRAVIR SODIUM 50 MG PO TABS
50.0000 mg | ORAL_TABLET | Freq: Every day | ORAL | Status: DC
  Administered 2020-05-20: 50 mg via ORAL
  Filled 2020-05-20 (×2): qty 1

## 2020-05-20 MED ORDER — THIAMINE HCL 100 MG PO TABS
100.0000 mg | ORAL_TABLET | Freq: Every day | ORAL | Status: DC
  Administered 2020-05-20: 100 mg via ORAL
  Filled 2020-05-20: qty 1

## 2020-05-20 MED ORDER — DOLUTEGRAVIR-RILPIVIRINE 50-25 MG PO TABS: 1.0000 | ORAL_TABLET | Freq: Every day | ORAL | Status: DC

## 2020-05-20 MED ORDER — LORAZEPAM 2 MG/ML IJ SOLN: 0.0000 mg | Freq: Two times a day (BID) | INTRAMUSCULAR | Status: DC

## 2020-05-20 MED ORDER — DARUNAVIR-COBICISTAT 800-150 MG PO TABS
1.0000 | ORAL_TABLET | Freq: Every day | ORAL | Status: DC
  Administered 2020-05-20: 1 via ORAL
  Filled 2020-05-20 (×2): qty 1

## 2020-05-20 MED ORDER — LEVETIRACETAM 750 MG PO TABS
750.0000 mg | ORAL_TABLET | Freq: Two times a day (BID) | ORAL | Status: DC
  Filled 2020-05-20: qty 1
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
## 2020-05-20 NOTE — ED Notes (Signed)
Room reviewed with suicide sitter. Pt in purple scrubs. Lines taped and objects removed from room. Patient's belongings (clothing) in locker 2. Security has wallet (with ID, no money) and cell phone.  Patient comfortable. No signs of distress.   Attending RN to monitor.

## 2020-05-20 NOTE — ED Notes (Signed)
Called Pt for vitals recheck multiple times no answer
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
## 2020-05-20 NOTE — ED Notes (Signed)
Pt belonging placed in locker 2. Wallet and cell phone given to Security by Larence Penning.. RN helping

## 2020-05-21 ENCOUNTER — Encounter (HOSPITAL_COMMUNITY): Payer: Self-pay

## 2020-05-21 ENCOUNTER — Ambulatory Visit (HOSPITAL_COMMUNITY)
Admission: EM | Admit: 2020-05-21 | Discharge: 2020-05-21 | Disposition: A | Discharge status: Home / Self Care | Payer: PRIVATE HEALTH INSURANCE | Attending: Nurse Practitioner | Admitting: Nurse Practitioner

## 2020-05-21 ENCOUNTER — Other Ambulatory Visit: Payer: Self-pay

## 2020-05-21 DIAGNOSIS — B20 Human immunodeficiency virus [HIV] disease: Secondary | ICD-10-CM

## 2020-05-21 DIAGNOSIS — I10 Essential (primary) hypertension: Secondary | ICD-10-CM

## 2020-05-21 DIAGNOSIS — F1994 Other psychoactive substance use, unspecified with psychoactive substance-induced mood disorder: Secondary | ICD-10-CM | POA: Diagnosis not present

## 2020-05-21 LAB — TSH: TSH: 1.922 u[IU]/mL (ref 0.350–4.500)

## 2020-05-21 LAB — ETHANOL: Alcohol, Ethyl (B): <10 mg/dL (ref <10)

## 2020-05-21 MED ORDER — JULUCA 50-25 MG PO TABS: 1.0000 | ORAL_TABLET | Freq: Every day | ORAL | 0 refills | Status: DC

## 2020-05-21 MED ORDER — LEVETIRACETAM 750 MG PO TABS: 750.0000 mg | ORAL_TABLET | Freq: Two times a day (BID) | ORAL | 0 refills | Status: DC

## 2020-05-21 MED ORDER — AMLODIPINE BESYLATE 10 MG PO TABS: 10.0000 mg | ORAL_TABLET | Freq: Every day | ORAL | 0 refills | Status: DC

## 2020-05-21 MED ORDER — ACETAMINOPHEN 325 MG PO TABS: 650.0000 mg | ORAL_TABLET | Freq: Four times a day (QID) | ORAL | Status: DC | PRN

## 2020-05-21 MED ORDER — MAGNESIUM HYDROXIDE 400 MG/5ML PO SUSP: 30.0000 mL | Freq: Every day | ORAL | Status: DC | PRN

## 2020-05-21 MED ORDER — BENAZEPRIL-HYDROCHLOROTHIAZIDE 10-12.5 MG PO TABS: 1.0000 | ORAL_TABLET | Freq: Every day | ORAL | 0 refills | Status: DC

## 2020-05-21 MED ORDER — ALUM & MAG HYDROXIDE-SIMETH 200-200-20 MG/5ML PO SUSP: 30.0000 mL | ORAL | Status: DC | PRN

## 2020-05-21 MED ORDER — ONDANSETRON 4 MG PO TBDP: 4.0000 mg | ORAL_TABLET | Freq: Four times a day (QID) | ORAL | Status: DC | PRN

## 2020-05-21 MED ORDER — DARUNAVIR-COBICISTAT 800-150 MG PO TABS
1.0000 | ORAL_TABLET | Freq: Every day | ORAL | Status: DC
  Administered 2020-05-21: 1 via ORAL
  Filled 2020-05-21: qty 1

## 2020-05-21 MED ORDER — HYDROCHLOROTHIAZIDE 12.5 MG PO CAPS
12.5000 mg | ORAL_CAPSULE | Freq: Every day | ORAL | Status: DC
  Administered 2020-05-21: 12.5 mg via ORAL
  Filled 2020-05-21: qty 1

## 2020-05-21 MED ORDER — HYDROXYZINE HCL 25 MG PO TABS
25.0000 mg | ORAL_TABLET | Freq: Four times a day (QID) | ORAL | Status: DC | PRN
  Administered 2020-05-21: 25 mg via ORAL
  Filled 2020-05-21: qty 1

## 2020-05-21 MED ORDER — LORAZEPAM 1 MG PO TABS: 1.0000 mg | ORAL_TABLET | Freq: Four times a day (QID) | ORAL | Status: DC | PRN

## 2020-05-21 MED ORDER — RILPIVIRINE HCL 25 MG PO TABS
25.0000 mg | ORAL_TABLET | Freq: Every day | ORAL | Status: DC
  Filled 2020-05-21 (×3): qty 1

## 2020-05-21 MED ORDER — HYDROXYZINE HCL 25 MG PO TABS: 25.0000 mg | ORAL_TABLET | Freq: Four times a day (QID) | ORAL | 0 refills | Status: DC | PRN

## 2020-05-21 MED ORDER — LOPERAMIDE HCL 2 MG PO CAPS: 2.0000 mg | ORAL_CAPSULE | ORAL | Status: DC | PRN

## 2020-05-21 MED ORDER — AMLODIPINE BESYLATE 10 MG PO TABS
10.0000 mg | ORAL_TABLET | Freq: Every day | ORAL | Status: DC
  Administered 2020-05-21: 10 mg via ORAL
  Filled 2020-05-21: qty 1

## 2020-05-21 MED ORDER — LEVETIRACETAM 500 MG PO TABS
750.0000 mg | ORAL_TABLET | Freq: Two times a day (BID) | ORAL | Status: DC
  Administered 2020-05-21: 750 mg via ORAL
  Filled 2020-05-21: qty 1

## 2020-05-21 MED ORDER — BENAZEPRIL HCL 10 MG PO TABS
10.0000 mg | ORAL_TABLET | Freq: Every day | ORAL | Status: DC
  Administered 2020-05-21: 10 mg via ORAL
  Filled 2020-05-21: qty 1

## 2020-05-21 MED ORDER — DOLUTEGRAVIR SODIUM 50 MG PO TABS
50.0000 mg | ORAL_TABLET | Freq: Every day | ORAL | Status: DC
  Administered 2020-05-21: 50 mg via ORAL
  Filled 2020-05-21: qty 1

## 2020-05-21 MED ORDER — POTASSIUM CHLORIDE CRYS ER 20 MEQ PO TBCR
40.0000 meq | EXTENDED_RELEASE_TABLET | Freq: Once | ORAL | Status: AC
  Administered 2020-05-21: 40 meq via ORAL
  Filled 2020-05-21: qty 2

## 2020-05-21 MED ORDER — PREZCOBIX 800-150 MG PO TABS: 1.0000 | ORAL_TABLET | Freq: Every day | ORAL | 0 refills | Status: DC

## 2020-05-21 NOTE — ED Notes (Signed)
Patient discharged.

## 2020-05-21 NOTE — ED Provider Notes (Signed)
TTS consultation is appreciated.  Recommended [name].  Covid PCR test is negative.  Mild hypokalemia noted and he is given a dose of oral potassium.  He is felt to be medically cleared at this point.   Delora Fuel, MD 63/84/53 [phone]
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
## 2020-05-21 NOTE — ED Notes (Signed)
Pt asleep with even and unlabored respirations. No distress or discomfort noted. Pt remains safe on the unit. Will continue to monitor. 

## 2020-05-21 NOTE — ED Notes (Signed)
Pt does deny SI/HI/AVH at this time but refused other questions. Offered food and declined. Shown to bed, provided additional blankets for warmth  &l aid down in pullout.

## 2020-05-21 NOTE — ED Notes (Signed)
Locker #[phone]  

## 2020-05-21 NOTE — ED Notes (Signed)
Pt refused labs.  

## 2020-05-21 NOTE — Discharge Instructions (Addendum)
Activity as tolerated. Diet as recommended by primary care physician. Keep all scheduled follow-up appointments as recommended.

## 2020-05-21 NOTE — ED Triage Notes (Signed)
Pt via transport from [facility]. Pt irritable refusing to answer basic questions but compliant with skin assessment.

## 2020-05-21 NOTE — ED Notes (Addendum)
Pt alert and oriented on the unit. Pt requested to discharge at this time. Homeless/Shelter/Food resources provided by LCSW. Education, support, and encouragement provided. Discharge summary, medications and follow up appointments reviewed with pt. Suicide prevention resources provided. Pt's belongings in locker returned and belongings sheet signed. Pt denies SI/HI, A/VH, pain, or any concerns at this time. Pt ambulatory on and off unit. Pt discharged to lobby.

## 2020-05-21 NOTE — ED Provider Notes (Signed)
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 % Final   Platelets 03/13/2020 292  150 - 400 K/uL Final   nRBC 03/13/2020 0.0  0.0 - 0.2 % Final   Performed at [facility] [address] [place], [place]  [address]   SARS Coronavirus 2 03/13/2020 NEGATIVE  NEGATIVE Final   Comment: (NOTE) SARS-CoV-2 target nucleic acids are NOT DETECTED.  The SARS-CoV-2 RNA is generally detectable in upper and lower respiratory specimens during the acute phase of infection. The lowest concentration of SARS-CoV-2 viral copies this assay can detect is 250 copies / mL. A negative result does not preclude SARS-CoV-2 infection and should not be used as the sole basis for treatment or other patient management decisions.  A negative result may occur with improper specimen collection / handling, submission of specimen other than nasopharyngeal swab, presence of viral mutation(s) within the areas targeted by this assay, and inadequate number of viral copies (<250 copies / mL). A negative result must be combined with clinical observations, patient history, and epidemiological information.  Fact Sheet for Patients:   StrictlyIdeas.no  Fact Sheet for Healthcare Providers: BankingDealers.co.za  This test is not yet approved or                           cleared by the [place] FDA and has been authorized for detection and/or diagnosis of SARS-CoV-2 by FDA under an Emergency Use Authorization (EUA).  This EUA will remain in effect (meaning this test can be used) for the duration of the COVID-19 declaration under Section 564(b)(1) of the Act, 21 U.S.C. section 360bbb-3(b)(1), unless the authorization is terminated or revoked sooner.  Performed at [facility], [place] [address], [place], [place] [address]     Allergies: Patient has no known allergies.  Prior to Admission medications   Medication Sig Start Date End Date Taking? Authorizing Provider  amLODipine (NORVASC) 10 MG tablet Take 1 tablet (10 mg total) by mouth daily. 02/22/20   Campbell Riches, MD  benazepril-hydrochlorthiazide (LOTENSIN HCT) 10-12.5 MG tablet Take 1 tablet by mouth daily. For high  blood pressure 08/21/19   Campbell Riches, MD  darunavir-cobicistat (PREZCOBIX) 800-150 MG tablet Take 1 tablet by mouth daily with breakfast. Swallow whole. Do NOT crush, break or chew tablets. Take with food. 04/24/20   Kuppelweiser, Cassie L, RPH-CPP  Dolutegravir-Rilpivirine (JULUCA) 50-25 MG TABS Take 1 tablet by mouth daily with breakfast. 04/24/20   Kuppelweiser, Cassie L, RPH-CPP  hydrOXYzine (ATARAX/VISTARIL) 50 MG tablet Take 1 tablet (50 mg total) by mouth every 6 (six) hours as needed for anxiety (insomnia). (  Sleep) 08/26/17   Lindell Spar I, NP  levETIRAcetam (KEPPRA) 750 MG tablet TAKE 1 TABLET BY MOUTH TWICE DAILY FOR SEIZURE ACTIVITIES Patient taking differently: Take 750 mg by mouth 2 (two) times daily.  02/06/19   Campbell Riches, MD  sildenafil (VIAGRA) 25 MG tablet Take 1 tablet (25 mg total) by mouth as directed. Do not take more than one tablet in a 48 hour period. 04/30/20   Kuppelweiser, Gillian Shields, RPH-CPP     Medical Decision Making  Patient was medically cleared in the emergency department  Continue home medications    Recommendations  Based on my evaluation the patient does not appear to have an emergency medical condition.   Patient will be placed in the continuous assessment area at [facility] for treatment and stabilization. He will be reevaluated on 05/21/2020. The treatment team will determine disposition at that time.      Rozetta Nunnery, NP 05/21/20  5:52 AM

## 2020-05-22 ENCOUNTER — Emergency Department (HOSPITAL_COMMUNITY)
Admission: EM | Admit: 2020-05-22 | Discharge: 2020-05-22 | Disposition: A | Discharge status: Home / Self Care | Payer: PRIVATE HEALTH INSURANCE | Attending: Emergency Medicine | Admitting: Emergency Medicine

## 2020-05-22 ENCOUNTER — Encounter (HOSPITAL_COMMUNITY): Payer: Self-pay | Admitting: *Deleted

## 2020-05-22 DIAGNOSIS — F17210 Nicotine dependence, cigarettes, uncomplicated: Secondary | ICD-10-CM | POA: Insufficient documentation

## 2020-05-22 DIAGNOSIS — Z21 Asymptomatic human immunodeficiency virus [HIV] infection status: Secondary | ICD-10-CM | POA: Diagnosis not present

## 2020-05-22 DIAGNOSIS — Z79899 Other long term (current) drug therapy: Secondary | ICD-10-CM | POA: Diagnosis not present

## 2020-05-22 DIAGNOSIS — I10 Essential (primary) hypertension: Secondary | ICD-10-CM | POA: Insufficient documentation

## 2020-05-22 DIAGNOSIS — R519 Headache, unspecified: Secondary | ICD-10-CM | POA: Insufficient documentation

## 2020-05-22 DIAGNOSIS — Z8669 Personal history of other diseases of the nervous system and sense organs: Secondary | ICD-10-CM | POA: Insufficient documentation

## 2020-05-22 MED ORDER — BUTALBITAL-APAP-CAFFEINE 50-325-40 MG PO TABS
1.0000 | ORAL_TABLET | Freq: Once | ORAL | Status: AC
  Administered 2020-05-22: 1 via ORAL
  Filled 2020-05-22: qty 1
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
## 2020-05-22 NOTE — Discharge Instructions (Signed)
Please follow-up with your family doctor.  Marliss Coots is a provider that is available to help people he do not have a place to live.  Hopefully the social worker can find you a place to stay.

## 2020-05-22 NOTE — ED Triage Notes (Signed)
To ED for eval of HA and states he has etoh and drug dependency. States he was living in a halfway house but has been homeless since Friday due to not being able to pass drug test. Last etoh or drug use last used at 0300 today. Pt is alert and oriented. Cooperative. States he needs detox so he has a place to live. No neuro deficits. Denies HI/SI

## 2020-05-22 NOTE — Progress Notes (Signed)
CSW received consult for patient as he is newly homeless. CSW added homelessness resources to patient's AVS.  Madilyn Fireman, MSW, LCSW-A Transitions of Care  Clinical Social Worker  [facility] Emergency Departments  Medical ICU [phone]

## 2020-05-30 ENCOUNTER — Encounter: Payer: Self-pay | Admitting: Infectious Diseases

## 2020-06-02 ENCOUNTER — Telehealth: Payer: Self-pay

## 2020-06-02 NOTE — Telephone Encounter (Signed)
COVID-19 Pre-Screening Questions:06/02/20  Do you currently have a fever (>100 F), chills or unexplained body aches? NO  Are you currently experiencing new cough, shortness of breath, sore throat, runny nose? NO .  Have you been in contact with someone that is currently pending confirmation of Covid19 testing or has been confirmed to have the [place] virus? NO  **If the patient answers NO to ALL questions -  advise the patient to please call the clinic before coming to the office should any symptoms develop.

## 2020-06-03 ENCOUNTER — Other Ambulatory Visit: Payer: Self-pay

## 2020-06-03 ENCOUNTER — Ambulatory Visit (INDEPENDENT_AMBULATORY_CARE_PROVIDER_SITE_OTHER): Payer: PRIVATE HEALTH INSURANCE | Admitting: Pharmacist

## 2020-06-03 DIAGNOSIS — B20 Human immunodeficiency virus [HIV] disease: Secondary | ICD-10-CM
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
## 2020-06-04 LAB — T-HELPER CELL (CD4) - (RCID CLINIC ONLY)
CD4 % Helper T Cell: 26 % — ABNORMAL LOW (ref 33–65)
CD4 T Cell Abs: 540 /uL (ref 400–1790)

## 2020-06-06 LAB — HIV-1 RNA QUANT-NO REFLEX-BLD
HIV 1 RNA Quant: <20 Copies/mL — ABNORMAL HIGH
HIV-1 RNA Quant, Log: <1.3 Log cps/mL — ABNORMAL HIGH

## 2020-06-24 ENCOUNTER — Ambulatory Visit: Payer: PRIVATE HEALTH INSURANCE | Admitting: Infectious Diseases

## 2020-06-24 ENCOUNTER — Other Ambulatory Visit: Payer: Self-pay

## 2020-06-24 ENCOUNTER — Ambulatory Visit: Payer: PRIVATE HEALTH INSURANCE | Attending: Family Medicine | Admitting: Family Medicine

## 2020-06-24 ENCOUNTER — Encounter: Payer: Self-pay | Admitting: Family Medicine

## 2020-06-24 ENCOUNTER — Telehealth: Payer: Self-pay

## 2020-06-24 ENCOUNTER — Other Ambulatory Visit: Payer: Self-pay | Admitting: Infectious Diseases

## 2020-06-24 VITALS — BP 123/68 | HR 88 | Ht 69.0 in | Wt 167.0 lb

## 2020-06-24 DIAGNOSIS — L723 Sebaceous cyst: Secondary | ICD-10-CM

## 2020-06-24 DIAGNOSIS — F17200 Nicotine dependence, unspecified, uncomplicated: Secondary | ICD-10-CM

## 2020-06-24 DIAGNOSIS — F32A Depression, unspecified: Secondary | ICD-10-CM

## 2020-06-24 DIAGNOSIS — F419 Anxiety disorder, unspecified: Secondary | ICD-10-CM

## 2020-06-24 DIAGNOSIS — G40909 Epilepsy, unspecified, not intractable, without status epilepticus: Secondary | ICD-10-CM

## 2020-06-24 DIAGNOSIS — I10 Essential (primary) hypertension: Secondary | ICD-10-CM

## 2020-06-24 DIAGNOSIS — Z1211 Encounter for screening for malignant neoplasm of colon: Secondary | ICD-10-CM

## 2020-06-24 MED ORDER — LEVETIRACETAM 750 MG PO TABS
750.0000 mg | ORAL_TABLET | Freq: Two times a day (BID) | ORAL | 6 refills | Status: DC
Start: 2020-06-24 — End: 2021-04-03

## 2020-06-24 MED ORDER — HYDROXYZINE HCL 25 MG PO TABS: 25.0000 mg | ORAL_TABLET | Freq: Three times a day (TID) | ORAL | 6 refills | Status: AC | PRN

## 2020-06-24 MED ORDER — BENAZEPRIL-HYDROCHLOROTHIAZIDE 10-12.5 MG PO TABS: 1.0000 | ORAL_TABLET | Freq: Every day | ORAL | 6 refills | Status: DC

## 2020-06-24 MED ORDER — AMLODIPINE BESYLATE 10 MG PO TABS: 10.0000 mg | ORAL_TABLET | Freq: Every day | ORAL | 6 refills | Status: DC

## 2020-06-24 NOTE — Progress Notes (Signed)
Has a black knot on his right leg.

## 2020-06-24 NOTE — Telephone Encounter (Signed)
Done.  He had informed me at his office visit he was good on refills.

## 2020-06-24 NOTE — Telephone Encounter (Signed)
Received message from ID stating that patient needs refills on all maintenance meds.

## 2020-06-24 NOTE — Patient Instructions (Signed)
Epidermal Cyst  An epidermal cyst is a sac made of skin tissue. The sac contains a substance called keratin. Keratin is a protein that is normally secreted through the hair follicles. When keratin becomes trapped in the top layer of skin (epidermis), it can form an epidermal cyst. Epidermal cysts can be found anywhere on your body. These cysts are usually harmless (benign), and they may not cause symptoms unless they become infected. What are the causes? This condition may be caused by:  A blocked hair follicle.  A hair that curls and re-enters the skin instead of growing straight out of the skin (ingrown hair).  A blocked pore.  Irritated skin.  An injury to the skin.  Certain conditions that are passed along from parent to child (inherited).  Human papillomavirus (HPV).  Long-term (chronic) sun damage to the skin. What increases the risk? The following factors may make you more likely to develop an epidermal cyst:  Having acne.  Being overweight.  Being 30-40 years old. What are the signs or symptoms? The only symptom of this condition may be a small, painless lump underneath the skin. When an epidermal cyst ruptures, it may become infected. Symptoms may include:  Redness.  Inflammation.  Tenderness.  Warmth.  Fever.  Keratin draining from the cyst. Keratin is grayish-white, bad-smelling substance.  Pus draining from the cyst. How is this diagnosed? This condition is diagnosed with a physical exam.  In some cases, you may have a sample of tissue (biopsy) taken from your cyst to be examined under a microscope or tested for bacteria.  You may be referred to a health care provider who specializes in skin care (dermatologist). How is this treated? In many cases, epidermal cysts go away on their own without treatment. If a cyst becomes infected, treatment may include:  Opening and draining the cyst, done by a health care provider. After draining, minor surgery to  remove the rest of the cyst may be done.  Antibiotic medicine.  Injections of medicines (steroids) that help to reduce inflammation.  Surgery to remove the cyst. Surgery may be done if the cyst: ? Becomes large. ? Bothers you. ? Has a chance of turning into cancer.  Do not try to open a cyst yourself. Follow these instructions at home:  Take over-the-counter and prescription medicines only as told by your health care provider.  If you were prescribed an antibiotic medicine, take it it as told by your health care provider. Do not stop using the antibiotic even if you start to feel better.  Keep the area around your cyst clean and dry.  Wear loose, dry clothing.  Avoid touching your cyst.  Check your cyst every day for signs of infection. Check for: ? Redness, swelling, or pain. ? Fluid or blood. ? Warmth. ? Pus or a bad smell.  Keep all follow-up visits as told by your health care provider. This is important. How is this prevented?  Wear clean, dry, clothing.  Avoid wearing tight clothing.  Keep your skin clean and dry. Take showers or baths every day. Contact a health care provider if:  Your cyst develops symptoms of infection.  Your condition is not improving or is getting worse.  You develop a cyst that looks different from other cysts you have had.  You have a fever. Get help right away if:  Redness spreads from the cyst into the surrounding area. Summary  An epidermal cyst is a sac made of skin tissue. These cysts are   usually harmless (benign), and they may not cause symptoms unless they become infected.  If a cyst becomes infected, treatment may include surgery to open and drain the cyst, or to remove it. Treatment may also include medicines by mouth or through an injection.  Take over-the-counter and prescription medicines only as told by your health care provider. If you were prescribed an antibiotic medicine, take it as told by your health care  provider. Do not stop using the antibiotic even if you start to feel better.  Contact a health care provider if your condition is not improving or is getting worse.  Keep all follow-up visits as told by your health care provider. This is important. This information is not intended to replace advice given to you by your health care provider. Make sure you discuss any questions you have with your health care provider. Document Revised: 12/14/2018 Document Reviewed: 03/06/2018 Elsevier Patient Education  2020 Elsevier Inc.  
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
## 2020-06-27 ENCOUNTER — Other Ambulatory Visit: Payer: Self-pay | Admitting: Infectious Diseases

## 2020-06-27 DIAGNOSIS — B20 Human immunodeficiency virus [HIV] disease: Secondary | ICD-10-CM

## 2020-07-01 ENCOUNTER — Ambulatory Visit: Payer: Self-pay | Admitting: Surgery

## 2020-07-22 ENCOUNTER — Ambulatory Visit: Payer: Self-pay | Admitting: Surgery

## 2020-08-12 ENCOUNTER — Ambulatory Visit: Payer: Self-pay | Admitting: Surgery

## 2020-09-03 ENCOUNTER — Emergency Department (HOSPITAL_COMMUNITY)
Admission: EM | Admit: 2020-09-03 | Discharge: 2020-09-04 | Disposition: A | Discharge status: Home / Self Care | Payer: PRIVATE HEALTH INSURANCE | Attending: Emergency Medicine | Admitting: Emergency Medicine

## 2020-09-03 DIAGNOSIS — F17210 Nicotine dependence, cigarettes, uncomplicated: Secondary | ICD-10-CM | POA: Diagnosis not present

## 2020-09-03 DIAGNOSIS — Z79899 Other long term (current) drug therapy: Secondary | ICD-10-CM | POA: Insufficient documentation

## 2020-09-03 DIAGNOSIS — F1910 Other psychoactive substance abuse, uncomplicated: Secondary | ICD-10-CM | POA: Diagnosis not present

## 2020-09-03 DIAGNOSIS — I10 Essential (primary) hypertension: Secondary | ICD-10-CM | POA: Insufficient documentation

## 2020-09-03 DIAGNOSIS — R569 Unspecified convulsions: Secondary | ICD-10-CM | POA: Diagnosis present
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
## 2020-09-04 ENCOUNTER — Other Ambulatory Visit: Payer: Self-pay

## 2020-09-04 LAB — COMPREHENSIVE METABOLIC PANEL
ALT: 30 U/L (ref 0–44)
AST: 102 U/L — ABNORMAL HIGH (ref 15–41)
Albumin: 4 g/dL (ref 3.5–5.0)
Alkaline Phosphatase: 70 U/L (ref 38–126)
Anion gap: 13 (ref 5–15)
BUN: 14 mg/dL (ref 6–20)
CO2: 22 mmol/L (ref 22–32)
Calcium: 9.2 mg/dL (ref 8.9–10.3)
Chloride: 97 mmol/L — ABNORMAL LOW (ref 98–111)
Creatinine, Ser: 1.06 mg/dL (ref 0.61–1.24)
GFR, Estimated: >60 mL/min (ref >60)
Glucose, Bld: 86 mg/dL (ref 70–99)
Potassium: 3.3 mmol/L — ABNORMAL LOW (ref 3.5–5.1)
Sodium: 132 mmol/L — ABNORMAL LOW (ref 135–145)
Total Bilirubin: 1 mg/dL (ref 0.3–1.2)
Total Protein: 7.3 g/dL (ref 6.5–8.1)

## 2020-09-04 LAB — CBC
HCT: 40.7 % (ref 39.0–52.0)
Hemoglobin: 12.9 g/dL — ABNORMAL LOW (ref 13.0–17.0)
MCH: 26.9 pg (ref 26.0–34.0)
MCHC: 31.7 g/dL (ref 30.0–36.0)
MCV: 84.8 fL (ref 80.0–100.0)
Platelets: 378 10*3/uL (ref 150–400)
RBC: 4.8 MIL/uL (ref 4.22–5.81)
RDW: 12.1 % (ref 11.5–15.5)
WBC: 5.7 10*3/uL (ref 4.0–10.5)
nRBC: 0 % (ref 0.0–0.2)

## 2020-09-04 LAB — ETHANOL: Alcohol, Ethyl (B): 14 mg/dL — ABNORMAL HIGH (ref <10)

## 2020-09-04 MED ORDER — LEVETIRACETAM IN NACL 1000 MG/100ML IV SOLN
1000.0000 mg | Freq: Once | INTRAVENOUS | Status: AC
  Administered 2020-09-04: 1000 mg via INTRAVENOUS
  Filled 2020-09-04: qty 100

## 2020-09-04 MED ORDER — LORAZEPAM 2 MG/ML IJ SOLN
2.0000 mg | Freq: Once | INTRAMUSCULAR | Status: AC
  Administered 2020-09-04: 2 mg via INTRAVENOUS
  Filled 2020-09-04: qty 1

## 2020-09-04 MED ORDER — CARBAMAZEPINE 200 MG PO TABS: ORAL_TABLET | ORAL | 0 refills | Status: DC
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
## 2020-09-04 NOTE — ED Notes (Signed)
Attempted 2 times for IV access. Unable to obtain access. Notified secondary RN to initiate IV.

## 2020-09-04 NOTE — Discharge Instructions (Signed)
Take the carbamazepine as prescribed to help prevent seizures and alcohol withdrawal.  Contact the residential centers listed for help with your substance abuse problems.

## 2020-09-12 ENCOUNTER — Telehealth: Payer: Self-pay

## 2020-09-12 NOTE — Telephone Encounter (Signed)
Dr.Armbruster- This patient was recently (08/2020) seen in the ED for a seizure.  It is noted in his chart that he has polysubstance abuse as well---  Please advise if patient needs to be scheduled for an OV or if he can proceed with a direct colon at [facility].  Thanks

## 2020-09-12 NOTE — Telephone Encounter (Signed)
Would be best to be seen in person first. Thank you for noticing this.

## 2020-09-15 NOTE — Telephone Encounter (Signed)
Spoke with the patient and explained the dr's recommendations. Made office visit and cancelled PV and colon. Mailed AVS to pt. --pt is aware.

## 2020-09-18 ENCOUNTER — Telehealth: Payer: Self-pay

## 2020-09-18 NOTE — Telephone Encounter (Signed)
Opened in error, no phone call was made

## 2020-09-23 ENCOUNTER — Encounter: Payer: PRIVATE HEALTH INSURANCE | Admitting: Internal Medicine

## 2020-09-24 ENCOUNTER — Ambulatory Visit: Payer: PRIVATE HEALTH INSURANCE | Admitting: Physician Assistant

## 2020-09-25 ENCOUNTER — Ambulatory Visit: Payer: PRIVATE HEALTH INSURANCE | Admitting: Physician Assistant

## 2020-10-03 ENCOUNTER — Ambulatory Visit: Payer: PRIVATE HEALTH INSURANCE | Admitting: Physician Assistant

## 2020-10-07 ENCOUNTER — Encounter: Payer: PRIVATE HEALTH INSURANCE | Admitting: Gastroenterology

## 2020-10-16 ENCOUNTER — Ambulatory Visit: Payer: Self-pay | Admitting: Surgery

## 2020-11-17 ENCOUNTER — Other Ambulatory Visit: Payer: Self-pay | Admitting: Pharmacist

## 2020-11-17 DIAGNOSIS — B20 Human immunodeficiency virus [HIV] disease: Secondary | ICD-10-CM

## 2020-11-20 ENCOUNTER — Other Ambulatory Visit: Payer: PRIVATE HEALTH INSURANCE

## 2020-11-20 ENCOUNTER — Other Ambulatory Visit (HOSPITAL_COMMUNITY)
Admission: RE | Admit: 2020-11-20 | Discharge: 2020-11-20 | Disposition: A | Discharge status: Home / Self Care | Payer: No Typology Code available for payment source | Source: Ambulatory Visit | Attending: Infectious Diseases | Admitting: Infectious Diseases

## 2020-11-20 ENCOUNTER — Other Ambulatory Visit: Payer: Self-pay

## 2020-11-20 DIAGNOSIS — B20 Human immunodeficiency virus [HIV] disease: Secondary | ICD-10-CM

## 2020-11-20 DIAGNOSIS — Z79899 Other long term (current) drug therapy: Secondary | ICD-10-CM

## 2020-11-20 DIAGNOSIS — Z113 Encounter for screening for infections with a predominantly sexual mode of transmission: Secondary | ICD-10-CM | POA: Diagnosis present

## 2020-11-21 LAB — T-HELPER CELL (CD4) - (RCID CLINIC ONLY)
CD4 % Helper T Cell: 26 % — ABNORMAL LOW (ref 33–65)
CD4 T Cell Abs: 512 /uL (ref 400–1790)

## 2020-11-21 LAB — URINE CYTOLOGY ANCILLARY ONLY
Chlamydia: NEGATIVE
Comment: NEGATIVE
Comment: NORMAL
Neisseria Gonorrhea: NEGATIVE

## 2020-11-22 LAB — COMPREHENSIVE METABOLIC PANEL
AG Ratio: 1.5 (calc) (ref 1.0–2.5)
ALT: 16 U/L (ref 9–46)
AST: 29 U/L (ref 10–35)
Albumin: 4.3 g/dL (ref 3.6–5.1)
Alkaline phosphatase (APISO): 69 U/L (ref 35–144)
BUN: 21 mg/dL (ref 7–25)
CO2: 28 mmol/L (ref 20–32)
Calcium: 9.2 mg/dL (ref 8.6–10.3)
Chloride: 104 mmol/L (ref 98–110)
Creat: 0.97 mg/dL (ref 0.70–1.33)
Globulin: 2.8 g/dL (calc) (ref 1.9–3.7)
Glucose, Bld: 77 mg/dL (ref 65–99)
Potassium: 3.9 mmol/L (ref 3.5–5.3)
Sodium: 139 mmol/L (ref 135–146)
Total Bilirubin: 0.3 mg/dL (ref 0.2–1.2)
Total Protein: 7.1 g/dL (ref 6.1–8.1)

## 2020-11-22 LAB — CBC
HCT: 37.2 % — ABNORMAL LOW (ref 38.5–50.0)
Hemoglobin: 12.1 g/dL — ABNORMAL LOW (ref 13.2–17.1)
MCH: 27.2 pg (ref 27.0–33.0)
MCHC: 32.5 g/dL (ref 32.0–36.0)
MCV: 83.6 fL (ref 80.0–100.0)
MPV: 8.2 fL (ref 7.5–12.5)
Platelets: 392 10*3/uL (ref 140–400)
RBC: 4.45 10*6/uL (ref 4.20–5.80)
RDW: 12.1 % (ref 11.0–15.0)
WBC: 4.1 10*3/uL (ref 3.8–10.8)

## 2020-11-22 LAB — HIV-1 RNA QUANT-NO REFLEX-BLD
HIV 1 RNA Quant: NOT DETECTED Copies/mL
HIV-1 RNA Quant, Log: NOT DETECTED Log cps/mL

## 2020-11-22 LAB — LIPID PANEL
Cholesterol: 211 mg/dL — ABNORMAL HIGH (ref <200)
HDL: 38 mg/dL — ABNORMAL LOW (ref >=40)
LDL Cholesterol (Calc): 148 mg/dL (calc) — ABNORMAL HIGH
Non-HDL Cholesterol (Calc): 173 mg/dL (calc) — ABNORMAL HIGH (ref <130)
Total CHOL/HDL Ratio: 5.6 (calc) — ABNORMAL HIGH (ref <5.0)
Triglycerides: 125 mg/dL (ref <150)

## 2020-11-22 LAB — RPR: RPR Ser Ql: NONREACTIVE

## 2020-11-29 ENCOUNTER — Emergency Department (HOSPITAL_COMMUNITY)
Admission: EM | Admit: 2020-11-29 | Discharge: 2020-11-30 | Disposition: A | Discharge status: Home / Self Care | Payer: PRIVATE HEALTH INSURANCE | Attending: Emergency Medicine | Admitting: Emergency Medicine

## 2020-11-29 ENCOUNTER — Encounter (HOSPITAL_COMMUNITY): Payer: Self-pay | Admitting: Emergency Medicine

## 2020-11-29 ENCOUNTER — Emergency Department (HOSPITAL_COMMUNITY): Payer: PRIVATE HEALTH INSURANCE

## 2020-11-29 DIAGNOSIS — F1914 Other psychoactive substance abuse with psychoactive substance-induced mood disorder: Secondary | ICD-10-CM | POA: Insufficient documentation

## 2020-11-29 DIAGNOSIS — F1010 Alcohol abuse, uncomplicated: Secondary | ICD-10-CM | POA: Diagnosis not present

## 2020-11-29 DIAGNOSIS — F17210 Nicotine dependence, cigarettes, uncomplicated: Secondary | ICD-10-CM | POA: Diagnosis not present

## 2020-11-29 DIAGNOSIS — R079 Chest pain, unspecified: Secondary | ICD-10-CM | POA: Diagnosis not present

## 2020-11-29 DIAGNOSIS — F1414 Cocaine abuse with cocaine-induced mood disorder: Secondary | ICD-10-CM | POA: Diagnosis present

## 2020-11-29 DIAGNOSIS — F1910 Other psychoactive substance abuse, uncomplicated: Secondary | ICD-10-CM | POA: Diagnosis present

## 2020-11-29 DIAGNOSIS — R45851 Suicidal ideations: Secondary | ICD-10-CM | POA: Insufficient documentation

## 2020-11-29 DIAGNOSIS — F32A Depression, unspecified: Secondary | ICD-10-CM | POA: Insufficient documentation

## 2020-11-29 DIAGNOSIS — B20 Human immunodeficiency virus [HIV] disease: Secondary | ICD-10-CM | POA: Diagnosis not present

## 2020-11-29 DIAGNOSIS — I10 Essential (primary) hypertension: Secondary | ICD-10-CM | POA: Diagnosis not present

## 2020-11-29 DIAGNOSIS — F1920 Other psychoactive substance dependence, uncomplicated: Secondary | ICD-10-CM | POA: Insufficient documentation

## 2020-11-29 DIAGNOSIS — F1410 Cocaine abuse, uncomplicated: Secondary | ICD-10-CM | POA: Insufficient documentation

## 2020-11-29 DIAGNOSIS — Z79899 Other long term (current) drug therapy: Secondary | ICD-10-CM | POA: Insufficient documentation

## 2020-11-29 LAB — COMPREHENSIVE METABOLIC PANEL
ALT: 27 U/L (ref 0–44)
AST: 37 U/L (ref 15–41)
Albumin: 3.9 g/dL (ref 3.5–5.0)
Alkaline Phosphatase: 70 U/L (ref 38–126)
Anion gap: 10 (ref 5–15)
BUN: 16 mg/dL (ref 6–20)
CO2: 25 mmol/L (ref 22–32)
Calcium: 8.8 mg/dL — ABNORMAL LOW (ref 8.9–10.3)
Chloride: 101 mmol/L (ref 98–111)
Creatinine, Ser: 1 mg/dL (ref 0.61–1.24)
GFR, Estimated: >60 mL/min (ref >60)
Glucose, Bld: 107 mg/dL — ABNORMAL HIGH (ref 70–99)
Potassium: 3.7 mmol/L (ref 3.5–5.1)
Sodium: 136 mmol/L (ref 135–145)
Total Bilirubin: 0.5 mg/dL (ref 0.3–1.2)
Total Protein: 7.1 g/dL (ref 6.5–8.1)

## 2020-11-29 LAB — CBC
HCT: 35.5 % — ABNORMAL LOW (ref 39.0–52.0)
Hemoglobin: 11.4 g/dL — ABNORMAL LOW (ref 13.0–17.0)
MCH: 26.8 pg (ref 26.0–34.0)
MCHC: 32.1 g/dL (ref 30.0–36.0)
MCV: 83.5 fL (ref 80.0–100.0)
Platelets: 351 10*3/uL (ref 150–400)
RBC: 4.25 MIL/uL (ref 4.22–5.81)
RDW: 12.9 % (ref 11.5–15.5)
WBC: 6.4 10*3/uL (ref 4.0–10.5)
nRBC: 0 % (ref 0.0–0.2)

## 2020-11-29 LAB — ACETAMINOPHEN LEVEL: Acetaminophen (Tylenol), Serum: <10 ug/mL — ABNORMAL LOW (ref 10–30)

## 2020-11-29 LAB — RAPID URINE DRUG SCREEN, HOSP PERFORMED
Amphetamines: NOT DETECTED
Barbiturates: NOT DETECTED
Benzodiazepines: NOT DETECTED
Cocaine: POSITIVE — AB
Opiates: NOT DETECTED
Tetrahydrocannabinol: POSITIVE — AB

## 2020-11-29 LAB — TROPONIN I (HIGH SENSITIVITY)
Troponin I (High Sensitivity): 6 ng/L (ref <18)
Troponin I (High Sensitivity): 5 ng/L (ref <18)

## 2020-11-29 LAB — ETHANOL: Alcohol, Ethyl (B): <10 mg/dL (ref <10)

## 2020-11-29 LAB — SALICYLATE LEVEL: Salicylate Lvl: <7 mg/dL — ABNORMAL LOW (ref 7.0–30.0)

## 2020-11-29 MED ORDER — LORAZEPAM 1 MG PO TABS: 0.0000 mg | ORAL_TABLET | Freq: Two times a day (BID) | ORAL | Status: DC

## 2020-11-29 MED ORDER — LORAZEPAM 1 MG PO TABS: 0.0000 mg | ORAL_TABLET | Freq: Four times a day (QID) | ORAL | Status: DC

## 2020-11-29 MED ORDER — LORAZEPAM 2 MG/ML IJ SOLN: 0.0000 mg | Freq: Four times a day (QID) | INTRAMUSCULAR | Status: DC

## 2020-11-29 MED ORDER — ASPIRIN 81 MG PO CHEW
324.0000 mg | CHEWABLE_TABLET | Freq: Once | ORAL | Status: AC
  Administered 2020-11-29: 324 mg via ORAL
  Filled 2020-11-29: qty 4

## 2020-11-29 MED ORDER — THIAMINE HCL 100 MG PO TABS
100.0000 mg | ORAL_TABLET | Freq: Every day | ORAL | Status: DC
  Administered 2020-11-30: 100 mg via ORAL
  Filled 2020-11-29: qty 1

## 2020-11-29 MED ORDER — LORAZEPAM 2 MG/ML IJ SOLN: 0.0000 mg | Freq: Two times a day (BID) | INTRAMUSCULAR | Status: DC

## 2020-11-29 MED ORDER — THIAMINE HCL 100 MG/ML IJ SOLN: 100.0000 mg | Freq: Every day | INTRAMUSCULAR | Status: DC
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
## 2020-11-29 NOTE — ED Notes (Signed)
EKG performed on pt and given to EDP. EKG machine was disconnected from network so pts EKG may not transmit over.

## 2020-11-29 NOTE — BH Assessment (Signed)
TTS assessment:  Requested TTS machine at 6:30pm--pt is in hallway and nurse is trying to find a confidential location for pt during assessment.  Checked again at 7:48pm and patient is still in hallway.  Jeanmarie Plant, MSW, LCSW Outpatient Therapist/Triage Specialist
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
## 2020-11-29 NOTE — ED Notes (Signed)
Adult TTS machine not working. Counselor aware. Will try to obtain peds TTS machine

## 2020-11-29 NOTE — ED Notes (Signed)
Pt in room 37 to complete TTS. Sitter is present with patient.

## 2020-11-29 NOTE — ED Triage Notes (Addendum)
Pt states he has been living in a half-way house for 3 years.  On Thursday he started drinking ETOH and smoking crack.  States he was kicked out and has been walking the streets.  C/o anxiety, depression,and when asked if he is suicidal he states he is "hopeless".  Denies suicidal plan.  Last ETOH 12:30 and last crack 5:30am.  Pt changed into burgundy scrubs.  Belongings bagged and labeled.  Security notified to wand pt.

## 2020-11-30 DIAGNOSIS — F1414 Cocaine abuse with cocaine-induced mood disorder: Secondary | ICD-10-CM

## 2020-11-30 MED ORDER — GABAPENTIN 100 MG PO CAPS
200.0000 mg | ORAL_CAPSULE | Freq: Two times a day (BID) | ORAL | Status: DC
  Administered 2020-11-30: 200 mg via ORAL
  Filled 2020-11-30: qty 2

## 2020-11-30 NOTE — BH Assessment (Addendum)
Per Merlyn Lot, NP, patient is psych cleared. However, needs housing/residental treatment resources and/or return back to [facility]. Disposition Counselor to follow up.   Disposition Counselor contacted the [facility] location after receiving verbal consent from patient to make contact:  [place], [place], [place]. [phone]. Contact person at the facility "[facility]." Contacted the establishment x2 @1300  and 1350, no answer. Left #1 HIPPA compliant voicemail during the first attempt in contacted "T [facility]".  Another attempt was made and this Clinician was able to reach "T Grey", house manger. States that patient is able to return if he pays his rent. Patient owes $400 in rent. However, "[facility]" will accept him back if he pays at least $300. Patient states that he doesn't have the money available to return.   Patient offered alternative follow options such as [facility] ([facility]) and [facility]. Patient declined and reported that he needs to stay local for his local.   Social Work consulted to assist with housing options.   Provider Merlyn Lot, NP) provided updates of all information noted above.

## 2020-11-30 NOTE — ED Notes (Signed)
Pt was given a taxi voucher & all belongings, as well as security items, VSS before AVS papers was given explained/understood.

## 2020-11-30 NOTE — ED Notes (Addendum)
Pt was offered 2 different location to stay since [place] Geneticist, molecular) & this RN was having difficulty getting in touch with "[facility]" on behalf of the pt. "[facility]" called back from [facility] & said pt could come back only if he would pay $300.00 minimum of his $450.00 rent that is due. Pt states that he does not have the money. Marlou Porch, Counselor was informed of pt's reply & SW was contacted by this RN on behalf of pt needing resources since what we have to offer is not working out. Pt states "tell her I want to go for inpatient treatment" & this RN told him that he has been medically cleared. Pt stated " I will just come back."

## 2020-11-30 NOTE — ED Notes (Signed)
Patient resting comfortably at this time.

## 2020-11-30 NOTE — ED Provider Notes (Signed)
Emergency Medicine Observation Re-evaluation Note  LIEUTENANT ABARCA is a 60 y.o. male, seen on rounds today.  Pt initially presented to the ED for complaints of Suicidal and Alcohol Problem Currently, the patient is Resting comfortably.  Physical Exam  BP 113/72 (BP Location: Left Arm)   Pulse 70   Temp 99 F (37.2 C)   Resp 18   SpO2 100%  Physical Exam General: Calm Cardiac: Normal heart rate Lungs: Normal respiratory Psych: Not responding to internal stimuli  ED Course / MDM  EKG:    The patient has been placed in psychiatric observation due to the need to provide a safe environment for the patient while obtaining psychiatric consultation and evaluation, as well as ongoing medical and medication management to treat the patient's condition.  The patient has not been placed under full IVC at this time.   I have reviewed the labs performed to date as well as medications administered while in observation.  Recent changes in the last 24 hours include he has been cleared by psychiatry, who has asked social work to see the patient to assist him with living resources.  He cannot go back to his halfway house because he has been abusing cocaine.  Also apparently there is a financial impediment.  Plan  Current plan is for discharge after appropriate housing can be arranged. Patient is not under full IVC at this time.   Daleen Bo, MD 11/30/20 [phone]

## 2020-11-30 NOTE — [facility] Suicide Risk Assessment (Cosign Needed Addendum)
Suicide Risk Assessment  Discharge Assessment   [facility] Discharge Suicide Risk Assessment   Principal Problem: Cocaine abuse with cocaine-induced mood disorder [facility]) Discharge Diagnoses: Principal Problem:   Cocaine abuse with cocaine-induced mood disorder ([place]) Active Problems:   Polysubstance abuse ([place])   Creta Levin, 60 y.o., male patient presented to [name] for suicidal ideations in the setting of recent drug relapse.  Patient seen via telepsych by this provider; chart reviewed and consulted with Dr. Dwyane Dee on 11/30/20.  On evaluation Isaac [name] reports he is feeling depressed because he recently relapsed on drugs and was put out of his home, "[facility]" due to inability to pay rent.  Reports he had not used drugs for the 2.5 months and recently, began using again. He acknowledges that it was not a good decision to use drugs, he vehemently denies suicidal ideations, plan or intent today, "I just made a bad decision."   He is future oriented and requests resources for a place to stay and verbalizes his intent to refrain from illicit drug use.  He declines peer support at this time.   On admission, his UDS was + for Cocaine and THC; Covid -negative; BAL-negative.  He was started on withdrawal protocol, and given gabapentin to assist with possible withdrawal symptoms.  He has been medication compliant, has not had med side effects and cooperative with medical/nurse team.      Total Time spent with patient: 30 minutes  Musculoskeletal: Cannot fully assess via video visit.   Psychiatric Specialty Exam: @[facility] @  Blood pressure 113/72, pulse 70, temperature 99 F (37.2 C), resp. rate 18, SpO2 100 %.There is no height or weight on file to calculate BMI.  General Appearance: Casual and Fairly Groomed  Engineer, water::  Good  Speech:  Clear and Coherent and Normal Rate  Volume:  Normal  Mood:  Anxious  Affect:  Appropriate and Congruent  Thought Process:  Coherent, Goal  Directed and Descriptions of Associations: Intact  Orientation:  Full (Time, Place, and Person)  Thought Content:  Logical and Rumination  Suicidal Thoughts:  No  Homicidal Thoughts:  No  Memory:  Immediate;   Good Recent;   Good Remote;   Good  Judgement:  Fair, appears baseline in the setting of drug use  Insight:  Fair, appears baseline in the setting of drug use  Psychomotor Activity:  Normal  Concentration:  Good  Recall:  Good  Fund of Knowledge:Fair  Language: Good  Akathisia:  Negative  Handed:  Right  AIMS (if indicated):     Assets:  Communication Skills Resilience  Sleep:   good, >8 hours  Cognition: WNL  ADL's:  Intact   Mental Status Per Nursing Assessment::   On Admission:   See admission nursing notes  Demographic Factors:  Male and Low socioeconomic status  Loss Factors: Financial problems/change in socioeconomic status  Historical Factors: NA  Risk Reduction Factors:   Positive social support  Continued Clinical Symptoms:  Alcohol/Substance Abuse/Dependencies  Cognitive Features That Contribute To Risk:  None    Suicide Risk:  Mild:  Suicidal ideation of limited frequency, intensity, duration, and specificity.  There are no identifiable plans, no associated intent, mild dysphoria and related symptoms, good self-control (both objective and subjective assessment), few other risk factors, and identifiable protective factors, including available and accessible social support.   Plan Of Care/Follow-up recommendations:  Plan- As per above assessment, there are no current grounds for involuntary commitment at this time.?  Patient is not currently interested in inpatient services, but expresses agreement to continue outpatient treatment - we have reviewed importance of substance abuse abstinence, potential negative impact substance abuse can have on his relationships and level of functioning, and importance of medication compliance. ?  Asked [facility] [place],SW to provide housing resources for veteran.   Disposition: No evidence of imminent risk to self or others at present.   Patient does not meet criteria for psychiatric inpatient admission. Supportive therapy provided about ongoing stressors. Discussed crisis plan, support from social network, calling 911, coming to the Emergency Department, and calling Suicide Hotline.   Spoke with Dr. Eulis Foster, [place]; informed of above recommendation and disposition  This service was provided via telemedicine using a 2-way, interactive audio and video technology.  Patient location: [name] Provider location: virtual home office  Names of all persons participating in this telemedicine service and their role in this encounter. Name: Isaac [name]. Cecille Rubin Role: Patient  Name: Merlyn Lot Role: PMHNP   Mallie Darting, NP 11/30/2020, 3:06 PM

## 2020-11-30 NOTE — Discharge Instructions (Signed)
[facility] [place]: [address] [place], [place] [address] [phone]  [facility] is a licensed, innovative, multi-year residential treatment program. [facility] is a cost-free program. You do not need insurance. Each day, [facility] gives more than 400 men and women the tools and support they need to be productive, recovering individuals by providing counseling, mentoring, life skills and vocational training, education, health services, and continuing care. We provide these services and housing, food, clothing, and personal care items to every resident at no charge.  [place], [place] [address] [phone]  [facility] is dedicated to improving the lives of adults living with alcoholism, addiction and/or mental illness by providing them with the resources they need to build strong programs of recovery. Our Services: [facility] to Individuals with Mental Illness

## 2020-11-30 NOTE — Progress Notes (Signed)
   11/30/20 1520  TOC ED Mini Assessment  TOC Time spent with patient (minutes): 15  PING Used in TOC Assessment No  Admission or Readmission Diverted No  Interventions which prevented an admission or readmission Homeless Screening;Transportation Screening  What brought you to the Emergency Department?  SI and etoh relapse  Barriers to Discharge Unsafe home situation  Barrier interventions Homeless and transport screening  Means of departure Taxi  Patient states their goals for this hospitalization and ongoing recovery are: "I just want to go to work tomorrow."  Choice offered to / list presented to  NA   CSW received consult for homeless supports and resources. CSW notes TTS/psychiatry cleared patient (see TTS/psych documentation for further information/disposition) and attempted to link patient to [facility] and [facility] in [place]. CSW spoke with patient and noted he wanted to stay local due to his job and needed 300$ to pay rent. Patient reported he would make a 100$ tomorrow but was worried he would end up in a cycle of homelessness again due to his relapse. CSW attempted to discuss following-up with [facility] 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 Patient expressed concerns he would not have his medications and when he spoke with [facility] he confirmed he could get his medications from there but had no way there. Patient accepted a taxi voucher to be able to get his medication and understood his options for follow-up for supports in the community. For outpatient/psych follow-up please see TTS/psych documentation. Please reach out to CSW for additional [facility] discharge supports.

## 2020-11-30 NOTE — ED Notes (Signed)
SW at bedside to assist with resources

## 2020-12-04 ENCOUNTER — Ambulatory Visit: Payer: Self-pay | Admitting: Surgery

## 2020-12-04 ENCOUNTER — Encounter: Payer: PRIVATE HEALTH INSURANCE | Admitting: Infectious Diseases

## 2020-12-11 ENCOUNTER — Ambulatory Visit: Payer: Self-pay | Admitting: Surgery

## 2020-12-16 ENCOUNTER — Other Ambulatory Visit: Payer: Self-pay | Admitting: Infectious Diseases

## 2020-12-16 DIAGNOSIS — B20 Human immunodeficiency virus [HIV] disease: Secondary | ICD-10-CM

## 2020-12-16 NOTE — Telephone Encounter (Signed)
Patient overdue for follow up appointment, attempted to call patient to schedule, no answer. Left HIPAA compliant voicemail requesting callback.   Beryle Flock, RN

## 2020-12-31 ENCOUNTER — Ambulatory Visit: Payer: PRIVATE HEALTH INSURANCE | Admitting: Physician Assistant

## 2021-01-03 ENCOUNTER — Encounter (HOSPITAL_COMMUNITY): Payer: Self-pay | Admitting: Emergency Medicine

## 2021-01-03 ENCOUNTER — Emergency Department (HOSPITAL_COMMUNITY)
Admission: EM | Admit: 2021-01-03 | Discharge: 2021-01-05 | Disposition: A | Discharge status: Home / Self Care | Payer: No Typology Code available for payment source | Attending: Emergency Medicine | Admitting: Emergency Medicine

## 2021-01-03 ENCOUNTER — Other Ambulatory Visit: Payer: Self-pay

## 2021-01-03 DIAGNOSIS — F1414 Cocaine abuse with cocaine-induced mood disorder: Secondary | ICD-10-CM | POA: Diagnosis present

## 2021-01-03 DIAGNOSIS — F332 Major depressive disorder, recurrent severe without psychotic features: Secondary | ICD-10-CM | POA: Diagnosis not present

## 2021-01-03 DIAGNOSIS — Z21 Asymptomatic human immunodeficiency virus [HIV] infection status: Secondary | ICD-10-CM | POA: Insufficient documentation

## 2021-01-03 DIAGNOSIS — Z20822 Contact with and (suspected) exposure to covid-19: Secondary | ICD-10-CM | POA: Insufficient documentation

## 2021-01-03 DIAGNOSIS — F1920 Other psychoactive substance dependence, uncomplicated: Secondary | ICD-10-CM | POA: Diagnosis present

## 2021-01-03 DIAGNOSIS — F1910 Other psychoactive substance abuse, uncomplicated: Secondary | ICD-10-CM

## 2021-01-03 DIAGNOSIS — F329 Major depressive disorder, single episode, unspecified: Secondary | ICD-10-CM | POA: Diagnosis present

## 2021-01-03 DIAGNOSIS — F17210 Nicotine dependence, cigarettes, uncomplicated: Secondary | ICD-10-CM | POA: Diagnosis not present

## 2021-01-03 DIAGNOSIS — I10 Essential (primary) hypertension: Secondary | ICD-10-CM | POA: Insufficient documentation

## 2021-01-03 DIAGNOSIS — Z79899 Other long term (current) drug therapy: Secondary | ICD-10-CM | POA: Insufficient documentation

## 2021-01-03 DIAGNOSIS — F32A Depression, unspecified: Secondary | ICD-10-CM

## 2021-01-03 LAB — COMPREHENSIVE METABOLIC PANEL
ALT: 41 U/L (ref 0–44)
AST: 52 U/L — ABNORMAL HIGH (ref 15–41)
Albumin: 3.6 g/dL (ref 3.5–5.0)
Alkaline Phosphatase: 66 U/L (ref 38–126)
Anion gap: 7 (ref 5–15)
BUN: 15 mg/dL (ref 6–20)
CO2: 29 mmol/L (ref 22–32)
Calcium: 8.7 mg/dL — ABNORMAL LOW (ref 8.9–10.3)
Chloride: 99 mmol/L (ref 98–111)
Creatinine, Ser: 1.15 mg/dL (ref 0.61–1.24)
GFR, Estimated: >60 mL/min (ref >60)
Glucose, Bld: 92 mg/dL (ref 70–99)
Potassium: 4.2 mmol/L (ref 3.5–5.1)
Sodium: 135 mmol/L (ref 135–145)
Total Bilirubin: 0.6 mg/dL (ref 0.3–1.2)
Total Protein: 7.1 g/dL (ref 6.5–8.1)

## 2021-01-03 LAB — CBC WITH DIFFERENTIAL/PLATELET
Abs Immature Granulocytes: 0.01 10*3/uL (ref 0.00–0.07)
Basophils Absolute: 0 10*3/uL (ref 0.0–0.1)
Basophils Relative: 1 %
Eosinophils Absolute: 0.1 10*3/uL (ref 0.0–0.5)
Eosinophils Relative: 1 %
HCT: 43.6 % (ref 39.0–52.0)
Hemoglobin: 13.2 g/dL (ref 13.0–17.0)
Immature Granulocytes: 0 %
Lymphocytes Relative: 36 %
Lymphs Abs: 1.5 10*3/uL (ref 0.7–4.0)
MCH: 26.9 pg (ref 26.0–34.0)
MCHC: 30.3 g/dL (ref 30.0–36.0)
MCV: 88.8 fL (ref 80.0–100.0)
Monocytes Absolute: 0.6 10*3/uL (ref 0.1–1.0)
Monocytes Relative: 14 %
Neutro Abs: 2 10*3/uL (ref 1.7–7.7)
Neutrophils Relative %: 48 %
Platelets: 375 10*3/uL (ref 150–400)
RBC: 4.91 MIL/uL (ref 4.22–5.81)
RDW: 13.3 % (ref 11.5–15.5)
WBC: 4.2 10*3/uL (ref 4.0–10.5)
nRBC: 0 % (ref 0.0–0.2)

## 2021-01-03 LAB — RESP PANEL BY RT-PCR (FLU A&B, COVID) ARPGX2
Influenza A by PCR: NEGATIVE
Influenza B by PCR: NEGATIVE
SARS Coronavirus 2 by RT PCR: NEGATIVE

## 2021-01-03 LAB — ETHANOL: Alcohol, Ethyl (B): <10 mg/dL (ref <10)

## 2021-01-03 MED ORDER — THIAMINE HCL 100 MG PO TABS
100.0000 mg | ORAL_TABLET | Freq: Every day | ORAL | Status: DC
  Administered 2021-01-03 – 2021-01-05 (×3): 100 mg via ORAL
  Filled 2021-01-03 (×3): qty 1

## 2021-01-03 MED ORDER — LEVETIRACETAM 750 MG PO TABS
750.0000 mg | ORAL_TABLET | Freq: Two times a day (BID) | ORAL | Status: DC
  Administered 2021-01-03 – 2021-01-05 (×4): 750 mg via ORAL
  Filled 2021-01-03 (×7): qty 1

## 2021-01-03 MED ORDER — LORAZEPAM 1 MG PO TABS: 0.0000 mg | ORAL_TABLET | Freq: Two times a day (BID) | ORAL | Status: DC

## 2021-01-03 MED ORDER — DOLUTEGRAVIR-RILPIVIRINE 50-25 MG PO TABS: 1.0000 | ORAL_TABLET | Freq: Every day | ORAL | Status: DC

## 2021-01-03 MED ORDER — RILPIVIRINE HCL 25 MG PO TABS
25.0000 mg | ORAL_TABLET | Freq: Every day | ORAL | Status: DC
  Administered 2021-01-04 – 2021-01-05 (×2): 25 mg via ORAL
  Filled 2021-01-03 (×2): qty 1

## 2021-01-03 MED ORDER — AMLODIPINE BESYLATE 5 MG PO TABS
10.0000 mg | ORAL_TABLET | Freq: Every day | ORAL | Status: DC
Start: 2021-01-03 — End: 2021-01-05
  Administered 2021-01-03 – 2021-01-05 (×3): 10 mg via ORAL
  Filled 2021-01-03 (×3): qty 2

## 2021-01-03 MED ORDER — LORAZEPAM 2 MG/ML IJ SOLN: 0.0000 mg | Freq: Two times a day (BID) | INTRAMUSCULAR | Status: DC

## 2021-01-03 MED ORDER — IBUPROFEN 400 MG PO TABS: 600.0000 mg | ORAL_TABLET | Freq: Three times a day (TID) | ORAL | Status: DC | PRN

## 2021-01-03 MED ORDER — ONDANSETRON HCL 4 MG PO TABS: 4.0000 mg | ORAL_TABLET | Freq: Three times a day (TID) | ORAL | Status: DC | PRN

## 2021-01-03 MED ORDER — LORAZEPAM 1 MG PO TABS
0.0000 mg | ORAL_TABLET | Freq: Four times a day (QID) | ORAL | Status: DC
  Administered 2021-01-03: 1 mg via ORAL
  Filled 2021-01-03: qty 1

## 2021-01-03 MED ORDER — DARUNAVIR-COBICISTAT 800-150 MG PO TABS
1.0000 | ORAL_TABLET | Freq: Every day | ORAL | Status: DC
  Administered 2021-01-04 – 2021-01-05 (×2): 1 via ORAL
  Filled 2021-01-03 (×2): qty 1

## 2021-01-03 MED ORDER — LORAZEPAM 2 MG/ML IJ SOLN: 0.0000 mg | Freq: Four times a day (QID) | INTRAMUSCULAR | Status: DC

## 2021-01-03 MED ORDER — DOLUTEGRAVIR SODIUM 50 MG PO TABS
50.0000 mg | ORAL_TABLET | Freq: Every day | ORAL | Status: DC
  Administered 2021-01-04 – 2021-01-05 (×2): 50 mg via ORAL
  Filled 2021-01-03 (×2): qty 1

## 2021-01-03 MED ORDER — ALUM & MAG HYDROXIDE-SIMETH 200-200-20 MG/5ML PO SUSP: 30.0000 mL | Freq: Four times a day (QID) | ORAL | Status: DC | PRN

## 2021-01-03 MED ORDER — THIAMINE HCL 100 MG/ML IJ SOLN: 100.0000 mg | Freq: Every day | INTRAMUSCULAR | Status: DC

## 2021-01-03 NOTE — ED Notes (Signed)
ED Provider at bedside. 

## 2021-01-03 NOTE — ED Notes (Signed)
Pt speaking with TTS 

## 2021-01-03 NOTE — ED Notes (Signed)
Pt given warm blanket, sandwich bag, and soda

## 2021-01-03 NOTE — ED Triage Notes (Signed)
Pt reports fatigue.  Requesting detox from drugs and alcohol.  Last used both at 2:30am.  Denies SI/HI.  States he is depressed but has no suicidal ideation.
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
## 2021-01-03 NOTE — BH Assessment (Signed)
Merlyn Lot, NP recommends inpt psychiatric tx

## 2021-01-03 NOTE — ED Notes (Signed)
Pt given soda & cup of ice

## 2021-01-04 DIAGNOSIS — F1414 Cocaine abuse with cocaine-induced mood disorder: Secondary | ICD-10-CM

## 2021-01-04 LAB — RAPID URINE DRUG SCREEN, HOSP PERFORMED
Amphetamines: NOT DETECTED
Barbiturates: NOT DETECTED
Benzodiazepines: NOT DETECTED
Cocaine: POSITIVE — AB
Opiates: NOT DETECTED
Tetrahydrocannabinol: NOT DETECTED

## 2021-01-04 MED ORDER — GABAPENTIN 300 MG PO CAPS
300.0000 mg | ORAL_CAPSULE | Freq: Three times a day (TID) | ORAL | Status: DC
  Administered 2021-01-04 (×3): 300 mg via ORAL
  Filled 2021-01-04 (×3): qty 1

## 2021-01-04 MED ORDER — OLANZAPINE 5 MG PO TABS
5.0000 mg | ORAL_TABLET | Freq: Every day | ORAL | Status: DC
Start: 2021-01-04 — End: 2021-01-05
  Administered 2021-01-04: 5 mg via ORAL
  Filled 2021-01-04 (×2): qty 1

## 2021-01-04 NOTE — ED Notes (Signed)
Pt provided with snacks at this time. Pt calm and cooperative with staff.

## 2021-01-04 NOTE — Discharge Instructions (Signed)
[facility] - Admissions are currently completed Monday through Friday at [facility]; both appointments and walk-ins are accepted.  Any individual that is a [facility] resident may present for a substance abuse screening and assessment for admission.  A person may be referred by numerous sources or self-refer.   Potential clients will be screened for medical necessity and appropriateness for the program.  Clients must meet criteria for high-intensity residential treatment services.  If clinically appropriate, a client will continue with the comprehensive clinical assessment and intake process, as well as enrollment in the [facility].  Address: [address] [place], [place] [address] Admin Hours: Mon-Fri 8AM to [place] Hours: 24/7 Phone: [phone] Fax: [phone]  [facility] (Detox) Facility Based Crisis:  These are 3 locations for services: Please call before arrival   Address: [address] [place], [place] [address] Phone: [phone]  Address: [address] [place], [place] [address] Phone#: [phone]  Address: [address] [place], [place] [address] Phone#: [phone]   [facility] ([facility]): (offers outpatient therapy and intensive outpatient substance abuse therapy).  [address] [place], [place] [address] Phone: [phone]  [place]: Offers FREE recovery skills classes, support groups, 1:1 Peer Support, and Compeer Classes. [address] [place], [place] [address] Phone: [phone] (Call to complete intake).  [facility] [address] [place], [place] [address] Phone: [phone] ext: [place] provides food, shelter and other programs and services to the homeless men of [place] through our [employer].  By offering safe shelter, three meals a day, clean clothing, Biblical counseling,  financial planning, vocational training, GED/education and employment assistance, we've helped mend the shattered lives of many homeless men since opening in 1974.  We have approximately 267 beds available, with a max of 312 beds including mats for emergency situations and currently house an average of 270 men a night.  Prospective Client Check-In Information Photo ID Required (State/ Out of State/ [facility]) - if photo ID is not available, clients are required to have a printout of a police/sheriff's criminal history report. Help out with chores around the [place]. No sex offender of any type (pending, charged, registered and/or any other sex related offenses) will be permitted to check in. Must be willing to abide by all rules, regulations, and policies established by the [employer]. The following will be provided - shelter, food, clothing, and biblical counseling. If you or someone you know is in need of assistance at our [facility] shelter in [place], [place], please call [phone] ext. [phone].  [facility]-will provide timely access to mental health services for children and adolescents (4-17) and adults presenting in a mental health crisis. The program is designed for those who need urgent Behavioral Health or Substance Use treatment and are not experiencing a medical crisis that would typically require an emergency room visit.    [place],  [address] Phone: [phone] [email or web address]: Phone#: [phone]  The [facility] SA Intensive Outpatient Program (SAIOP) means structured individual and group addiction activities and services that are provided at an outpatient program designed to assist adult and adolescent consumers to begin recovery and learn skills for recovery maintenance. The [place] program is offered at least 3 hours a day, 3 days a week.SAIOP services shall  include a structured program consisting of,  but not limited to, the following services: Individual counseling and support; Group counseling and support; Family counseling, training or support; Biochemical assays to identify recent drug use (e.g., urine drug screens); Strategies for relapse prevention to include community and social support systems in treatment; Life skills; Crisis contingency planning; Disease Management; and Treatment support activities that have been adapted or specifically designed for persons with physical disabilities, or persons with co-occurring disorders of mental illness and substance abuse/dependence or mental retardation/developmental disability and substance abuse/dependence. Phone: [phone]  Address:   The [facility] will also offer the following outpatient services: (Monday through Friday 8am-5pm)    Partial Hospitalization Program (PHP)  Substance Abuse Intensive Outpatient Program (SA-IOP)  Group Therapy  Medication Management  Peer Living Room We also provide (24/7):  Assessments: Our mental health clinician and providers will conduct a focused mental health evaluation, assessing for immediate safety concerns and further mental health needs. Referral: Our team will provide resources and help connect to community based mental health treatment, when indicated, including psychotherapy, psychiatry, and other specialized behavioral health or substance use disorder services (for those not already in treatment). Transitional Care: Our team providers in person bridging and/or telephonic follow-up during the patient's transition to outpatient services.  [facility] 24-Hour Call Center: [phone] Behavioral Health Crisis Line: [phone]  

## 2021-01-04 NOTE — ED Notes (Signed)
Breakfast Ordered 

## 2021-01-04 NOTE — BHH Suicide Risk Assessment (Cosign Needed)
Suicide Risk [name]  Discharge [name]   Isaac [name]   Principal Problem: Cocaine abuse with cocaine-induced mood disorder [name]) Discharge Diagnoses: Principal Problem:   Cocaine abuse with cocaine-induced mood disorder ([place]) Active Problems:   Major depressive disorder, recurrent severe without psychotic features ([place])   Polysubstance (excluding opioids) dependence, daily use ([place])   Isaac [name], 60 y.o., male patient presented to Isaac [name] with symptoms for depression and requesting drug detox.  Patient seen via telepsych by this provider; chart reviewed and consulted with Dr. Dwyane Dee on 01/04/21.  On evaluation Isaac [name] reports he has been using drugs on and off for the past 20 years.  States he has tried many times to stop using "meth, crack cocaine, weed) but eventually relapses which makes him depressed. Last inpatient substance abuse treatment was 2 years ago, he cannot recall how long he was able to maintain sobriety. He is currently homeless, cannot stay with family due to consequences of drug use, usually stays with friends or sleeps on the street.  Also reports hx for depression but he is not taking antidepressant medications, and cannot state what he took in the past.  He also has HIV and seizure disorder for which he was not taking prescribed medications.  He acknowledges he was feeing down when he arrived at the hospital yesterday but he wants to get help for drug use.  Also is tired of "not having a home."  Today he states he does not want to hurt himself just wants to get treatment for his "addictions".    On admission, his UDS is + for cocaine and THC, negative covid.  He was placed on CIWA protocol and initially given ativan. Due to hx for methamphetamine abuse, this was later changed to gabapentin to assist with withdrawal side effects.  Olanzapine was also ordered for depression.  Since admission, he has been medication compliant, and  denies medication side effects.  He has been cooperative with staff and sleep are appetite, good.  He    Is future oriented and relates his plans for sobriety and hopefully reconnect with family.    HPI Per EDP Notes 01/03/2021: Patient presents with  . Alcohol Problem    Isaac [name] is a 60 y.o. male.  Patient is a 60 year old male with a history of HIV, hypertension, seizures, polysubstance abuse including alcohol abuse who presents with depression and requesting detox.  He says he last drank last night.  Says he feels like he is hopeless and depressed.  He feels like he needs to stop with his drinking and drug use.  He denies any suicidal ideations.  He has been off his medications for about a month.  He does have his medications but has not been taking them for the last month.  He is here requesting detox.  He denies any recent illnesses or current physical complaints.   Total Time spent with patient: 30 minutes  Musculoskeletal: limited [name] via video but patient seen independently ambulating in hallway.  Strength & Muscle Tone: within normal limits Gait & Station: normal Patient leans: N/A  Psychiatric Specialty Exam: @ROSBYAGE @  Blood pressure (!) 152/95, pulse 77, temperature 99.2 F (37.3 C), temperature source Oral, resp. rate 17, SpO2 99 %.There is no height or weight on file to calculate BMI.  General Appearance: Casual  Eye Contact::  Good  Speech:  Clear and Coherent and Normal Rate  Volume:  Normal  Mood:  Anxious  Affect:  Congruent  Thought Process:  Coherent, Goal Directed and Descriptions of Associations: Intact  Orientation:  Full (Time, Place, and Person)  Thought Content:  Logical and improved since admission  Suicidal Thoughts:  No  Homicidal Thoughts:  No  Memory:  Immediate;   Good Recent;   Good Remote;   Good  Judgement:  Fair, in the setting of chronic drug use  Insight:  Lacking, in the setting of chronic drug use  Psychomotor Activity:   Increased  Concentration:  Fair  Recall:  Good  Fund of Knowledge:Good  Language: Good  Akathisia:  NA  Handed:  Right  AIMS (if indicated):     Assets:  Communication Skills Desire for Improvement  Sleep:   <6 hours  Cognition: WNL  ADL's:  Intact   Mental Status Per Nursing [name]::   On Admission:   Per Triage nurse notes dates 01/03/2021@0943  Pt reports fatigue.  Requesting detox from drugs and alcohol.  Last used both at 2:30am.  Denies SI/HI.  States he is depressed but has no suicidal ideation.  Demographic Factors:  Male and Low socioeconomic status  Loss Factors: Financial problems/change in socioeconomic status  Historical Factors: Impulsivity  Risk Reduction Factors:   Sense of responsibility to family  Continued Clinical Symptoms:  Alcohol/Substance Abuse/Dependencies  Cognitive Features That Contribute To Risk:  None    Suicide Risk:  Mild:  Suicidal ideation of limited frequency, intensity, duration, and specificity.  There are no identifiable plans, no associated intent, mild dysphoria and related symptoms, good self-control (both objective and subjective [name]), few other risk factors, and identifiable protective factors, including available and accessible social support.   Plan Of Care/Follow-up recommendations:  Plan- As per above [name], there are no current grounds for involuntary commitment at this time.?  Patient is not currently interested in inpatient services, but expresses agreement to continue treatment with inpatient SUD facility- we have reviewed importance of substance abuse abstinence, potential negative impact substance abuse can have on his relationships and level of functioning, and importance of medication compliance. ?  Spoke with Isaac [name], dispositions SW who will coordinate transfer to Isaac [name] or other inpatient facility for SUD treatment prior to discharge.  Gwenith Daily, RN, Dene Gentry, EDP also aware of above  status via messenger and receipt verified.    Patient location: Isaac [name] Provider Location: [facility] department - patient seen via video visit  Mallie Darting, NP 01/04/2021, 10:14 AM

## 2021-01-04 NOTE — Progress Notes (Signed)
CSW attempted to contacted substance abuse treatment facility to coordinate the patient being linked to detox treatment. [facility] decline due to insurance status, [facility] in [place] currently have no beds available. Further, [facility] in [place] reports they have beds available, however the patient would have to provide his own transportation to and from the facility. CSW was advised that the patient would have to go through a screening process and there would be no sure answer if he would be accepted. CSW provided the patient with additional resources for detox and substance abuse treatment that are listed below:     [place] are currently completed Monday through Friday at [facility]; both appointments and walk-ins are accepted.  Any individual that is a [facility] resident may present for a substance abuse screening and assessment for admission.  A person may be referred by numerous sources or self-refer.   Potential clients will be screened for medical necessity and appropriateness for the program.  Clients must meet criteria for high-intensity residential treatment services.  If clinically appropriate, a client will continue with the comprehensive clinical assessment and intake process, as well as enrollment in the [place].  Address: [address] [place], [place] [address] Admin Hours: Mon-Fri 8AM to [place] Hours: 24/7 Phone: [phone] Fax: [phone]  [facility]) Facility Based Crisis:  These are 3 locations for services: Please call before arrival   Address: [address] [place], [place] [address] Phone: [phone]  Address: [address] [place], [place] [address] Phone#: [phone]  Address: [address] [place], [place] [address] Phone#: [phone]   [facility] (ADS): (offers outpatient therapy and intensive  outpatient substance abuse therapy).  [address], [place], [place] [address] Phone: [phone]  [place]: Offers FREE recovery skills classes, support groups, 1:1 Peer Support, and Compeer Classes. [address], [place],  [address] Phone: [phone] (Call to complete intake).  [facility] [address] [place],  [address] Phone: [phone] ext: [place] provides food, shelter and other programs and services to the homeless men of [place] through our [employer].  By offering safe shelter, three meals a day, clean clothing, Biblical counseling, financial planning, vocational training, GED/education and employment assistance, we've helped mend the shattered lives of many homeless men since opening in 1974.  We have approximately 267 beds available, with a max of 312 beds including mats for emergency situations and currently house an average of 270 men a night.  Prospective Client Check-In Information Photo ID Required (State/ Out of State/ [facility]) - if photo ID is not available, clients are required to have a printout of a police/sheriff's criminal history report. Help out with chores around the [place]. No sex offender of any type (pending, charged, registered and/or any other sex related offenses) will be permitted to check in. Must be willing to abide by all rules, regulations, and policies established by the [employer]. The following will be provided - shelter, food, clothing, and biblical counseling. If you or someone you know is in need of assistance at our [facility] shelter in [place], [place], please call [phone] ext. [phone].  [facility]-will provide timely access to mental health services for children and adolescents (4-17) and adults presenting in a mental health crisis. The program is designed for those who need urgent  Behavioral Health  or Substance Use treatment and are not experiencing a medical crisis that would typically require an emergency room visit.    [place], [place] [address] Phone: [phone] [facility]: Phone#: [phone]  The [facility] SA Intensive Outpatient Program (SAIOP) means structured individual and group addiction activities and services that are provided at an outpatient program designed to assist adult and adolescent consumers to begin recovery and learn skills for recovery maintenance. The [place] program is offered at least 3 hours a day, 3 days a week.SAIOP services shall include a structured program consisting of, but not limited to, the following services: Individual counseling and support; Group counseling and support; Family counseling, training or support; Biochemical assays to identify recent drug use (e.g., urine drug screens); Strategies for relapse prevention to include community and social support systems in treatment; Life skills; Crisis contingency planning; Disease Management; and Treatment support activities that have been adapted or specifically designed for persons with physical disabilities, or persons with co-occurring disorders of mental illness and substance abuse/dependence or mental retardation/developmental disability and substance abuse/dependence. Phone: [phone]  Address:   [facility] will also offer the following outpatient services: (Monday through Friday 8am-5pm)    Partial Hospitalization Program (PHP)  Substance Abuse Intensive Outpatient Program (SA-IOP)  Group Therapy  Medication Management  Peer Living Room We also provide (24/7):  Assessments: Our mental health clinician and providers will conduct a focused mental health evaluation, assessing for immediate safety concerns and further mental health needs. Referral: Our team will provide resources and  help connect to community based mental health treatment, when indicated, including psychotherapy, psychiatry, and other specialized behavioral health or substance use disorder services (for those not already in treatment). Transitional Care: Our team providers in person bridging and/or telephonic follow-up during the patient's transition to outpatient services.  The [place]: [phone] Behavioral Health Crisis Line: [phone]   [place]ie [place], MSW, [place],  Phone: [phone] Disposition/TOC

## 2021-01-05 ENCOUNTER — Encounter (HOSPITAL_COMMUNITY): Payer: Self-pay | Admitting: Registered Nurse

## 2021-01-05 NOTE — ED Notes (Signed)
Pt refuses vital and states "I don't need that paperwork, I'm not going there"

## 2021-01-05 NOTE — ED Notes (Addendum)
Pt refused vital signs. Pt now refuses a bus pass. States he will give the social worker his connections phone number for a cab.

## 2021-01-05 NOTE — ED Notes (Signed)
SW, then d/c Breakfast order placed

## 2021-01-05 NOTE — Progress Notes (Signed)
CSW spoke with patient about inpatient and outpatient drug treatment facilities. Patient stated that outpatient was not going to work. CSW stated she wanted him to have these resources in case he cannot get a bed. CSW went over the inpatient facilities and gave numbers for patient to call to see if they had any beds available. Patient stated he was previously living at a halfway house and on the streets. CSW asked patient if he wanted shelter resources and he stated no. Patient stated that he needs to speak with Psych again because he is getting worse, in distress, has hopelessness, and feels like giving up. Patient stated he had no family or friends but stated he is from [facility] and there is no one there is either. Patient was not eager to call inpatient facilities. CS[facility] even [facility] [place] [facility] that still may be there. CSW also stated he will have to go through a screening process to see if he will be accepted.    CSW updated disposition that patient needed to speak with psych again.

## 2021-01-05 NOTE — ED Notes (Signed)
Pt going to take a shower, then being discharged.

## 2021-01-05 NOTE — Consult Note (Signed)
Creta Levin, 60 y.o., male patient presented to [name] with symptoms for depression and requesting drug detox.  Patient was psychiatrically cleared yesterday but upon discharged made complaints of still not feeling stable and worsening stress.       Patient seen face to face by this provider today for reassessment, consulted with Dr. Astrid Divine; and chart reviewed on 01/05/21.  On evaluation KELLAR WESTBERG reporting he wants to go to a Rehab facility.  Discussed different options with patient and informed that he would have to make the phone call related to phone intake interviews would need to be done.  Also discussed options available and patient not wanting to go to [facility].  Patient instructed that he could go to the [facility] where phones, and a Education officer, museum are available to assist with finding a half way house or rehab facility.  While in ED patient has refused to participate in using the phone to make phone calls. There also appears to be some malingering for secondary gain related to patients homelessness.   During evaluation PEYTON SPENGLER is laying in bed; had to call patients name several times to get him to sit up and participate in assessment.  Patient was in no acute distress.  He is alert, oriented x 4, calm and cooperative.  His mood is nonchalant but euthymic with congruent affect.  He does not appear to be responding to internal/external stimuli or delusional thoughts.  Patient denies suicidal/self-harm/homicidal ideation, psychosis, and paranoia.  Patient answered question appropriately.    Spoke with Education officer, museum who gave patient more resources and attempted to get patient to make more phone calls to resources for rehab and halfway houses but patient refused.  Patient doesn't want to put in the effort to find services.    Disposition:  Patient continues to be psychiatrically cleared No evidence of imminent risk to self or others at present.   Patient does not meet criteria  for psychiatric inpatient admission. Supportive therapy provided about ongoing stressors. Refer to IOP. Discussed crisis plan, support from social network, calling 911, coming to the Emergency Department, and calling Suicide Hotline.   Patient was also given the following resources along with other hand outs for shelter/housing, rehab/detox facilities, and outpatient psychiatric services.    Substance Abuse Resources  [facility] - Admissions are currently completed Monday through Friday at [facility]; both appointments and walk-ins are accepted.  Any individual that is a [facility] resident may present for a substance abuse screening and assessment for admission.  A person may be referred by numerous sources or self-refer.   Potential clients will be screened for medical necessity and appropriateness for the program.  Clients must meet criteria for high-intensity residential treatment services.  If clinically appropriate, a client will continue with the comprehensive clinical assessment and intake process, as well as enrollment in the [facility].   Address: [address] [place], [place] [address] Admin Hours: Mon-Fri 8AM to [place] Hours: 24/7 Phone: [phone] Fax: [phone]   [facility] (Detox) Facility Based Crisis:  These are 3 locations for services: Please call before arrival    Address: [address] [place], [place] [address] Phone: [phone]   Address: [address] [place], [place] [address] Phone#: [phone]   Address: [address] [place],  [address] Phone#: [phone]     [facility] ([facility]): (offers outpatient therapy and intensive outpatient substance abuse  therapy).  [address], [place], [place] [address] Phone: [phone]   [facility]: Offers FREE recovery skills classes, support groups, 1:1 Peer Support, and Compeer Classes. [address] [place], [place] [address] Phone: [phone] (Call to complete intake).  [facility] [address] [place], [place] [address] Phone: [phone] ext: [facility] provides food, shelter and other programs and services to the homeless men of [place] through our [employer].   By offering safe shelter, three meals a day, clean clothing, Biblical counseling, financial planning, vocational training, GED/education and employment assistance, we've helped mend the shattered lives of many homeless men since opening in 1974.   We have approximately 267 beds available, with a max of 312 beds including mats for emergency situations and currently house an average of 270 men a night.   Prospective Client Check-In Information Photo ID Required (State/ Out of State/ [facility]) - if photo ID is not available, clients are required to have a printout of a police/sheriff's criminal history report. Help out with chores around the [place]. No sex offender of any type (pending, charged, registered and/or any other sex related offenses) will be permitted to check in. Must be willing to abide by all rules, regulations, and policies established by the [employer]. The following will be provided - shelter, food, clothing, and biblical counseling. If you or someone you know is in need of assistance at our [facility] shelter in [place], [place], please call [phone] ext. [phone].   [facility]-will provide timely access to mental health services for children and adolescents (4-17) and adults presenting in a mental health crisis. The program is designed for those who need urgent Behavioral Health or Substance Use treatment and are not experiencing a medical crisis that would typically require an emergency room visit.    [place], [place] [address] Phone: [phone] [facility]: Phone#: [phone]   The [facility] SA Intensive Outpatient Program (SAIOP) means structured individual and group addiction activities and services that are provided at an outpatient program designed to assist adult and adolescent consumers to begin recovery and learn skills for recovery maintenance. The [facility] program is offered at least 3 hours a day, 3 days a week.SAIOP services shall include a structured program consisting of, but not limited to, the following services: Individual counseling and support; Group counseling and support; Family counseling, training or support; Biochemical assays to identify recent drug use (e.g., urine drug screens); Strategies for relapse prevention to include community and social support systems in treatment; Life skills; Crisis contingency planning; Disease Management; and Treatment support activities that have been adapted or specifically designed for persons with physical disabilities, or persons with co-occurring disorders of mental illness and substance abuse/dependence or mental retardation/developmental disability and substance abuse/dependence. Phone: [phone]  Address:    The [facility] will also offer the following outpatient services: (Monday through Friday 8am-5pm)    Partial Hospitalization Program (PHP)  Substance Abuse Intensive Outpatient Program (SA-IOP)  Group Therapy  Medication Management  Peer Living Room We also provide (24/7):  Assessments: Our mental health clinician and providers will conduct a focused mental health evaluation, assessing for immediate safety concerns and further mental health needs. Referral: Our team will provide resources and help connect to community based mental health treatment, when indicated, including psychotherapy, psychiatry, and other specialized behavioral health or substance use disorder  services (for those not already in  treatment). Transitional Care: Our team providers in person bridging and/or telephonic follow-up during the patient's transition to outpatient services.   [facility]: [phone]  Behavioral Health Crisis Line: [phone]    Seyon Strader B. Nesbit Michon, NP

## 2021-01-05 NOTE — ED Notes (Signed)
Pt appears to be sleeping on right side. Chest with equal rise and fall.

## 2021-01-05 NOTE — Progress Notes (Signed)
Patient was able to talk with psych who cleared him. Patient still had not made any phone calls to the inpatient facilities and stated that Psych was going to do something different for him. Psych came into the room again and gave a phone number for [facility]. Patient then stated he wanted to go to [address] in [place]. CSW asked who lived there and patient stated a distant relative. CSW stated earlier he said he had no family. Patient stated its distant. Patient gave the name of Johnnye Sima, but could not give a number. Patient stated it would not be a good idea to call her and stated he would just take a bus pass then a taxi there. Bus pass provided to nursing

## 2021-01-05 NOTE — Progress Notes (Signed)
CSW called and spoke to [facility] covering ED.  CSW informed that per the morning the meeting CSWE needs to assist pt with substance abuse/detox facility needs.  Assunta Curtis, MSW, LCSW 01/05/2021 9:37 AM

## 2021-02-17 ENCOUNTER — Other Ambulatory Visit: Payer: Self-pay | Admitting: Family Medicine

## 2021-02-17 ENCOUNTER — Other Ambulatory Visit: Payer: Self-pay | Admitting: Infectious Diseases

## 2021-02-17 DIAGNOSIS — B20 Human immunodeficiency virus [HIV] disease: Secondary | ICD-10-CM

## 2021-02-17 NOTE — Telephone Encounter (Signed)
Requested medication (s) are due for refill today: yes   Requested medication (s) are on the active medication list: yes   Last refill: 01/18/2021  Future visit scheduled: no  Notes to clinic:  this refill cannot be delegated    Requested Prescriptions  Pending Prescriptions Disp Refills   levETIRAcetam (KEPPRA) 750 MG tablet [Pharmacy Med Name: LEVETIRACETAM 750MG  TABLETS] 60 tablet 6    Sig: TAKE 1 TABLET(750 MG) BY MOUTH TWICE DAILY      Not Delegated - Neurology:  Anticonvulsants Failed - 02/17/2021  9:07 AM      Failed - This refill cannot be delegated      Passed - HCT in normal range and within 360 days    HCT  Date Value Ref Range Status  01/03/2021 43.6 39.0 - 52.0 % Final          Passed - HGB in normal range and within 360 days    Hemoglobin  Date Value Ref Range Status  01/03/2021 13.2 13.0 - 17.0 g/dL Final          Passed - PLT in normal range and within 360 days    Platelets  Date Value Ref Range Status  01/03/2021 375 150 - 400 K/uL Final    Comment:    REPEATED TO VERIFY          Passed - WBC in normal range and within 360 days    WBC  Date Value Ref Range Status  01/03/2021 4.2 4.0 - 10.5 K/uL Final          Passed - Valid encounter within last 12 months    Recent Outpatient Visits           7 months ago Sebaceous cyst   [facility] Charlott Rakes, MD

## 2021-02-27 ENCOUNTER — Other Ambulatory Visit (HOSPITAL_COMMUNITY)
Admission: RE | Admit: 2021-02-27 | Discharge: 2021-02-27 | Disposition: A | Discharge status: Home / Self Care | Payer: No Typology Code available for payment source | Source: Ambulatory Visit | Attending: Infectious Diseases | Admitting: Infectious Diseases

## 2021-02-27 ENCOUNTER — Other Ambulatory Visit: Payer: Self-pay

## 2021-02-27 ENCOUNTER — Other Ambulatory Visit: Payer: No Typology Code available for payment source

## 2021-02-27 DIAGNOSIS — Z113 Encounter for screening for infections with a predominantly sexual mode of transmission: Secondary | ICD-10-CM | POA: Diagnosis present

## 2021-02-27 DIAGNOSIS — B20 Human immunodeficiency virus [HIV] disease: Secondary | ICD-10-CM | POA: Insufficient documentation

## 2021-02-27 NOTE — Addendum Note (Signed)
Addended by: Caffie Pinto on: 02/27/2021 11:00 AM   Modules accepted: Orders

## 2021-02-27 NOTE — Addendum Note (Signed)
Addended by: Caffie Pinto on: 02/27/2021 10:58 AM   Modules accepted: Orders

## 2021-03-02 LAB — COMPLETE METABOLIC PANEL WITH GFR
AG Ratio: 1.1 (calc) (ref 1.0–2.5)
ALT: 15 U/L (ref 9–46)
AST: 21 U/L (ref 10–35)
Albumin: 4.2 g/dL (ref 3.6–5.1)
Alkaline phosphatase (APISO): 81 U/L (ref 35–144)
BUN: 14 mg/dL (ref 7–25)
CO2: 28 mmol/L (ref 20–32)
Calcium: 9.2 mg/dL (ref 8.6–10.3)
Chloride: 102 mmol/L (ref 98–110)
Creat: 0.92 mg/dL (ref 0.70–1.25)
GFR, Est African American: 104 mL/min/{1.73_m2} (ref >=60)
GFR, Est Non African American: 90 mL/min/{1.73_m2} (ref >=60)
Globulin: 3.8 g/dL (calc) — ABNORMAL HIGH (ref 1.9–3.7)
Glucose, Bld: 94 mg/dL (ref 65–99)
Potassium: 4 mmol/L (ref 3.5–5.3)
Sodium: 138 mmol/L (ref 135–146)
Total Bilirubin: 0.4 mg/dL (ref 0.2–1.2)
Total Protein: 8 g/dL (ref 6.1–8.1)

## 2021-03-02 LAB — CBC WITH DIFFERENTIAL/PLATELET
Absolute Monocytes: 458 cells/uL (ref 200–950)
Basophils Absolute: 22 cells/uL (ref 0–200)
Basophils Relative: 0.5 %
Eosinophils Absolute: 70 cells/uL (ref 15–500)
Eosinophils Relative: 1.6 %
HCT: 41.8 % (ref 38.5–50.0)
Hemoglobin: 13.3 g/dL (ref 13.2–17.1)
Lymphs Abs: 2253 cells/uL (ref 850–3900)
MCH: 26.2 pg — ABNORMAL LOW (ref 27.0–33.0)
MCHC: 31.8 g/dL — ABNORMAL LOW (ref 32.0–36.0)
MCV: 82.4 fL (ref 80.0–100.0)
MPV: 7.9 fL (ref 7.5–12.5)
Monocytes Relative: 10.4 %
Neutro Abs: 1597 cells/uL (ref 1500–7800)
Neutrophils Relative %: 36.3 %
Platelets: 278 10*3/uL (ref 140–400)
RBC: 5.07 10*6/uL (ref 4.20–5.80)
RDW: 12.2 % (ref 11.0–15.0)
Total Lymphocyte: 51.2 %
WBC: 4.4 10*3/uL (ref 3.8–10.8)

## 2021-03-02 LAB — URINE CYTOLOGY ANCILLARY ONLY
Chlamydia: NEGATIVE
Comment: NEGATIVE
Comment: NORMAL
Neisseria Gonorrhea: NEGATIVE

## 2021-03-02 LAB — RPR: RPR Ser Ql: NONREACTIVE

## 2021-03-02 LAB — T-HELPER CELLS (CD4) COUNT (NOT AT ARMC)
Absolute CD4: 421 cells/uL — ABNORMAL LOW (ref 490–1740)
CD4 T Helper %: 18 % — ABNORMAL LOW (ref 30–61)
Total lymphocyte count: 2302 cells/uL (ref 850–3900)

## 2021-03-02 LAB — HIV-1 RNA QUANT-NO REFLEX-BLD
HIV 1 RNA Quant: 20700 Copies/mL — ABNORMAL HIGH
HIV-1 RNA Quant, Log: 4.32 Log cps/mL — ABNORMAL HIGH

## 2021-03-04 ENCOUNTER — Other Ambulatory Visit: Payer: Self-pay | Admitting: Family

## 2021-03-04 DIAGNOSIS — B20 Human immunodeficiency virus [HIV] disease: Secondary | ICD-10-CM

## 2021-03-17 ENCOUNTER — Other Ambulatory Visit: Payer: Self-pay

## 2021-03-17 ENCOUNTER — Ambulatory Visit: Payer: No Typology Code available for payment source

## 2021-03-17 ENCOUNTER — Ambulatory Visit: Payer: No Typology Code available for payment source | Admitting: Infectious Diseases

## 2021-03-20 ENCOUNTER — Ambulatory Visit: Payer: No Typology Code available for payment source

## 2021-03-25 ENCOUNTER — Telehealth: Payer: Self-pay

## 2021-03-25 ENCOUNTER — Other Ambulatory Visit: Payer: Self-pay | Admitting: Infectious Diseases

## 2021-03-25 DIAGNOSIS — B20 Human immunodeficiency virus [HIV] disease: Secondary | ICD-10-CM

## 2021-03-25 NOTE — Telephone Encounter (Signed)
Patient returning call, states he was going through a rough time and was missing doses frequently. He reports that he has been back on his Juluca/Prezcobix regularly for about one month. Reminded him of his upcoming follow up appointment and let him know that 1 refill has been sent. Patient verbalized understanding and has no further questions.   Beryle Flock, RN

## 2021-03-25 NOTE — Telephone Encounter (Signed)
Attempted to call patient after receiving refill request for Juluca/ Prezcobix. Last time medication was filled on 12/16/20 with 0 refills. Per pharmacy patient filled medication 1/19,2/15,3/16,6/22; pharmacy was not able to reach patient in April or May.  Is scheduled to follow up with provider on 8/5. Will send 30 day supply with one refill.  Will need to confirm with patient that he has is taking medication everyday without missing any doses. Left voicemail requesting call back. Leatrice Jewels, RMA

## 2021-04-01 ENCOUNTER — Telehealth: Payer: Self-pay

## 2021-04-01 NOTE — Telephone Encounter (Signed)
Patient is asking for a refill on his Keppra. Patient states he has only seen Dr. Margarita Rana once. Patient will also be trying to get back in with [facility] to see a provider.  Avriana Joo T Brooks Sailors

## 2021-04-03 ENCOUNTER — Other Ambulatory Visit: Payer: Self-pay

## 2021-04-03 ENCOUNTER — Other Ambulatory Visit: Payer: Self-pay | Admitting: Infectious Diseases

## 2021-04-03 DIAGNOSIS — G40909 Epilepsy, unspecified, not intractable, without status epilepticus: Secondary | ICD-10-CM

## 2021-04-03 MED ORDER — LEVETIRACETAM 750 MG PO TABS: 750.0000 mg | ORAL_TABLET | Freq: Two times a day (BID) | ORAL | 2 refills | Status: AC

## 2021-04-07 NOTE — Telephone Encounter (Signed)
Called and left patient a message on a secured voicemail letting him know refills for Keppra were sent and to follow up with his pcp as well. Isaac [name]

## 2021-04-10 ENCOUNTER — Ambulatory Visit: Payer: No Typology Code available for payment source | Admitting: Infectious Diseases

## 2021-04-16 ENCOUNTER — Encounter: Payer: Self-pay | Admitting: Infectious Diseases

## 2021-04-16 ENCOUNTER — Other Ambulatory Visit: Payer: Self-pay

## 2021-04-16 ENCOUNTER — Ambulatory Visit (INDEPENDENT_AMBULATORY_CARE_PROVIDER_SITE_OTHER): Payer: No Typology Code available for payment source | Admitting: Infectious Diseases

## 2021-04-16 VITALS — BP 127/84 | HR 91 | Temp 97.9°F | Wt 168.0 lb

## 2021-04-16 DIAGNOSIS — Z113 Encounter for screening for infections with a predominantly sexual mode of transmission: Secondary | ICD-10-CM

## 2021-04-16 DIAGNOSIS — F1994 Other psychoactive substance use, unspecified with psychoactive substance-induced mood disorder: Secondary | ICD-10-CM | POA: Diagnosis not present

## 2021-04-16 DIAGNOSIS — N529 Male erectile dysfunction, unspecified: Secondary | ICD-10-CM

## 2021-04-16 DIAGNOSIS — F17200 Nicotine dependence, unspecified, uncomplicated: Secondary | ICD-10-CM

## 2021-04-16 DIAGNOSIS — R569 Unspecified convulsions: Secondary | ICD-10-CM

## 2021-04-16 DIAGNOSIS — B20 Human immunodeficiency virus [HIV] disease: Secondary | ICD-10-CM

## 2021-04-16 DIAGNOSIS — Z79899 Other long term (current) drug therapy: Secondary | ICD-10-CM

## 2021-04-16 NOTE — Progress Notes (Signed)
   Subjective:    Patient ID: Isaac [name], male  DOB: [birth year], 60 y.o.        MRN: [id number]   HPI 60 yo M with hx of HIV+, Hep C (1a F0/F1 [11-2014]) treated and completed 03-2016, HTN, seizures and L frontal lobe AVM,  and ongoing  substance abuse (ETOH, drug addiction).    Has been on atripla --> descovey/tivicay ---> genvoya --> biktarvy (01-2019) --> genvoya (08-2019).  He had gamma knife surgery to his AVM on 04-2017.    Recent ED eval for drug use (crack, etoh). He is not sure what triggered him. Feels more optimistic now, has spiritual life, attending meetings.  He got help in June and restarted his ART. Prezcobix-Juluca. Was off art for several months.  He feels like it is still working.  Wt has gone back up.   Was seen 8-3 by neuro for sz d/o/. Staying on keppra.  No recent seizures.   HIV 1 RNA Quant (Copies/mL)  Date Value  02/27/2021 20,700 (H)  11/20/2020 Not Detected  06/03/2020 <20 (H)   CD4 T Cell Abs (/uL)  Date Value  11/20/2020 512  06/03/2020 540  04/08/2020 451     Health Maintenance  Topic Date Due  . Zoster Vaccines- Shingrix (1 of 2) Never done  . COLONOSCOPY (Pts 45-72yr Insurance coverage will need to be confirmed)  Never done  . COVID-19 Vaccine (3 - Mixed Product risk series) 05/04/2020  . INFLUENZA VACCINE  04/06/2021  . TETANUS/TDAP  01/13/2022  . Pneumococcal Vaccine [id number] old (4 - PPSV23 or PCV20) 12/14/2025  . Hepatitis C Screening  Completed  . HIV Screening  Completed  . HPV VACCINES  Aged Out      Review of Systems  Constitutional:  Negative for chills, fever and weight loss.  Respiratory:  Negative for cough and shortness of breath.   Cardiovascular:  Negative for chest pain.  Gastrointestinal:  Negative for constipation and diarrhea.  Genitourinary:  Negative for dysuria.  Neurological:  Negative for seizures.   Please see HPI. All other systems reviewed and negative.     Objective:  Physical Exam Vitals  reviewed.  Constitutional:      Appearance: Normal appearance. He is not ill-appearing or diaphoretic.  HENT:     Mouth/Throat:     Mouth: Mucous membranes are moist.     Pharynx: No oropharyngeal exudate.  Eyes:     Extraocular Movements: Extraocular movements intact.     Pupils: Pupils are equal, round, and reactive to light.  Cardiovascular:     Rate and Rhythm: Normal rate and regular rhythm.  Pulmonary:     Effort: Pulmonary effort is normal.     Breath sounds: Normal breath sounds.  Abdominal:     General: Bowel sounds are normal. There is no distension.     Palpations: Abdomen is soft.     Tenderness: There is no abdominal tenderness.  Musculoskeletal:        General: Normal range of motion.     Cervical back: Normal range of motion and neck supple.     Right lower leg: No edema.     Left lower leg: No edema.  Neurological:     General: No focal deficit present.     Mental Status: He is alert.          Assessment & Plan:

## 2021-04-16 NOTE — Assessment & Plan Note (Signed)
He is doing much better Encouraged him to stay on his path.

## 2021-04-16 NOTE — Assessment & Plan Note (Signed)
Encouraged to quit. 

## 2021-04-16 NOTE — Assessment & Plan Note (Signed)
Appears to be controlled.  Appreciate neuro f/u.

## 2021-04-16 NOTE — Assessment & Plan Note (Signed)
Has dental appt pending Offered/refused condoms.  Has gotten COVID vax Labs today rtc in 4 months.

## 2021-04-16 NOTE — Assessment & Plan Note (Signed)
Asks for samples (which we don't have) Offered/refused condoms.

## 2021-04-17 LAB — T-HELPER CELL (CD4) - (RCID CLINIC ONLY)
CD4 % Helper T Cell: 24 % — ABNORMAL LOW (ref 33–65)
CD4 T Cell Abs: 462 /uL (ref 400–1790)

## 2021-04-19 LAB — COMPREHENSIVE METABOLIC PANEL
AG Ratio: 1.2 (calc) (ref 1.0–2.5)
ALT: 20 U/L (ref 9–46)
AST: 29 U/L (ref 10–35)
Albumin: 4.3 g/dL (ref 3.6–5.1)
Alkaline phosphatase (APISO): 77 U/L (ref 35–144)
BUN: 10 mg/dL (ref 7–25)
CO2: 27 mmol/L (ref 20–32)
Calcium: 9.2 mg/dL (ref 8.6–10.3)
Chloride: 101 mmol/L (ref 98–110)
Creat: 0.87 mg/dL (ref 0.70–1.35)
Globulin: 3.6 g/dL (calc) (ref 1.9–3.7)
Glucose, Bld: 76 mg/dL (ref 65–99)
Potassium: 4 mmol/L (ref 3.5–5.3)
Sodium: 135 mmol/L (ref 135–146)
Total Bilirubin: 0.3 mg/dL (ref 0.2–1.2)
Total Protein: 7.9 g/dL (ref 6.1–8.1)

## 2021-04-19 LAB — HIV-1 RNA QUANT-NO REFLEX-BLD
HIV 1 RNA Quant: <20 Copies/mL — ABNORMAL HIGH
HIV-1 RNA Quant, Log: <1.3 Log cps/mL — ABNORMAL HIGH

## 2021-04-19 LAB — CBC
HCT: 37.7 % — ABNORMAL LOW (ref 38.5–50.0)
Hemoglobin: 11.9 g/dL — ABNORMAL LOW (ref 13.2–17.1)
MCH: 26.1 pg — ABNORMAL LOW (ref 27.0–33.0)
MCHC: 31.6 g/dL — ABNORMAL LOW (ref 32.0–36.0)
MCV: 82.7 fL (ref 80.0–100.0)
MPV: 8.2 fL (ref 7.5–12.5)
Platelets: 307 10*3/uL (ref 140–400)
RBC: 4.56 10*6/uL (ref 4.20–5.80)
RDW: 12.9 % (ref 11.0–15.0)
WBC: 4.6 10*3/uL (ref 3.8–10.8)

## 2021-04-19 LAB — HEPATITIS C RNA QUANTITATIVE
HCV Quantitative Log: <1.18 log IU/mL
HCV RNA, PCR, QN: <15 IU/mL

## 2021-04-19 LAB — RPR: RPR Ser Ql: NONREACTIVE

## 2021-04-20 ENCOUNTER — Telehealth: Payer: Self-pay

## 2021-04-20 ENCOUNTER — Other Ambulatory Visit: Payer: Self-pay | Admitting: Infectious Diseases

## 2021-04-20 DIAGNOSIS — N529 Male erectile dysfunction, unspecified: Secondary | ICD-10-CM

## 2021-04-20 MED ORDER — SILDENAFIL CITRATE 50 MG PO TABS: 50.0000 mg | ORAL_TABLET | Freq: Every day | ORAL | 1 refills | Status: AC | PRN

## 2021-04-20 NOTE — Telephone Encounter (Signed)
Received call today from [facility] requesting refill on Sildenafil. Patient is asking for increase dose to 50 mg. Will forward message to MD for new rx. Leatrice Jewels, RMA

## 2021-05-06 ENCOUNTER — Encounter: Payer: Self-pay | Admitting: Infectious Diseases

## 2021-05-18 ENCOUNTER — Other Ambulatory Visit: Payer: Self-pay | Admitting: Family Medicine

## 2021-05-18 ENCOUNTER — Other Ambulatory Visit: Payer: Self-pay | Admitting: Infectious Diseases

## 2021-05-18 ENCOUNTER — Other Ambulatory Visit: Payer: Self-pay | Admitting: *Deleted

## 2021-05-18 DIAGNOSIS — I10 Essential (primary) hypertension: Secondary | ICD-10-CM

## 2021-05-18 DIAGNOSIS — B20 Human immunodeficiency virus [HIV] disease: Secondary | ICD-10-CM

## 2021-05-18 DIAGNOSIS — G40909 Epilepsy, unspecified, not intractable, without status epilepticus: Secondary | ICD-10-CM

## 2021-05-18 NOTE — Telephone Encounter (Signed)
Requested medication (s) are due for refill today: yes  Requested medication (s) are on the active medication list: yes  Last refill:  06/24/20 #30 6 refills  Future visit scheduled: no  Notes to clinic:  no valid encounter within 6 months . Last encounter 10 months ago called patient to scheduled appt. Patient reports he is unable to schedule appt at this time but would like a call back tomorrow . Do you want to do courtesy refill Rx?     Requested Prescriptions  Pending Prescriptions Disp Refills   amLODipine (NORVASC) 10 MG tablet [Pharmacy Med Name: AMLODIPINE BESYLATE '10MG'$  TABLETS] 30 tablet 6    Sig: TAKE 1 TABLET(10 MG) BY MOUTH DAILY     Cardiovascular:  Calcium Channel Blockers Failed - 05/18/2021 12:09 PM      Failed - Valid encounter within last 6 months    Recent Outpatient Visits           10 months ago Sebaceous cyst   [place], [place], MD              Passed - Last BP in normal range    BP Readings from Last 1 Encounters:  04/16/21 127/84           benazepril-hydrochlorthiazide (LOTENSIN HCT) 10-12.5 MG tablet [Pharmacy Med Name: BENAZEPRIL/HCTZ 10/12.'5MG'$  TABLETS] 30 tablet 6    Sig: Take 1 tablet by mouth daily. For high blood pressure     Cardiovascular:  ACEI + Diuretic Combos Failed - 05/18/2021 12:09 PM      Failed - Valid encounter within last 6 months    Recent Outpatient Visits           10 months ago Sebaceous cyst   [place], Charlane Ferretti, MD              Passed - Na in normal range and within 180 days    Sodium  Date Value Ref Range Status  04/16/2021 135 135 - 146 mmol/L Final          Passed - K in normal range and within 180 days    Potassium  Date Value Ref Range Status  04/16/2021 4.0 3.5 - 5.3 mmol/L Final          Passed - Cr in normal range and within 180 days    Creat  Date Value Ref Range Status  04/16/2021 0.87 0.70 - 1.35 mg/dL Final           Passed - Ca in normal range and within 180 days    Calcium  Date Value Ref Range Status  04/16/2021 9.2 8.6 - 10.3 mg/dL Final   Calcium, Ion  Date Value Ref Range Status  10/14/2009 1.07 (L) 1.12 - 1.32 mmol/L Final          Passed - Patient is not pregnant      Passed - Last BP in normal range    BP Readings from Last 1 Encounters:  04/16/21 127/84

## 2021-05-19 ENCOUNTER — Other Ambulatory Visit: Payer: Self-pay | Admitting: Family Medicine

## 2021-05-19 DIAGNOSIS — I10 Essential (primary) hypertension: Secondary | ICD-10-CM

## 2021-05-19 MED ORDER — BENAZEPRIL-HYDROCHLOROTHIAZIDE 10-12.5 MG PO TABS: 1.0000 | ORAL_TABLET | Freq: Every day | ORAL | 0 refills | Status: AC

## 2021-05-19 MED ORDER — AMLODIPINE BESYLATE 10 MG PO TABS: 10.0000 mg | ORAL_TABLET | Freq: Every day | ORAL | 0 refills | Status: AC

## 2021-05-19 NOTE — Telephone Encounter (Signed)
Copied from [place] [id number]. Topic: Quick Communication - Rx Refill/Question >> May 19, 2021 11:33 AM Tessa Lerner A wrote: Medication: amLODipine (NORVASC) 10 MG tablet [id number]   benazepril-hydrochlorthiazide (LOTENSIN HCT) 10-12.5 MG tablet [id number]   Has the patient contacted their pharmacy? Yes.   (Agent: If no, request that the patient contact the pharmacy for the refill.) (Agent: If yes, when and what did the pharmacy advise?)  Preferred Pharmacy (with phone number or street name): [place], [place] [facility] [place] [address] Phone: [phone] Fax: [phone] Hours: Not open 24 hours  Agent: Please be advised that RX refills may take up to 3 business days. We ask that you follow-up with your pharmacy.

## 2021-05-19 NOTE — Telephone Encounter (Signed)
Requested medications are due for refill today.  yes  Requested medications are on the active medications list.  yes  Last refill. 04/03/2021  Future visit scheduled.   no  Notes to clinic.  Medication not delegated.

## 2021-05-19 NOTE — Telephone Encounter (Signed)
Call to patient- appointment scheduled 06/15/21- courtesy 30 day RF given

## 2021-05-29 ENCOUNTER — Telehealth: Payer: Self-pay

## 2021-05-29 NOTE — Telephone Encounter (Signed)
Attempted to call patient after receiving PA for Sildenafil Citrate 100 mg tabs. Will need to inform patient that PA is not necessary from insurance. If patient needs assistance with copay will need to use either GoodRx card or [facility].  Leatrice Jewels, RMA

## 2021-06-15 ENCOUNTER — Telehealth: Payer: Self-pay

## 2021-06-15 ENCOUNTER — Ambulatory Visit: Payer: No Typology Code available for payment source | Admitting: Family Medicine

## 2021-06-15 NOTE — Telephone Encounter (Signed)
PA for Sidenafil approve for the patient 4 tabs per 30 days 05/30/21-06/13/22. Approval has been faxed to the pharmacy for the patient . Ref# [id number] Alayza Pieper T Brooks Sailors

## 2021-06-16 NOTE — Telephone Encounter (Signed)
PA was sent to plan 06/12/21.  Beryle Flock, RN

## 2021-06-22 ENCOUNTER — Other Ambulatory Visit: Payer: Self-pay | Admitting: Family Medicine

## 2021-06-22 DIAGNOSIS — I10 Essential (primary) hypertension: Secondary | ICD-10-CM

## 2021-06-22 NOTE — Telephone Encounter (Signed)
Requested medications are due for refill today.  yes  Requested medications are on the active medications list.  yes  Last refill. 05/19/2021  Future visit scheduled.   no  Notes to clinic.  Pt is more than 3 months overdue for office visit. Pt already given 30 day courtesy refill.

## 2021-06-23 ENCOUNTER — Emergency Department (HOSPITAL_COMMUNITY)
Admission: EM | Admit: 2021-06-23 | Discharge: 2021-06-23 | Disposition: A | Discharge status: Home / Self Care | Payer: No Typology Code available for payment source | Attending: Emergency Medicine | Admitting: Emergency Medicine

## 2021-06-23 ENCOUNTER — Encounter (HOSPITAL_COMMUNITY): Payer: Self-pay | Admitting: Emergency Medicine

## 2021-06-23 DIAGNOSIS — F1090 Alcohol use, unspecified, uncomplicated: Secondary | ICD-10-CM | POA: Diagnosis not present

## 2021-06-23 DIAGNOSIS — F1910 Other psychoactive substance abuse, uncomplicated: Secondary | ICD-10-CM | POA: Diagnosis present

## 2021-06-23 DIAGNOSIS — Z5900 Homelessness unspecified: Secondary | ICD-10-CM | POA: Diagnosis not present

## 2021-06-23 DIAGNOSIS — I10 Essential (primary) hypertension: Secondary | ICD-10-CM | POA: Insufficient documentation

## 2021-06-23 DIAGNOSIS — Z79899 Other long term (current) drug therapy: Secondary | ICD-10-CM | POA: Diagnosis not present

## 2021-06-23 DIAGNOSIS — F17210 Nicotine dependence, cigarettes, uncomplicated: Secondary | ICD-10-CM | POA: Diagnosis not present

## 2021-06-23 DIAGNOSIS — Z21 Asymptomatic human immunodeficiency virus [HIV] infection status: Secondary | ICD-10-CM | POA: Diagnosis not present

## 2021-06-23 HISTORY — DX: Alcohol abuse, uncomplicated: F10.10

## 2021-06-23 HISTORY — DX: Other psychoactive substance dependence, uncomplicated: F19.20

## 2021-06-23 LAB — RAPID URINE DRUG SCREEN, HOSP PERFORMED
Amphetamines: NOT DETECTED
Barbiturates: NOT DETECTED
Benzodiazepines: POSITIVE — AB
Cocaine: POSITIVE — AB
Opiates: NOT DETECTED
Tetrahydrocannabinol: NOT DETECTED

## 2021-06-23 LAB — CBC
HCT: 35.6 % — ABNORMAL LOW (ref 39.0–52.0)
Hemoglobin: 10.9 g/dL — ABNORMAL LOW (ref 13.0–17.0)
MCH: 26.5 pg (ref 26.0–34.0)
MCHC: 30.6 g/dL (ref 30.0–36.0)
MCV: 86.6 fL (ref 80.0–100.0)
Platelets: 358 10*3/uL (ref 150–400)
RBC: 4.11 MIL/uL — ABNORMAL LOW (ref 4.22–5.81)
RDW: 12.6 % (ref 11.5–15.5)
WBC: 8.2 10*3/uL (ref 4.0–10.5)
nRBC: 0 % (ref 0.0–0.2)

## 2021-06-23 LAB — COMPREHENSIVE METABOLIC PANEL
ALT: 31 U/L (ref 0–44)
AST: 48 U/L — ABNORMAL HIGH (ref 15–41)
Albumin: 3.7 g/dL (ref 3.5–5.0)
Alkaline Phosphatase: 61 U/L (ref 38–126)
Anion gap: 11 (ref 5–15)
BUN: 13 mg/dL (ref 6–20)
CO2: 27 mmol/L (ref 22–32)
Calcium: 8.8 mg/dL — ABNORMAL LOW (ref 8.9–10.3)
Chloride: 98 mmol/L (ref 98–111)
Creatinine, Ser: 0.94 mg/dL (ref 0.61–1.24)
GFR, Estimated: >60 mL/min (ref >60)
Glucose, Bld: 110 mg/dL — ABNORMAL HIGH (ref 70–99)
Potassium: 3.7 mmol/L (ref 3.5–5.1)
Sodium: 136 mmol/L (ref 135–145)
Total Bilirubin: 0.7 mg/dL (ref 0.3–1.2)
Total Protein: 7.4 g/dL (ref 6.5–8.1)

## 2021-06-23 LAB — CBG MONITORING, ED: Glucose-Capillary: 101 mg/dL — ABNORMAL HIGH (ref 70–99)

## 2021-06-23 LAB — ETHANOL: Alcohol, Ethyl (B): <10 mg/dL (ref <10)
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
## 2021-06-23 NOTE — ED Triage Notes (Signed)
Pt. Stated, I had cocaine , Meth, and alcohol this morning at 200. Last ate yesterday around 0800. Im just out of control. Im homeless

## 2021-06-23 NOTE — Social Work (Signed)
CSW met with Pt at bedside. Pt states that he needs detox and treatment due to being homeless. CSW explained to Pt that detox is a medical need and not an appropriate way to find emergency housing. Pt states that he understands this distinction. Pt endorses using alcohol daily, when asked amounts he reports "as much as I can get" but did not give specifics. Pt also endorses smoking cocaine daily. Pt states last use was yesterday.  Pt states that he was sent to detox in [place] last week but left. CSW offered phone list for Pt to call as Pt does have phone, Pt declined. CSW offered outpatient resources but Pt declined stating that he needed a place to stay.  Pt also declined 12-step resources.

## 2021-06-23 NOTE — ED Notes (Signed)
Pt refused vital signs.

## 2021-06-23 NOTE — ED Triage Notes (Signed)
Pt. Stated, Im having problem with alcohol and drug problem  and have lost my job and need help to be able to live.

## 2021-06-24 NOTE — Telephone Encounter (Signed)
Pharmacy called requesting correspondence for refill request.

## 2021-06-25 ENCOUNTER — Other Ambulatory Visit: Payer: Self-pay | Admitting: Family Medicine

## 2021-06-25 DIAGNOSIS — I10 Essential (primary) hypertension: Secondary | ICD-10-CM

## 2021-06-25 NOTE — Telephone Encounter (Signed)
Requested medications are due for refill today.  yes  Requested medications are on the active medications list.  yes  Last refill. 05/19/2021  Future visit scheduled.   no  Notes to clinic.  Pt is more than 3 months over due for office visit.

## 2021-06-30 ENCOUNTER — Telehealth: Payer: Self-pay

## 2021-06-30 NOTE — Telephone Encounter (Signed)
Received call from [facility], they have been unable to get in touch with the patient to schedule his medication delivery.   RN called patient, no answer and voicemail full.   Beryle Flock, RN

## 2021-08-18 ENCOUNTER — Ambulatory Visit: Payer: No Typology Code available for payment source | Admitting: Infectious Diseases

## 2021-09-14 ENCOUNTER — Telehealth: Payer: Self-pay | Admitting: *Deleted

## 2021-09-14 NOTE — Telephone Encounter (Signed)
Copied from [place] [id number]. Topic: Appointment Scheduling - Scheduling Inquiry for Clinic >> Sep 14, 2021 11:27 AM Valere Dross wrote: Reason for CRM: Pts friend called in stating the pt was needing a hospital f/u appt from being seen for seizures, there were no available appts until February and pt was needing to be seen sooner, and was wondering if there was anyway he could be squeezed in a little sooner, please advise.   ____________________________________  Left message on voicemail to schedule an apt. Prior to February per request.

## 2021-10-02 ENCOUNTER — Telehealth: Payer: Self-pay

## 2021-10-02 NOTE — Telephone Encounter (Signed)
Transition Care Management Unsuccessful Follow-up Telephone Call  Date of discharge and from where:  09/16/2021  [facility]  Attempts:  1st Attempt  Reason for unsuccessful TCM follow-up call:  Missing or invalid number Tomasa Rand, RN, BSN, [facility] [employer] [phone]

## 2021-10-05 ENCOUNTER — Other Ambulatory Visit: Payer: Self-pay | Admitting: Infectious Diseases

## 2021-10-05 DIAGNOSIS — B20 Human immunodeficiency virus [HIV] disease: Secondary | ICD-10-CM

## 2021-11-02 ENCOUNTER — Other Ambulatory Visit (HOSPITAL_COMMUNITY): Payer: Self-pay

## 2022-02-15 ENCOUNTER — Encounter: Payer: Self-pay | Admitting: Infectious Diseases
